# Patient Record
Sex: Male | Born: 1945
Health system: Southern US, Community
[De-identification: ages and names within clinical notes are randomized; demographics above are authoritative.]

## PROBLEM LIST (undated history)

## (undated) DIAGNOSIS — J189 Pneumonia, unspecified organism: Secondary | ICD-10-CM

## (undated) DIAGNOSIS — M109 Gout, unspecified: Secondary | ICD-10-CM

## (undated) DIAGNOSIS — G4730 Sleep apnea, unspecified: Secondary | ICD-10-CM

## (undated) DIAGNOSIS — E119 Type 2 diabetes mellitus without complications: Secondary | ICD-10-CM

## (undated) DIAGNOSIS — H409 Unspecified glaucoma: Secondary | ICD-10-CM

## (undated) DIAGNOSIS — M1990 Unspecified osteoarthritis, unspecified site: Secondary | ICD-10-CM

## (undated) DIAGNOSIS — K279 Peptic ulcer, site unspecified, unspecified as acute or chronic, without hemorrhage or perforation: Secondary | ICD-10-CM

## (undated) DIAGNOSIS — K760 Fatty (change of) liver, not elsewhere classified: Secondary | ICD-10-CM

## (undated) DIAGNOSIS — I10 Essential (primary) hypertension: Secondary | ICD-10-CM

## (undated) DIAGNOSIS — C801 Malignant (primary) neoplasm, unspecified: Secondary | ICD-10-CM

## (undated) HISTORY — DX: Malignant (primary) neoplasm, unspecified: C80.1

## (undated) HISTORY — DX: Essential (primary) hypertension: I10

## (undated) HISTORY — PX: PROSTATE SURGERY: SHX751

## (undated) HISTORY — DX: Gout, unspecified: M10.9

## (undated) HISTORY — PX: TONSILLECTOMY: SUR1361

## (undated) HISTORY — DX: Peptic ulcer, site unspecified, unspecified as acute or chronic, without hemorrhage or perforation: K27.9

---

## 2007-08-23 ENCOUNTER — Encounter (HOSPITAL_COMMUNITY): Admission: RE | Admit: 2007-08-23 | Discharge: 2007-10-24 | Payer: Self-pay | Admitting: Urology

## 2007-09-14 ENCOUNTER — Inpatient Hospital Stay (HOSPITAL_COMMUNITY): Admission: RE | Admit: 2007-09-14 | Discharge: 2007-09-19 | Payer: Self-pay | Admitting: Urology

## 2007-09-14 ENCOUNTER — Encounter (INDEPENDENT_AMBULATORY_CARE_PROVIDER_SITE_OTHER): Payer: Self-pay | Admitting: Urology

## 2010-07-08 NOTE — Op Note (Signed)
Todd Gates, Todd Gates                ACCOUNT NO.:  [id number]   MEDICAL RECORD NO.:  [id number]          PATIENT TYPE:  INP   LOCATION:  0003                         FACILITY:  [facility]   PHYSICIAN:  Valetta Fuller, M.D.  DATE OF BIRTH:  May 18, 1945   DATE OF PROCEDURE:  09/14/2007  DATE OF DISCHARGE:                               OPERATIVE REPORT   PREOPERATIVE DIAGNOSIS:  Clinical stage T1C adenocarcinoma of the  prostate, intermediate to high risk.   POSTOPERATIVE DIAGNOSIS:  Clinical stage T1C adenocarcinoma of the  prostate, intermediate to high risk.   PROCEDURE PERFORMED:  Robotic assisted laparoscopic radical retropubic  prostatectomy with bilateral pelvic lymph node dissection.   SURGEON:  Valetta Fuller, M.D.   ASSISTANT:  Dr. Gaynelle Arabian.   ANESTHESIA:  General endotracheal.   INDICATIONS:  Todd Gates is a 65 year old male.  He originally saw Dr.  Larey Dresser in our office approximately 6 months ago.  At that time,  he had a palpable abnormality in the right lobe of his prostate.  This  was suspicious for adenocarcinoma of the prostate.  The patient  requested that diagnostic studies be done at the [place] because of cost  concerns.  He was sent to the [facility] clinic.  There, a PSA was  elevated in the 15-20 range.  An ultrasound and biopsy of his prostate  was performed.  The patient had low to moderate volume Gleason's 4 + 3 =  7 adenocarcinoma involving the right lobe of the prostate and low-volume  Gleason's 3 + 3 = 6 cancer involving the left lobe of his prostate.  He  went back to see Dr. Vonita Moss.  No additional imaging studies were done  at that time.  The patient was sent to me for consideration of robotic  prostatectomy.  We requested a bone scan be done which was done and did  not reveal any evidence of metastatic disease.  The patient underwent  extensive consultation with regard to treatment options for management  of this.  After careful discussion and  consideration, the patient  elected to proceed with robotic prostatectomy.  He appeared to  understand the advantages, disadvantages and complications associated  with this surgery.  He was felt to be potentially a candidate only for  unilateral nerve sparing on the left side and appeared to understand  that relatively high risk of sexual dysfunction.  We also discussed  incontinence issues.  He presents now for the procedure.   TECHNIQUE AND FINDINGS:  The patient had placement of compression boots  and received perioperative Unasyn.  He was brought to the operating room  where he had successful induction of general endotracheal anesthesia.  He was placed in a mid lithotomy position.  All extremities were  carefully padded.  The patient was secured to the operative table and  then placed in steep Trendelenburg position.  He was then prepped and  draped in the usual manner.  A Foley catheter was inserted sterilely on  the field.  Initial camera port incision was chosen just to the left of  the umbilicus  18 cm above the pubic symphysis and a standard open Hassan  technique was utilized.  No abdominal adhesions were palpated and trocar  placement and abdominal insufflation occurred without difficulty.  All  other trocars were placed with direct visual guidance.  This included 12  and 5 mm assist ports and three 8 mm robotic ports.  With positioning of  the ports, the surgical cart was then docked.   Bladder was distended for identification and the space of Retzius was  then developed utilizing electrocautery scissors.  Superficial fat off  the endopelvic fascia and bladder neck region, as well as the  superficial dorsal vein, were then dealt with.  Endopelvic fascia was  then incised from base to apex.  Levator musculature was swept off the  apex of the prostate and the dorsal venous complex was isolated and then  stapled with an ETS stapling device.  Hemostasis was excellent.  The   anterior bladder neck was identified with the aid of the Foley balloon  and electrocautery scissors were used to incise the anterior bladder  neck down to the Foley catheter which was then brought out of the  bladder neck and used for anterior retraction.  Indigo carmine was given  and we were well away from the ureteral orifices.  Posterior bladder  neck was then transected and the vas deferens and seminal vesicles were  identified.  These dissected free without obvious evidence of disease  outside the prostate.  All these structures were individually dissected  free and then retracted anteriorly.  Posterior plane between the rectum  and prostate was then established.   Attention was then turned towards nerve sparing.  This was done on the  left side only.  Superficial fascia on the prostate was incised and then  swept identifying the neurovascular bundle which was taken off from base  to apex of the prostate.  We then isolated the left-sided pedicle which  was taken with Hem-o-lok clips.  On the right side, we Hem-o-lok clips  to take as much tissue laterally as possible out to the apex of the  prostate.  The urethral stump was then transected, the Foley catheter  was removed and the posterior urethra transected.  The prostate specimen  was then brought out of the pelvis.  The pelvis was copiously irrigated.  The rectum was insufflated and no evidence of rectal injury was noted.   Attention was then turned towards bilateral pelvic lymph node  dissection.  Lymph node tissue within the obturator fossa extending up  to the bifurcation of the external iliac artery were taken.  Obturator  nerves were identified bilaterally as was the vasculature and the  obturator space.  Small clips were used for lymphatic channels and small  veins.  These packets were removed and will be examined for permanent  sectioning only.   Attention was then turned towards reconstruction.  The bladder neck did   not require reconstruction.  Posteriorly, the 6 o'clock bladder neck  position and posterior urethra were reapproximated utilizing interrupted  2-0 Vicryl suture.  The rest of the anastomosis was done with a double-  armed Monocryl suture in a 360 degree manner.  Foley catheter was  inserted and irrigation revealed no obvious extravasation.  The pelvic  drain was placed through one of the robotic ports and placed in the  retropubic space.  The prostate specimen was placed in an Endopouch bag.  The 12 mm assist port was closed with a 0 Vicryl  suture with the aid of  a suture passer.  All other trocars were taken out with direct vision.  The camera port incision was extended slightly to allow for removal of  the specimen and then closed with a #1 Vicryl suture.  All wounds were  infiltrated with Marcaine and then closed with clips.  The patient  appeared to tolerate the procedure well.   ESTIMATED BLOOD LOSS:  Around 200 mL.   COMPLICATIONS:  No obvious complications or problems occurred.   The patient was brought to the recovery room in stable condition.           ______________________________  Valetta Fuller, M.D.  Electronically Signed     DSG/MEDQ  D:  09/14/2007  T:  09/14/2007  Job:  [id number]

## 2010-07-11 NOTE — Discharge Summary (Signed)
NAMERASHAWD, LASKARIS                ACCOUNT NO.:  [id number]   MEDICAL RECORD NO.:  [id number]          PATIENT TYPE:  INP   LOCATION:  1434                         FACILITY:  [facility]   PHYSICIAN:  Valetta Fuller, M.D.  DATE OF BIRTH:  [birth year]   DATE OF ADMISSION:  09/14/2007  DATE OF DISCHARGE:  09/19/2007                               DISCHARGE SUMMARY   DISCHARGE DIAGNOSES:  1. Adenocarcinoma of the prostate.  2. Hypertension.  3. Temporary renal insufficiency.   PROCEDURE PERFORMED:  Robotic assisted laparoscopic radical retropubic  prostatectomy with bilateral pelvic lymph node dissection on September 14, 2007.   HOSPITAL COURSE:  [name] is a 65 year old male.  He was diagnosed  with higher risk clinical stage T1C adenocarcinoma of the prostate.  He  has had a PSA that has been elevated in the 15-20 range.  He was felt to  have moderate volume Gleason score + or = 7 cancer involving the right  lobe of his prostate and lower volume Gleason 6 disease involving the  left lobe of his prostate.  Metastatic workup was negative.  The patient  has undergone extensive counseling with regard to treatment options and  had chosen a robotic prostatectomy.  Preop assessment was otherwise  unremarkable.   On September 14, 2007 the patient underwent robotic assisted laparoscopic  radical retropubic prostatectomy with bilateral pelvic lymph node  dissections.  The surgery itself was unremarkable and there did not  appear to be any significant complications or problems.  Blood loss was  200 mL.  The patient's initial postoperative situation was relatively  unremarkable.  On postop day #1, however, he was noted to have  substantially reduced urinary output.  The patient's baseline creatinine  which had been normal at 1.2 was noted to be 3.1.  Hemoglobin was fairly  stable at 14.3.  There had not been good recording of urinary output on  the nurses record, but JP output appeared to be 100 mL.  The  patient  subsequently was given a fluid bolus.  He was monitored carefully over  the next 24 hours, but continued to have high outputs from his JP with  intermediate higher volume urine production out of the catheter.  His  creatinine continued to slowly rise.  Overall, the patient appeared to  have substantial urinary output.  It just appeared that at times the  majority of his urinary production appeared to be coming out of the JP,  indicating an anastomotic leak.  JP fluid was indeed found to be  consistent with urine based on creatinine levels.  The patient  clinically did well other than a mild ileus.  When the urinary output  decreased further from the Foley catheter, attempts at irrigation were  made with little return of the fluid.  There was some concern over the  possibility of a malpositioned catheter, but a CT cystogram showed the  Foley to be well positioned with some extravasation of contrast at the  bladder neck.  Slowly over time the urinary output started to come out  primarily from the Foley  catheter and the JP output continued to  decrease.  The patient's creatinine continued to improve.  Clinically he  began tolerating a liquid diet well.  He did have a prolonged ileus but  that subsequently resolved.  On postoperative day #5, the patient was  really without complaints.  He had a bowel movement and was tolerating a  general diet well.  His creatinine had improved to 1.6.  JP output at  that point became minimal and all his urinary output was coming out of  the Foley catheter.  At that point we felt it prudent to go ahead and  allow him to be discharged home.   DISPOSITION:  The patient was discharged home with an indwelling Foley  catheter after leg bag teaching.  He was instructed to resume a normal  diet with routine postoperative instructions given.  He was given a  prescription for pain medication.  He will follow up in our office in  approximately 10 days for  reassessment and sooner as required.      Valetta Fuller, M.D.  Electronically Signed     DSG/MEDQ  D:  11/09/2007  T:  11/10/2007  Job:  [id number]

## 2010-07-11 NOTE — H&P (Signed)
Todd Gates, Todd Gates                ACCOUNT NO.:  [id number]   MEDICAL RECORD NO.:  [id number]          PATIENT TYPE:  INP   LOCATION:  1434                         FACILITY:  [facility]   PHYSICIAN:  Valetta Fuller, M.D.  DATE OF BIRTH:  1946-01-03   DATE OF ADMISSION:  09/14/2007  DATE OF DISCHARGE:  09/19/2007                              HISTORY & PHYSICAL   ADMISSION DIAGNOSIS:  Adenocarcinoma of the prostate.   HISTORY OF PRESENT ILLNESS:  Todd Gates is a 65 year old male.  He  presents today for robotic assisted laparoscopic radical retropubic  prostatectomy and bilateral pelvic lymph node dissection.  The patient  has high risk clinical stage T1c adenocarcinoma of the prostate and will  hopefully be admitted for routine postoperative care status post that  procedure.   The patient was diagnosed originally with adenocarcinoma of the prostate  at the [place] [facility].  He had originally been seen by Dr. Larey Dresser  with an elevated PSA in the 15-20 range.  The patient eventually had  ultrasound and biopsy performed in the [place] system.  The patient had  moderate volume Gleason 4 plus 3 equals 7 tumor involving the right lobe  of the prostate and low volume Gleason 3 plus 3 equals 6 tumor involving  the left lobe of the prostate.  The patient had undergone extensive  discussion about treatment options.  He elected to proceed with a  robotic prostatectomy.  Preoperative bone scan failed to reveal any  metastatic disease.  The patient really has minimal voiding complaints  at this time.  He is otherwise essentially asymptomatic.   PAST MEDICAL HISTORY:  Is notable for hypertension.  The patient also  has a diagnosis of glaucoma.   PREOPERATIVE MEDICATIONS:  Include hydrochlorothiazide 25 mg p.o. daily.   ALLERGIES:  The patient had allergies or intolerances to ASPIRIN and  CONTRAST MEDIA.   SOCIAL HISTORY:  The patient is a nonsmoker.   REVIEW OF SYSTEMS:  Negative for fever, chills,  abdominal pain, bone  discomfort, shortness of breath, chest pain.   PHYSICAL EXAMINATION:  GENERAL:  Todd Gates is a well-developed, well-  nourished male.  NECK:  Showed no evidence of JVD or obvious masses.  LUNGS:  Respirations were normal effort with normal breath sounds.  HEART:  Regular rate and rhythm.  ABDOMEN:  Abdomen is slightly protuberant, soft and nontender.  No  palpable masses.  No hepatosplenomegaly.  EXTERNAL GENITALIA:  Shows normal penis, testes and adnexal structures.  RECTAL:  Normal tone.  No obvious masses.  Prostate 1+ without palpable  nodules.  EXTREMITIES:  Without edema.   DATA:  The patient's preoperative hemoglobin was 15.4 with a white blood  cell count of 7.4 thousand.  The patient's preoperative BUN and  creatinine are 13 and 1.2 respectively.   ASSESSMENT:  High risk clinical stage T1c adenocarcinoma of the  prostate.  The patient is to undergo robotic assisted laparoscopic  radical retropubic prostatectomy with bilateral lymph node dissection  today and hopefully will be admitted for routine postoperative care.      Todd Gates.  Isabel Caprice, M.D.  Electronically Signed     DSG/MEDQ  D:  11/09/2007  T:  11/10/2007  Job:  [id number]

## 2010-11-21 LAB — BASIC METABOLIC PANEL
BUN: 24 — ABNORMAL HIGH
BUN: 26 — ABNORMAL HIGH
BUN: 27 — ABNORMAL HIGH
CO2: 24
CO2: 24
CO2: 26
CO2: 29
CO2: 29
Calcium: 8.2 — ABNORMAL LOW
Calcium: 8.7
Calcium: 8.9
Calcium: 9.9
Chloride: 100
Chloride: 101
Chloride: 103
Chloride: 98
Creatinine, Ser: 1.18
Creatinine, Ser: 1.64 — ABNORMAL HIGH
Creatinine, Ser: 3.01 — ABNORMAL HIGH
Creatinine, Ser: 3.15 — ABNORMAL HIGH
GFR calc Af Amer: 24 — ABNORMAL LOW
GFR calc Af Amer: 26 — ABNORMAL LOW
GFR calc Af Amer: >60
GFR calc non Af Amer: 20 — ABNORMAL LOW
GFR calc non Af Amer: 43 — ABNORMAL LOW
Glucose, Bld: 125 — ABNORMAL HIGH
Glucose, Bld: 126 — ABNORMAL HIGH
Glucose, Bld: 129 — ABNORMAL HIGH
Glucose, Bld: 144 — ABNORMAL HIGH
Potassium: 4.3
Potassium: 4.3
Potassium: 4.8
Sodium: 133 — ABNORMAL LOW
Sodium: 135
Sodium: 135

## 2010-11-21 LAB — DIFFERENTIAL
Lymphocytes Relative: 10 — ABNORMAL LOW
Lymphs Abs: 1.3
Monocytes Relative: 9
Neutro Abs: 11 — ABNORMAL HIGH
Neutrophils Relative %: 81 — ABNORMAL HIGH

## 2010-11-21 LAB — ABO/RH: ABO/RH(D): O POS

## 2010-11-21 LAB — CBC
HCT: 38.9 — ABNORMAL LOW
Hemoglobin: 13.5
MCHC: 34.3
MCHC: 34.8
MCV: 91.5
RBC: 4.55
RBC: 4.9
RDW: 14.1
RDW: 14.3
WBC: 13.5 — ABNORMAL HIGH

## 2010-11-21 LAB — TYPE AND SCREEN: ABO/RH(D): O POS

## 2013-07-11 ENCOUNTER — Ambulatory Visit: Payer: Self-pay | Admitting: Orthopedic Surgery

## 2013-07-13 ENCOUNTER — Ambulatory Visit (INDEPENDENT_AMBULATORY_CARE_PROVIDER_SITE_OTHER): Payer: Medicare Other

## 2013-07-13 ENCOUNTER — Encounter: Payer: Self-pay | Admitting: Orthopedic Surgery

## 2013-07-13 ENCOUNTER — Ambulatory Visit (INDEPENDENT_AMBULATORY_CARE_PROVIDER_SITE_OTHER): Payer: Medicare Other | Admitting: Orthopedic Surgery

## 2013-07-13 VITALS — BP 127/66 | Ht 68.0 in | Wt 204.0 lb

## 2013-07-13 DIAGNOSIS — M25569 Pain in unspecified knee: Secondary | ICD-10-CM

## 2013-07-13 DIAGNOSIS — M25561 Pain in right knee: Secondary | ICD-10-CM

## 2013-07-13 DIAGNOSIS — M1712 Unilateral primary osteoarthritis, left knee: Secondary | ICD-10-CM | POA: Insufficient documentation

## 2013-07-13 DIAGNOSIS — IMO0002 Reserved for concepts with insufficient information to code with codable children: Secondary | ICD-10-CM

## 2013-07-13 DIAGNOSIS — M1710 Unilateral primary osteoarthritis, unspecified knee: Secondary | ICD-10-CM

## 2013-07-13 DIAGNOSIS — M179 Osteoarthritis of knee, unspecified: Secondary | ICD-10-CM

## 2013-07-13 MED ORDER — DICLOFENAC SODIUM 50 MG PO TBEC: 50.0000 mg | DELAYED_RELEASE_TABLET | Freq: Two times a day (BID) | ORAL | Status: DC

## 2013-07-13 NOTE — Patient Instructions (Signed)
Pick up meds from pharmacy   Osteoarthritis Osteoarthritis is a disease that causes soreness and swelling (inflammation) of a joint. It occurs when the cartilage at the affected joint wears down. Cartilage acts as a cushion, covering the ends of bones where they meet to form a joint. Osteoarthritis is the most common form of arthritis. It often occurs in older people. The joints affected most often by this condition include those in the:  Ends of the fingers.  Thumbs.  Neck.  Lower back.  Knees.  Hips. CAUSES  Over time, the cartilage that covers the ends of bones begins to wear away. This causes bone to rub on bone, producing pain and stiffness in the affected joints.  RISK FACTORS Certain factors can increase your chances of having osteoarthritis, including:  Older age.  Excessive body weight.  Overuse of joints. SIGNS AND SYMPTOMS   Pain, swelling, and stiffness in the joint.  Over time, the joint may lose its normal shape.  Small deposits of bone (osteophytes) may grow on the edges of the joint.  Bits of bone or cartilage can break off and float inside the joint space. This may cause more pain and damage. DIAGNOSIS  Your health care provider will do a physical exam and ask about your symptoms. Various tests may be ordered, such as:  X-rays of the affected joint.  An MRI scan.  Blood tests to rule out other types of arthritis.  Joint fluid tests. This involves using a needle to draw fluid from the joint and examining the fluid under a microscope. TREATMENT  Goals of treatment are to control pain and improve joint function. Treatment plans may include:  A prescribed exercise program that allows for rest and joint relief.  A weight control plan.  Pain relief techniques, such as:  Properly applied heat and cold.  Electric pulses delivered to nerve endings under the skin (transcutaneous electrical nerve stimulation, TENS).  Massage.  Certain nutritional  supplements.  Medicines to control pain, such as:  Acetaminophen.  Nonsteroidal anti-inflammatory drugs (NSAIDs), such as naproxen.  Narcotic or central-acting agents, such as tramadol.  Corticosteroids. These can be given orally or as an injection.  Surgery to reposition the bones and relieve pain (osteotomy) or to remove loose pieces of bone and cartilage. Joint replacement may be needed in advanced states of osteoarthritis. HOME CARE INSTRUCTIONS   Only take over-the-counter or prescription medicines as directed by your health care provider. Take all medicines exactly as instructed.  Maintain a healthy weight. Follow your health care provider's instructions for weight control. This may include dietary instructions.  Exercise as directed. Your health care provider can recommend specific types of exercise. These may include:  Strengthening exercises These are done to strengthen the muscles that support joints affected by arthritis. They can be performed with weights or with exercise bands to add resistance.  Aerobic activities These are exercises, such as brisk walking or low-impact aerobics, that get your heart pumping.  Range-of-motion activities These keep your joints limber.  Balance and agility exercises These help you maintain daily living skills.  Rest your affected joints as directed by your health care provider.  Follow up with your health care provider as directed. SEEK MEDICAL CARE IF:   Your skin turns red.  You develop a rash in addition to your joint pain.  You have worsening joint pain. SEEK IMMEDIATE MEDICAL CARE IF:  You have a significant loss of weight or appetite.  You have a fever along with  joint or muscle aches.  You have night sweats. [place] of Arthritis and Musculoskeletal and Skin Diseases: www.niams.SouthExposed.es [employer] on Aging: http://kim-miller.com/ American College of Rheumatology:  www.rheumatology.org Document Released: 02/09/2005 Document Revised: 11/30/2012 Document Reviewed: 10/17/2012 [facility] Patient Information 2014 [place], [place].

## 2013-07-13 NOTE — Progress Notes (Signed)
Patient ID: Todd Gates, male   DOB: [birth year], 68 y.o.   MRN: [id number]  Chief complaint pain right knee for several years  Previous treatment one cortisone injection and 1 steroid Dosepak.  The patient denies any trauma complained of aching and stiffness especially when driving with pain over the front of the knee. Denies catching locking giving way or mechanical symptoms  Vital signs: BP 127/66  Ht 5\' 8"  (1.727 m)  Wt 204 lb (92.534 kg)  BMI 31.03 kg/m2   General the patient is well-developed and well-nourished grooming and hygiene are normal Oriented x3 Mood and affect normal Ambulation normal; Inspection of the right knee  Range of motion test shows slight flexion contracture but normal flexion. There is no joint effusion. Mild tenderness over the medial joint line is noted. All joints are stable Motor exam is normal Skin clean dry and intact   Left knee inspection reveals no swelling, slight flexion contracture and range of motion test. Knee joint is stable. Muscle strength is 5 over 5.  Cardiovascular exam is normal Sensory exam normal   X-ray show mild to moderate arthritis of the knee  Impression osteoarthritis of the knee  Plan Start oral anti-inflammatories followup as needed no knee replacement needed at this time.  Meds ordered this encounter  Medications  . diclofenac (VOLTAREN) 50 MG EC tablet    Sig: Take 1 tablet (50 mg total) by mouth 2 (two) times daily.    Dispense:  60 tablet    Refill:  2

## 2014-03-08 LAB — HEPATIC FUNCTION PANEL
ALK PHOS: 82 U/L (ref 25–125)
ALT: 17 U/L (ref 10–40)
AST: 22 U/L (ref 14–40)
BILIRUBIN, TOTAL: 0.5 mg/dL

## 2015-03-18 LAB — HEPATIC FUNCTION PANEL
ALK PHOS: 78 U/L (ref 25–125)
ALK PHOS: 78 U/L (ref 25–125)
ALT: 16 U/L (ref 10–40)
ALT: 21 U/L (ref 10–40)
AST: 16 U/L (ref 14–40)
AST: 21 U/L (ref 14–40)
BILIRUBIN, TOTAL: 0.5 mg/dL
Bilirubin, Total: 0.5 mg/dL

## 2015-09-16 LAB — HEPATIC FUNCTION PANEL
ALK PHOS: 84 U/L (ref 25–125)
ALT: 20 U/L (ref 10–40)
AST: 29 U/L (ref 14–40)
Bilirubin, Total: 0.5 mg/dL

## 2015-12-04 ENCOUNTER — Ambulatory Visit (INDEPENDENT_AMBULATORY_CARE_PROVIDER_SITE_OTHER): Payer: Medicare Other | Admitting: Orthopedic Surgery

## 2015-12-04 DIAGNOSIS — M7662 Achilles tendinitis, left leg: Secondary | ICD-10-CM

## 2015-12-18 ENCOUNTER — Ambulatory Visit (INDEPENDENT_AMBULATORY_CARE_PROVIDER_SITE_OTHER): Payer: Medicare Other | Admitting: Family

## 2015-12-18 VITALS — Ht 68.0 in | Wt 214.0 lb

## 2015-12-18 DIAGNOSIS — M1A0720 Idiopathic chronic gout, left ankle and foot, without tophus (tophi): Secondary | ICD-10-CM | POA: Diagnosis not present

## 2015-12-18 DIAGNOSIS — M6702 Short Achilles tendon (acquired), left ankle: Secondary | ICD-10-CM

## 2015-12-18 DIAGNOSIS — M722 Plantar fascial fibromatosis: Secondary | ICD-10-CM

## 2015-12-18 NOTE — Progress Notes (Signed)
Office Visit Note   Patient: Todd Gates           Date of Birth: 01-08-1946           MRN: [id number] Visit Date: 12/18/2015              Requested by: Glenda Chroman, MD [address] [place], [place] [address] PCP: Glenda Chroman, MD   Assessment & Plan: Visit Diagnoses:  1. Plantar fasciitis of left foot   2. Contracture of left Achilles tendon   3. Idiopathic chronic gout of left ankle without tophus     Plan: Will obtain orthotics, recommended Sole orthotics for daily wear. Will work on heel cord stretching. Declined cortisone injection today. Will follow up in early December. Will consider injection if no improvement at that time.   Follow-Up Instructions: Return in about 5 weeks (around 01/24/2016).   Orders:  No orders of the defined types were placed in this encounter.  No orders of the defined types were placed in this encounter.     Procedures: No procedures performed   Clinical Data: No additional findings.   Subjective: Chief Complaint  Patient presents with  . Left Foot - Pain    Left heel pain pt has not had any gout flare since taking medication is on uloric 40mg  1 po qd and his last level was 5.7 on 09/17/15. He is in regular shoe wear and full wtb. Pt states that the [place] gave him a steroid does pack and that this helped but he is still tender.   Patient continues to have left heel pain which is posterior lateral. Gout continues to be well managed with [facility] [place] and colchicine for flares. He has previously been given orthotics from the [place], which he states he could not tolerate. Does relate having good relief of pain following a steroid Dosepak. Since completing this the pain has returned.  Review of Systems  Constitutional: Negative for chills and fever.     Objective: Vital Signs: Ht 5\' 8"  (1.727 m)   Wt 214 lb (97.1 kg)   BMI 32.54 kg/m   Physical Exam  Ortho Exam   Left foot: Is point tender to posterior lateral calcaneus. No pain or tenderness  across arch or insertion of plantar fascia. No pain with calcaneal squeeze. Does have a heel cord contracture with dorsiflexion to neutral. Ankle is nontender. Has good subtalar motion. Strong dorsalis pedis pulse  Specialty Comments:  No specialty comments available.  Imaging: No results found.   PMFS History: Patient Active Problem List   Diagnosis Date Noted  . OA (osteoarthritis) of knee 07/13/2013   Past Medical History:  Diagnosis Date  . Cancer [facility])    prostate  . Gout   . HTN (hypertension)   . Peptic ulcer disease     Family History  Problem Relation Age of Onset  . Hypertension      Past Surgical History:  Procedure Laterality Date  . PROSTATE SURGERY     Social History   Occupational History  . Not on file.   Social History Main Topics  . Smoking status: Never Smoker  . Smokeless tobacco: Not on file  . Alcohol use No  . Drug use: No  . Sexual activity: Not on file          Office Visit Note   Patient: Todd Gates           Date of Birth: 11-02-1945  MRN: [id number] Visit Date:               Requested by: Glenda Chroman, MD [address] [place], [place] [address] PCP: Glenda Chroman, MD   Assessment & Plan: Visit Diagnoses:  1. Plantar fasciitis   2. Plantar fasciitis of left foot   3. Contracture of left Achilles tendon   4. Idiopathic chronic gout of left ankle without tophus     Plan:   Follow-Up Instructions: Return in about 5 weeks (around 01/24/2016).   Orders:  No orders of the defined types were placed in this encounter.  No orders of the defined types were placed in this encounter.     Procedures: No procedures performed   Clinical Data: No additional findings.   Subjective: Chief Complaint  Patient presents with  . Left Foot - Pain    HPI  Review of Systems   Objective: Vital Signs: Ht 5\' 8"  (1.727 m)   Wt 214 lb (97.1 kg)   BMI 32.54 kg/m   Physical Exam  Ortho Exam  Specialty Comments:  No  specialty comments available.  Imaging: No results found.   PMFS History: Patient Active Problem List   Diagnosis Date Noted  . OA (osteoarthritis) of knee 07/13/2013   Past Medical History:  Diagnosis Date  . Cancer [facility])    prostate  . Gout   . HTN (hypertension)   . Peptic ulcer disease     Family History  Problem Relation Age of Onset  . Hypertension      Past Surgical History:  Procedure Laterality Date  . PROSTATE SURGERY     Social History   Occupational History  . Not on file.   Social History Main Topics  . Smoking status: Never Smoker  . Smokeless tobacco: Not on file  . Alcohol use No  . Drug use: No  . Sexual activity: Not on file

## 2015-12-24 ENCOUNTER — Telehealth: Payer: Self-pay | Admitting: Radiation Oncology

## 2015-12-24 NOTE — Telephone Encounter (Signed)
Patient would like to be seen in [place], referral forwarded to [facility] 12/24/15

## 2016-01-13 ENCOUNTER — Ambulatory Visit: Payer: Self-pay

## 2016-01-13 ENCOUNTER — Ambulatory Visit: Payer: Self-pay | Admitting: Radiation Oncology

## 2016-01-22 ENCOUNTER — Ambulatory Visit (INDEPENDENT_AMBULATORY_CARE_PROVIDER_SITE_OTHER): Payer: Medicare Other | Admitting: Family

## 2016-01-22 ENCOUNTER — Encounter (INDEPENDENT_AMBULATORY_CARE_PROVIDER_SITE_OTHER): Payer: Self-pay | Admitting: Family

## 2016-01-22 VITALS — Ht 68.0 in | Wt 214.0 lb

## 2016-01-22 DIAGNOSIS — M722 Plantar fascial fibromatosis: Secondary | ICD-10-CM

## 2016-01-22 NOTE — Progress Notes (Signed)
Office Visit Note   Patient: Todd Gates           Date of Birth: 1945-10-23           MRN: [id number] Visit Date: 01/22/2016              Requested by: Glenda Chroman, MD [address] [place], [place] [address] PCP: Glenda Chroman, MD   Assessment & Plan: Visit Diagnoses:  1. Plantar fasciitis     Plan: Plan to follow up in 4 more weeks if continued pain.  Follow-Up Instructions: Return in about 4 weeks (around 02/19/2016).   Orders:  Orders Placed This Encounter  Procedures  . Foot Injection   No orders of the defined types were placed in this encounter.     Procedures: Foot Inj Date/Time: 01/23/2016 9:57 AM Performed by: Dondra Prader RENEE Authorized by: Dondra Prader RENEE   Consent Given by:  Patient Site marked: the procedure site was marked   Timeout: prior to procedure the correct patient, procedure, and site was verified   Indications:  Fasciitis and pain Condition: Plantar Fasciitis   Location: left plantar fascia muscle   Prep: patient was prepped and draped in usual sterile fashion   Needle Size:  22 G Medications:  2 mL lidocaine 1 %; 40 mg methylPREDNISolone acetate 40 MG/ML Patient Tolerance:  Patient tolerated the procedure well with no immediate complications     Clinical Data: No additional findings.   Subjective: Chief Complaint  Patient presents with  . Left Foot - Pain    Left heel pain    Patient is a 70 year old gentleman seen today in follow-up for his plantar fasciitis left heel. Has tried custom orthotics and stretching. Has also attempted NSAIDs with minimal relief. States is ready to try steroid injection. Continues to have pain which is worse with start up. Is also worse morning.    Review of Systems  Constitutional: Negative for chills and fever.     Objective: Vital Signs: Ht 5\' 8"  (1.727 m)   Wt 214 lb (97.1 kg)   BMI 32.54 kg/m   Physical Exam  Constitutional: He is oriented to person, place, and time. He appears  well-developed and well-nourished.  Pulmonary/Chest: Effort normal.  Musculoskeletal:  Pain in the arch. Tenderness at the origin of plantar fascia. Does have mild tenderness with calcaneal squeeze. Achilles is nontender. No palpable cords or defects. Full dorsiflexion  Neurological: He is alert and oriented to person, place, and time.  Psychiatric: He has a normal mood and affect.  Nursing note reviewed.   Ortho Exam  Specialty Comments:  No specialty comments available.  Imaging: No results found.   PMFS History: Patient Active Problem List   Diagnosis Date Noted  . Plantar fasciitis 01/23/2016  . OA (osteoarthritis) of knee 07/13/2013   Past Medical History:  Diagnosis Date  . Cancer [facility])    prostate  . Gout   . HTN (hypertension)   . Peptic ulcer disease     Family History  Problem Relation Age of Onset  . Hypertension      Past Surgical History:  Procedure Laterality Date  . PROSTATE SURGERY     Social History   Occupational History  . Not on file.   Social History Main Topics  . Smoking status: Never Smoker  . Smokeless tobacco: Not on file  . Alcohol use No  . Drug use: No  . Sexual activity: Not on file

## 2016-01-23 DIAGNOSIS — M722 Plantar fascial fibromatosis: Secondary | ICD-10-CM | POA: Diagnosis not present

## 2016-01-23 MED ORDER — LIDOCAINE HCL 1 % IJ SOLN
2.0000 mL | INTRAMUSCULAR | Status: AC | PRN
  Administered 2016-01-23: 2 mL

## 2016-01-23 MED ORDER — METHYLPREDNISOLONE ACETATE 40 MG/ML IJ SUSP
40.0000 mg | INTRAMUSCULAR | Status: AC | PRN
  Administered 2016-01-23: 40 mg

## 2016-02-21 ENCOUNTER — Ambulatory Visit (INDEPENDENT_AMBULATORY_CARE_PROVIDER_SITE_OTHER): Payer: Medicare Other | Admitting: Family

## 2016-02-21 ENCOUNTER — Ambulatory Visit (INDEPENDENT_AMBULATORY_CARE_PROVIDER_SITE_OTHER): Payer: Medicare Other | Admitting: Orthopedic Surgery

## 2016-02-21 ENCOUNTER — Encounter (INDEPENDENT_AMBULATORY_CARE_PROVIDER_SITE_OTHER): Payer: Self-pay | Admitting: Orthopedic Surgery

## 2016-02-21 DIAGNOSIS — M6702 Short Achilles tendon (acquired), left ankle: Secondary | ICD-10-CM | POA: Diagnosis not present

## 2016-02-21 MED ORDER — LIDOCAINE HCL 1 % IJ SOLN
2.0000 mL | INTRAMUSCULAR | Status: AC | PRN
  Administered 2016-02-21: 2 mL

## 2016-02-21 MED ORDER — METHYLPREDNISOLONE ACETATE 40 MG/ML IJ SUSP
40.0000 mg | INTRAMUSCULAR | Status: AC | PRN
  Administered 2016-02-21: 40 mg via INTRA_ARTICULAR

## 2016-02-21 NOTE — Progress Notes (Signed)
Office Visit Note   Patient: Todd Gates           Date of Birth: 02-15-1946           MRN: [id number] Visit Date: 02/21/2016              Requested by: Glenda Chroman, MD [address] [place], [place] [address] PCP: Glenda Chroman, MD   Assessment & Plan: Visit Diagnoses:  1. Achilles tendon contracture, left    Insertional left Achilles tendinitis Plan: Patient's retro-Achilles bursa was injected with 1 mL Depo-Medrol 40 mg and 1 mL of 1% lidocaine plain and he tolerated this well patient was given instructions for heel cord stretching to do daily. Follow-up as needed.  Follow-Up Instructions: Return if symptoms worsen or fail to improve.   Orders:  No orders of the defined types were placed in this encounter.  No orders of the defined types were placed in this encounter.     Procedures: Medium Joint Inj Date/Time: 02/21/2016 11:16 AM Performed by: DUDA, MARCUS V Authorized by: Newt Minion   Consent Given by:  Patient Site marked: the procedure site was marked   Timeout: prior to procedure the correct patient, procedure, and site was verified   Indications:  Pain and diagnostic evaluation Location:  Ankle Site:  L ankle Prep: patient was prepped and draped in usual sterile fashion   Needle Size:  22 G Needle Length:  1.5 inches Approach:  Posterior Ultrasound Guided: No   Fluoroscopic Guidance: No   Medications:  2 mL lidocaine 1 %; 40 mg methylPREDNISolone acetate 40 MG/ML Aspiration Attempted: No   Patient tolerance:  Patient tolerated the procedure well with no immediate complications     Clinical Data: No additional findings.   Subjective: Chief Complaint  Patient presents with  . Left Ankle - Follow-up    Plantar fascia pain follow up    Patient presents for follow up right plantar fascia pain. He is status post steroid injection 01/22/16. He did get some relief and wants to know if he can get a repeat injection today. He has tender and soreness medial  right heel.     Review of Systems   Objective: Vital Signs: There were no vitals taken for this visit.  Physical Exam examination patient is alert oriented no adenopathy well-dressed normal affect normal respiratory effort he does have an antalgic gait. Patient has good pulses. Patient has dorsiflexion to neutral with his knee extended. Patient's plantar fascia is nontender to palpation the insertion of the Achilles is tender to palpation and this is where patient is symptomatic. The peroneal posterior tibial tendon are nontender to palpation.  Ortho Exam  Specialty Comments:  No specialty comments available.  Imaging: No results found.   PMFS History: Patient Active Problem List   Diagnosis Date Noted  . Achilles tendon contracture, left 02/21/2016  . Plantar fasciitis 01/23/2016  . OA (osteoarthritis) of knee 07/13/2013   Past Medical History:  Diagnosis Date  . Cancer [facility])    prostate  . Gout   . HTN (hypertension)   . Peptic ulcer disease     Family History  Problem Relation Age of Onset  . Hypertension      Past Surgical History:  Procedure Laterality Date  . PROSTATE SURGERY     Social History   Occupational History  . Not on file.   Social History Main Topics  . Smoking status: Never Smoker  . Smokeless tobacco: Not on  file  . Alcohol use No  . Drug use: No  . Sexual activity: Not on file

## 2016-04-22 ENCOUNTER — Ambulatory Visit (INDEPENDENT_AMBULATORY_CARE_PROVIDER_SITE_OTHER): Payer: Medicare Other

## 2016-04-22 ENCOUNTER — Ambulatory Visit (INDEPENDENT_AMBULATORY_CARE_PROVIDER_SITE_OTHER): Payer: Medicare Other | Admitting: Family

## 2016-04-22 ENCOUNTER — Encounter (INDEPENDENT_AMBULATORY_CARE_PROVIDER_SITE_OTHER): Payer: Self-pay | Admitting: Family

## 2016-04-22 VITALS — Ht 68.0 in | Wt 214.0 lb

## 2016-04-22 DIAGNOSIS — M722 Plantar fascial fibromatosis: Secondary | ICD-10-CM | POA: Diagnosis not present

## 2016-04-22 DIAGNOSIS — M79672 Pain in left foot: Secondary | ICD-10-CM | POA: Diagnosis not present

## 2016-04-22 MED ORDER — METHYLPREDNISOLONE ACETATE 40 MG/ML IJ SUSP
40.0000 mg | INTRAMUSCULAR | Status: AC | PRN
  Administered 2016-04-22: 40 mg

## 2016-04-22 MED ORDER — LIDOCAINE HCL 1 % IJ SOLN
2.0000 mL | INTRAMUSCULAR | Status: AC | PRN
  Administered 2016-04-22: 2 mL

## 2016-04-22 NOTE — Progress Notes (Deleted)
   Office Visit Note   Patient: Todd Gates           Date of Birth: 03-17-1945           MRN: [id number] Visit Date: 04/22/2016              Requested by: Glenda Chroman, MD [address] [place], [place] [address] PCP: Glenda Chroman, MD  Chief Complaint  Patient presents with  . Left Foot - Pain    Heel pain    HPI: Left heel pain. The pt states that he has had pain for several months and that it is a bone spur. Patient states that he has had an injection in his heel in the past and that this was helpful and that he would like an a injection today. Pamella Pert, RMA    Assessment & Plan: Visit Diagnoses: No diagnosis found.  Plan: ***  Follow-Up Instructions: No Follow-up on file.   Ortho Exam ***  Imaging: No results found.  Orders:  No orders of the defined types were placed in this encounter.  No orders of the defined types were placed in this encounter.    Procedures: No procedures performed  Clinical Data: No additional findings.  Subjective: Review of Systems  Objective: Vital Signs: Ht 5\' 8"  (1.727 m)   Wt 214 lb (97.1 kg)   BMI 32.54 kg/m   Specialty Comments:  No specialty comments available.  PMFS History: Patient Active Problem List   Diagnosis Date Noted  . Achilles tendon contracture, left 02/21/2016  . Plantar fasciitis 01/23/2016  . OA (osteoarthritis) of knee 07/13/2013   Past Medical History:  Diagnosis Date  . Cancer [facility])    prostate  . Gout   . HTN (hypertension)   . Peptic ulcer disease     Family History  Problem Relation Age of Onset  . Hypertension      Past Surgical History:  Procedure Laterality Date  . PROSTATE SURGERY     Social History   Occupational History  . Not on file.   Social History Main Topics  . Smoking status: Unknown If Ever Smoked  . Smokeless tobacco: Not on file  . Alcohol use No  . Drug use: No  . Sexual activity: Not on file

## 2016-04-22 NOTE — Progress Notes (Signed)
Office Visit Note   Patient: Todd Gates           Date of Birth: [birth year]           MRN: [id number] Visit Date: 04/22/2016              Requested by: Glenda Chroman, MD [address] [place], [place] [address] PCP: Glenda Chroman, MD  Chief Complaint  Patient presents with  . Left Foot - Pain    Heel pain    HPI: Patient is a 71 year old gentleman seen today for evalutation of left heel pain. Wonders if he has a heel spur. Has a history of plantar fasciitis. States it has flared up again. Today is in new shoe wear. Has the [place] running shoes which were recommended to him for plantar fasciitis. Has had good relief in the past with depomedrol injections. Requests repeat injection today.     Assessment & Plan: Visit Diagnoses:  1. Plantar fasciitis   2. Pain in left foot     Plan: injection today. Instructed on continuing heel cord stretching. Will go to shoe market today and inquire about shoes with better arch support.   Follow-Up Instructions: Return if symptoms worsen or fail to improve.   Ortho Exam Physical Exam  Constitutional: Appears well-developed.  Head: Normocephalic.  Eyes: EOM are normal.  Neck: Normal range of motion.  Cardiovascular: Normal rate.   Pulmonary/Chest: Effort normal.  Neurological: Is alert.  Skin: Skin is warm.  Psychiatric: Has a normal mood and affect. Steady gait.  Left foot: Mild pes planus. Does have heel cord contracture with dorsiflexion to 90. Point tender over origin of plantar fasica. No pain with lateral compression of calcaneus. No erythema.   Imaging: Xr Foot Complete Left  Result Date: 04/22/2016 Radiographs of the left foot show calcaneal spurring. No acute abnormality.   Orders:  Orders Placed This Encounter  Procedures  . XR Foot Complete Left   No orders of the defined types were placed in this encounter.    Procedures: Foot Inj Date/Time: 04/22/2016 9:16 AM Performed by: Suzan Slick Authorized by: Dondra Prader R     Consent Given by:  Patient Site marked: the procedure site was marked   Timeout: prior to procedure the correct patient, procedure, and site was verified   Indications:  Fasciitis and pain Condition: Plantar Fasciitis   Location: left plantar fascia muscle   Prep: patient was prepped and draped in usual sterile fashion   Needle Size:  22 G Medications:  2 mL lidocaine 1 %; 40 mg methylPREDNISolone acetate 40 MG/ML Patient Tolerance:  Patient tolerated the procedure well with no immediate complications    Clinical Data: No additional findings.  Subjective: Review of Systems  Objective: Vital Signs: Ht 5\' 8"  (1.727 m)   Wt 214 lb (97.1 kg)   BMI 32.54 kg/m   Specialty Comments:  No specialty comments available.  PMFS History: Patient Active Problem List   Diagnosis Date Noted  . Achilles tendon contracture, left 02/21/2016  . Plantar fasciitis 01/23/2016  . OA (osteoarthritis) of knee 07/13/2013   Past Medical History:  Diagnosis Date  . Cancer [facility])    prostate  . Gout   . HTN (hypertension)   . Peptic ulcer disease     Family History  Problem Relation Age of Onset  . Hypertension      Past Surgical History:  Procedure Laterality Date  . PROSTATE SURGERY  Social History   Occupational History  . Not on file.   Social History Main Topics  . Smoking status: Unknown If Ever Smoked  . Smokeless tobacco: Not on file  . Alcohol use No  . Drug use: No  . Sexual activity: Not on file

## 2016-05-20 ENCOUNTER — Encounter (INDEPENDENT_AMBULATORY_CARE_PROVIDER_SITE_OTHER): Payer: Self-pay | Admitting: Family

## 2016-05-20 ENCOUNTER — Ambulatory Visit (INDEPENDENT_AMBULATORY_CARE_PROVIDER_SITE_OTHER): Payer: Medicare Other | Admitting: Family

## 2016-05-20 VITALS — Ht 68.0 in | Wt 214.0 lb

## 2016-05-20 DIAGNOSIS — M722 Plantar fascial fibromatosis: Secondary | ICD-10-CM

## 2016-05-20 NOTE — Progress Notes (Signed)
   Office Visit Note   Patient: Todd Gates           Date of Birth: [birth year]           MRN: [id number] Visit Date: 05/20/2016              Requested by: Glenda Chroman, MD [address] [place], [place] [address] PCP: Glenda Chroman, MD  Chief Complaint  Patient presents with  . Left Foot - Pain      HPI: The patient is a 71 year old gentleman who presents today for continued left heel pain. He has plantar fasciitis and did have a heel cord contracture on the left. He is cortisone injection on February 28. This worked for a little while. Pain did return. He has subsequently had custom orthotics fabricated. Did also get a shoe market and had some inserts made as well has some stiff plastic arch supports all of these are too painful to tolerate.  Assessment & Plan: Visit Diagnoses:  1. Plantar fasciitis     Plan: Provided him with some arch support hapads today. Voiced immediate relief. He will wear these continue working on heel cord stretching continue using anti-inflammatories as needed. Discussed that the heart or supports will just need to be used into them. Again I wearing them in our a day slowly work up to wearing them all day. Follow-Up Instructions: Return if symptoms worsen or fail to improve.   Ortho Exam Physical Exam  Constitutional: Appears well-developed.  Head: Normocephalic.  Eyes: EOM are normal.  Neck: Normal range of motion.  Cardiovascular: Normal rate.   Pulmonary/Chest: Effort normal.  Neurological: Is alert.  Skin: Skin is warm.  Psychiatric: Has a normal mood and affect. Steady gait. Left foot: Pes planus. Does have tenderness to origin of plantar fascia. Has dorsiflexion just past neutral. Achilles is nontende.r no defects.   Imaging: No results found.  Labs: Lab Results  Component Value Date   REPTSTATUS 09/21/2007 FINAL 09/19/2007   CULT NO GROWTH 09/19/2007    Orders:  No orders of the defined types were placed in this encounter.  No orders of  the defined types were placed in this encounter.    Procedures: No procedures performed  Clinical Data: No additional findings.  ROS: Review of Systems  Constitutional: Negative for chills and fever.  Musculoskeletal: Positive for myalgias. Negative for gait problem.    Objective: Vital Signs: Ht 5\' 8"  (1.727 m)   Wt 214 lb (97.1 kg)   BMI 32.54 kg/m   Specialty Comments:  No specialty comments available.  PMFS History: Patient Active Problem List   Diagnosis Date Noted  . Achilles tendon contracture, left 02/21/2016  . Plantar fasciitis 01/23/2016  . OA (osteoarthritis) of knee 07/13/2013   Past Medical History:  Diagnosis Date  . Cancer [facility])    prostate  . Gout   . HTN (hypertension)   . Peptic ulcer disease     Family History  Problem Relation Age of Onset  . Hypertension      Past Surgical History:  Procedure Laterality Date  . PROSTATE SURGERY     Social History   Occupational History  . Not on file.   Social History Main Topics  . Smoking status: Unknown If Ever Smoked  . Smokeless tobacco: Never Used  . Alcohol use No  . Drug use: No  . Sexual activity: Not on file

## 2017-04-28 DIAGNOSIS — C61 Malignant neoplasm of prostate: Secondary | ICD-10-CM | POA: Diagnosis not present

## 2017-04-28 DIAGNOSIS — N5231 Erectile dysfunction following radical prostatectomy: Secondary | ICD-10-CM | POA: Diagnosis not present

## 2017-04-28 DIAGNOSIS — N393 Stress incontinence (female) (male): Secondary | ICD-10-CM | POA: Diagnosis not present

## 2017-04-30 ENCOUNTER — Other Ambulatory Visit: Payer: Self-pay | Admitting: Urology

## 2017-04-30 DIAGNOSIS — C61 Malignant neoplasm of prostate: Secondary | ICD-10-CM

## 2017-05-04 ENCOUNTER — Encounter (HOSPITAL_COMMUNITY)
Admission: RE | Admit: 2017-05-04 | Discharge: 2017-05-04 | Disposition: A | Discharge status: Home / Self Care | Payer: Medicare HMO | Source: Ambulatory Visit | Attending: Urology | Admitting: Urology

## 2017-05-04 ENCOUNTER — Encounter (HOSPITAL_COMMUNITY): Payer: Self-pay

## 2017-05-04 DIAGNOSIS — C61 Malignant neoplasm of prostate: Secondary | ICD-10-CM | POA: Insufficient documentation

## 2017-05-04 MED ORDER — AXUMIN (FLUCICLOVINE F 18) INJECTION
9.8400 | Freq: Once | INTRAVENOUS | Status: AC
  Administered 2017-05-04: 9.84 via INTRAVENOUS

## 2017-05-10 DIAGNOSIS — Z789 Other specified health status: Secondary | ICD-10-CM | POA: Diagnosis not present

## 2017-05-10 DIAGNOSIS — E7800 Pure hypercholesterolemia, unspecified: Secondary | ICD-10-CM | POA: Diagnosis not present

## 2017-05-10 DIAGNOSIS — Z299 Encounter for prophylactic measures, unspecified: Secondary | ICD-10-CM | POA: Diagnosis not present

## 2017-05-10 DIAGNOSIS — I10 Essential (primary) hypertension: Secondary | ICD-10-CM | POA: Diagnosis not present

## 2017-05-10 DIAGNOSIS — Z6834 Body mass index (BMI) 34.0-34.9, adult: Secondary | ICD-10-CM | POA: Diagnosis not present

## 2017-05-10 DIAGNOSIS — J320 Chronic maxillary sinusitis: Secondary | ICD-10-CM | POA: Diagnosis not present

## 2017-05-20 DIAGNOSIS — R9721 Rising PSA following treatment for malignant neoplasm of prostate: Secondary | ICD-10-CM | POA: Diagnosis not present

## 2017-05-20 DIAGNOSIS — C61 Malignant neoplasm of prostate: Secondary | ICD-10-CM | POA: Diagnosis not present

## 2017-08-05 DIAGNOSIS — H409 Unspecified glaucoma: Secondary | ICD-10-CM | POA: Diagnosis not present

## 2017-08-05 DIAGNOSIS — E785 Hyperlipidemia, unspecified: Secondary | ICD-10-CM | POA: Diagnosis not present

## 2017-08-05 DIAGNOSIS — E669 Obesity, unspecified: Secondary | ICD-10-CM | POA: Diagnosis not present

## 2017-08-05 DIAGNOSIS — M109 Gout, unspecified: Secondary | ICD-10-CM | POA: Diagnosis not present

## 2017-08-05 DIAGNOSIS — E119 Type 2 diabetes mellitus without complications: Secondary | ICD-10-CM | POA: Diagnosis not present

## 2017-08-05 DIAGNOSIS — M1990 Unspecified osteoarthritis, unspecified site: Secondary | ICD-10-CM | POA: Diagnosis not present

## 2017-08-05 DIAGNOSIS — N529 Male erectile dysfunction, unspecified: Secondary | ICD-10-CM | POA: Diagnosis not present

## 2017-08-05 DIAGNOSIS — R32 Unspecified urinary incontinence: Secondary | ICD-10-CM | POA: Diagnosis not present

## 2017-08-05 DIAGNOSIS — G3184 Mild cognitive impairment, so stated: Secondary | ICD-10-CM | POA: Diagnosis not present

## 2017-08-05 DIAGNOSIS — I10 Essential (primary) hypertension: Secondary | ICD-10-CM | POA: Diagnosis not present

## 2017-09-20 DIAGNOSIS — Z299 Encounter for prophylactic measures, unspecified: Secondary | ICD-10-CM | POA: Diagnosis not present

## 2017-09-20 DIAGNOSIS — Z6833 Body mass index (BMI) 33.0-33.9, adult: Secondary | ICD-10-CM | POA: Diagnosis not present

## 2017-09-20 DIAGNOSIS — Z713 Dietary counseling and surveillance: Secondary | ICD-10-CM | POA: Diagnosis not present

## 2017-09-20 DIAGNOSIS — I10 Essential (primary) hypertension: Secondary | ICD-10-CM | POA: Diagnosis not present

## 2017-09-22 ENCOUNTER — Encounter (INDEPENDENT_AMBULATORY_CARE_PROVIDER_SITE_OTHER): Payer: Self-pay | Admitting: Family

## 2017-09-22 ENCOUNTER — Ambulatory Visit (INDEPENDENT_AMBULATORY_CARE_PROVIDER_SITE_OTHER): Payer: Medicare HMO | Admitting: Family

## 2017-09-22 ENCOUNTER — Ambulatory Visit (INDEPENDENT_AMBULATORY_CARE_PROVIDER_SITE_OTHER): Payer: Medicare HMO

## 2017-09-22 DIAGNOSIS — M1712 Unilateral primary osteoarthritis, left knee: Secondary | ICD-10-CM

## 2017-09-22 DIAGNOSIS — M25562 Pain in left knee: Secondary | ICD-10-CM

## 2017-09-22 MED ORDER — LIDOCAINE HCL 1 % IJ SOLN
5.0000 mL | INTRAMUSCULAR | Status: AC | PRN
  Administered 2017-09-22: 5 mL

## 2017-09-22 MED ORDER — IBUPROFEN-FAMOTIDINE 800-26.6 MG PO TABS: 1.0000 | ORAL_TABLET | Freq: Four times a day (QID) | ORAL | 3 refills | Status: DC | PRN

## 2017-09-22 MED ORDER — METHYLPREDNISOLONE ACETATE 40 MG/ML IJ SUSP
40.0000 mg | INTRAMUSCULAR | Status: AC | PRN
  Administered 2017-09-22: 40 mg via INTRA_ARTICULAR

## 2017-09-22 NOTE — Progress Notes (Signed)
Office Visit Note   Patient: Todd Gates           Date of Birth: 14-Oct-1945           MRN: [id number] Visit Date: 09/22/2017              Requested by: Glenda Chroman, MD [address] [place],  [address] PCP: Glenda Chroman, MD  Chief Complaint  Patient presents with  . Left Knee - Pain      HPI: The patient is a 72 year old gentleman who presents today complaining of a greater than one year history of left knee pain.  He walks a mile and a half daily and has aching knee pain primarily to the medial aspect.  No locking or catching.  No giving way.  Difficulty squatting and bending.  No recent injury.  Has tried ice as well as topical medications without relief.  Assessment & Plan: Visit Diagnoses:  1. Left knee pain, unspecified chronicity   2. Primary osteoarthritis of left knee     Plan: Depo-Medrol injection today without incident.  We will follow-up in the office as needed for further injections.  Discussed water aerobics which he will continue to do.  We will also try Duexis as has history of bleeding ulcer 20 years ago would not like to take Aleve or ibuprofen.  Follow-Up Instructions: Return if symptoms worsen or fail to improve.   Left Knee Exam   Muscle Strength  The patient has normal left knee strength.  Tenderness  The patient is experiencing tenderness in the medial joint line.  Range of Motion  The patient has normal left knee ROM.  Tests  Varus: negative Valgus: negative  Other  Erythema: absent Swelling: none      Patient is alert, oriented, no adenopathy, well-dressed, normal affect, normal respiratory effort.   Imaging: Xr Knee 1-2 Views Left  Result Date: 09/22/2017 Radiographs of the left knee show medial joint space narrowing with calcifications of the patellar tendon.  No images are attached to the encounter.  Labs: Lab Results  Component Value Date   REPTSTATUS 09/21/2007 FINAL 09/19/2007   CULT NO GROWTH 09/19/2007     No  results found for: ALBUMIN, PREALBUMIN, LABURIC  There is no height or weight on file to calculate BMI.  Orders:  Orders Placed This Encounter  Procedures  . XR Knee 1-2 Views Left   No orders of the defined types were placed in this encounter.    Procedures: Large Joint Inj: L knee on 09/22/2017 12:56 PM Indications: pain Details: 18 G 1.5 in needle, anteromedial approach Medications: 5 mL lidocaine 1 %; 40 mg methylPREDNISolone acetate 40 MG/ML Consent was given by the patient.      Clinical Data: No additional findings.  ROS:  All other systems negative, except as noted in the HPI. Review of Systems  Constitutional: Negative for chills and fever.  Musculoskeletal: Positive for arthralgias. Negative for joint swelling.    Objective: Vital Signs: There were no vitals taken for this visit.  Specialty Comments:  No specialty comments available.  PMFS History: Patient Active Problem List   Diagnosis Date Noted  . Achilles tendon contracture, left 02/21/2016  . Plantar fasciitis 01/23/2016  . Primary osteoarthritis of left knee 07/13/2013   Past Medical History:  Diagnosis Date  . Cancer [facility])    prostate  . Gout   . HTN (hypertension)   . Peptic ulcer disease     Family History  Problem  Relation Age of Onset  . Hypertension Unknown     Past Surgical History:  Procedure Laterality Date  . PROSTATE SURGERY     Social History   Occupational History  . Not on file  Tobacco Use  . Smoking status: Unknown If Ever Smoked  . Smokeless tobacco: Never Used  Substance and Sexual Activity  . Alcohol use: No  . Drug use: No  . Sexual activity: Not on file

## 2017-10-04 ENCOUNTER — Telehealth (INDEPENDENT_AMBULATORY_CARE_PROVIDER_SITE_OTHER): Payer: Self-pay | Admitting: Orthopedic Surgery

## 2017-10-04 ENCOUNTER — Other Ambulatory Visit (INDEPENDENT_AMBULATORY_CARE_PROVIDER_SITE_OTHER): Payer: Self-pay

## 2017-10-04 MED ORDER — IBUPROFEN-FAMOTIDINE 800-26.6 MG PO TABS: 1.0000 | ORAL_TABLET | Freq: Four times a day (QID) | ORAL | 3 refills | Status: DC | PRN

## 2017-10-04 NOTE — Telephone Encounter (Signed)
Noted  

## 2017-10-04 NOTE — Telephone Encounter (Signed)
Patient called and said to just cancel the prescription, did not go into any detail.

## 2017-10-04 NOTE — Telephone Encounter (Signed)
Called pt and advised that rx has been faxed to [facility] and that they will call him to verify address and mail rx to him and a reduced price then what he would pay at the local pharmacy. Will hold message and recheck on pt to make sure that he has received a call.

## 2017-10-04 NOTE — Telephone Encounter (Signed)
Patient called advised the Rx was sent to the wrong pharmacy. Patient said he uses the [facility] pharmacy in [place].  Pharmacy # [phone]  The number to contact patient is [phone]

## 2017-10-14 DIAGNOSIS — I10 Essential (primary) hypertension: Secondary | ICD-10-CM | POA: Diagnosis not present

## 2017-10-14 DIAGNOSIS — E1165 Type 2 diabetes mellitus with hyperglycemia: Secondary | ICD-10-CM | POA: Diagnosis not present

## 2017-10-14 DIAGNOSIS — Z299 Encounter for prophylactic measures, unspecified: Secondary | ICD-10-CM | POA: Diagnosis not present

## 2017-10-14 DIAGNOSIS — E7800 Pure hypercholesterolemia, unspecified: Secondary | ICD-10-CM | POA: Diagnosis not present

## 2017-10-14 DIAGNOSIS — Z6833 Body mass index (BMI) 33.0-33.9, adult: Secondary | ICD-10-CM | POA: Diagnosis not present

## 2017-11-04 DIAGNOSIS — Z1331 Encounter for screening for depression: Secondary | ICD-10-CM | POA: Diagnosis not present

## 2017-11-04 DIAGNOSIS — Z6833 Body mass index (BMI) 33.0-33.9, adult: Secondary | ICD-10-CM | POA: Diagnosis not present

## 2017-11-04 DIAGNOSIS — Z1339 Encounter for screening examination for other mental health and behavioral disorders: Secondary | ICD-10-CM | POA: Diagnosis not present

## 2017-11-04 DIAGNOSIS — Z299 Encounter for prophylactic measures, unspecified: Secondary | ICD-10-CM | POA: Diagnosis not present

## 2017-11-04 DIAGNOSIS — Z79899 Other long term (current) drug therapy: Secondary | ICD-10-CM | POA: Diagnosis not present

## 2017-11-04 DIAGNOSIS — Z7189 Other specified counseling: Secondary | ICD-10-CM | POA: Diagnosis not present

## 2017-11-04 DIAGNOSIS — R69 Illness, unspecified: Secondary | ICD-10-CM | POA: Diagnosis not present

## 2017-11-04 DIAGNOSIS — E7800 Pure hypercholesterolemia, unspecified: Secondary | ICD-10-CM | POA: Diagnosis not present

## 2017-11-04 DIAGNOSIS — I10 Essential (primary) hypertension: Secondary | ICD-10-CM | POA: Diagnosis not present

## 2017-11-04 DIAGNOSIS — Z1211 Encounter for screening for malignant neoplasm of colon: Secondary | ICD-10-CM | POA: Diagnosis not present

## 2017-11-04 DIAGNOSIS — Z0000 Encounter for general adult medical examination without abnormal findings: Secondary | ICD-10-CM | POA: Diagnosis not present

## 2017-11-04 DIAGNOSIS — R5383 Other fatigue: Secondary | ICD-10-CM | POA: Diagnosis not present

## 2017-11-04 DIAGNOSIS — Z125 Encounter for screening for malignant neoplasm of prostate: Secondary | ICD-10-CM | POA: Diagnosis not present

## 2017-11-19 DIAGNOSIS — E1165 Type 2 diabetes mellitus with hyperglycemia: Secondary | ICD-10-CM | POA: Diagnosis not present

## 2017-11-19 DIAGNOSIS — K219 Gastro-esophageal reflux disease without esophagitis: Secondary | ICD-10-CM | POA: Diagnosis not present

## 2017-11-19 DIAGNOSIS — I10 Essential (primary) hypertension: Secondary | ICD-10-CM | POA: Diagnosis not present

## 2017-11-19 DIAGNOSIS — Z299 Encounter for prophylactic measures, unspecified: Secondary | ICD-10-CM | POA: Diagnosis not present

## 2017-11-19 DIAGNOSIS — Z713 Dietary counseling and surveillance: Secondary | ICD-10-CM | POA: Diagnosis not present

## 2017-11-24 DIAGNOSIS — E7800 Pure hypercholesterolemia, unspecified: Secondary | ICD-10-CM | POA: Diagnosis not present

## 2017-11-24 DIAGNOSIS — G4730 Sleep apnea, unspecified: Secondary | ICD-10-CM | POA: Diagnosis not present

## 2017-11-24 DIAGNOSIS — I10 Essential (primary) hypertension: Secondary | ICD-10-CM | POA: Diagnosis not present

## 2017-11-24 DIAGNOSIS — Z299 Encounter for prophylactic measures, unspecified: Secondary | ICD-10-CM | POA: Diagnosis not present

## 2017-11-24 DIAGNOSIS — Z6833 Body mass index (BMI) 33.0-33.9, adult: Secondary | ICD-10-CM | POA: Diagnosis not present

## 2017-11-24 DIAGNOSIS — L0291 Cutaneous abscess, unspecified: Secondary | ICD-10-CM | POA: Diagnosis not present

## 2017-12-17 DIAGNOSIS — D649 Anemia, unspecified: Secondary | ICD-10-CM | POA: Diagnosis not present

## 2017-12-17 DIAGNOSIS — Z299 Encounter for prophylactic measures, unspecified: Secondary | ICD-10-CM | POA: Diagnosis not present

## 2017-12-17 DIAGNOSIS — E1165 Type 2 diabetes mellitus with hyperglycemia: Secondary | ICD-10-CM | POA: Diagnosis not present

## 2017-12-17 DIAGNOSIS — Z6834 Body mass index (BMI) 34.0-34.9, adult: Secondary | ICD-10-CM | POA: Diagnosis not present

## 2017-12-17 DIAGNOSIS — K219 Gastro-esophageal reflux disease without esophagitis: Secondary | ICD-10-CM | POA: Diagnosis not present

## 2017-12-17 DIAGNOSIS — Z79899 Other long term (current) drug therapy: Secondary | ICD-10-CM | POA: Diagnosis not present

## 2017-12-17 DIAGNOSIS — I10 Essential (primary) hypertension: Secondary | ICD-10-CM | POA: Diagnosis not present

## 2017-12-29 DIAGNOSIS — D649 Anemia, unspecified: Secondary | ICD-10-CM | POA: Diagnosis not present

## 2018-01-06 DIAGNOSIS — K641 Second degree hemorrhoids: Secondary | ICD-10-CM | POA: Diagnosis not present

## 2018-01-06 DIAGNOSIS — K219 Gastro-esophageal reflux disease without esophagitis: Secondary | ICD-10-CM | POA: Diagnosis not present

## 2018-01-06 DIAGNOSIS — Z79899 Other long term (current) drug therapy: Secondary | ICD-10-CM | POA: Diagnosis not present

## 2018-01-06 DIAGNOSIS — E119 Type 2 diabetes mellitus without complications: Secondary | ICD-10-CM | POA: Diagnosis not present

## 2018-01-06 DIAGNOSIS — D649 Anemia, unspecified: Secondary | ICD-10-CM | POA: Diagnosis not present

## 2018-01-06 DIAGNOSIS — G4730 Sleep apnea, unspecified: Secondary | ICD-10-CM | POA: Diagnosis not present

## 2018-01-06 DIAGNOSIS — I10 Essential (primary) hypertension: Secondary | ICD-10-CM | POA: Diagnosis not present

## 2018-01-06 DIAGNOSIS — K5730 Diverticulosis of large intestine without perforation or abscess without bleeding: Secondary | ICD-10-CM | POA: Diagnosis not present

## 2018-01-06 DIAGNOSIS — Z8546 Personal history of malignant neoplasm of prostate: Secondary | ICD-10-CM | POA: Diagnosis not present

## 2018-01-10 ENCOUNTER — Ambulatory Visit (INDEPENDENT_AMBULATORY_CARE_PROVIDER_SITE_OTHER): Payer: Medicare HMO | Admitting: Physician Assistant

## 2018-01-10 ENCOUNTER — Ambulatory Visit (INDEPENDENT_AMBULATORY_CARE_PROVIDER_SITE_OTHER): Payer: Medicare HMO

## 2018-01-10 ENCOUNTER — Encounter (INDEPENDENT_AMBULATORY_CARE_PROVIDER_SITE_OTHER): Payer: Self-pay | Admitting: Physician Assistant

## 2018-01-10 VITALS — Ht 68.0 in | Wt 214.0 lb

## 2018-01-10 DIAGNOSIS — M25562 Pain in left knee: Secondary | ICD-10-CM

## 2018-01-10 DIAGNOSIS — M1711 Unilateral primary osteoarthritis, right knee: Secondary | ICD-10-CM | POA: Diagnosis not present

## 2018-01-10 MED ORDER — LIDOCAINE HCL 1 % IJ SOLN
5.0000 mL | INTRAMUSCULAR | Status: AC | PRN
  Administered 2018-01-10: 5 mL

## 2018-01-10 MED ORDER — METHYLPREDNISOLONE ACETATE 40 MG/ML IJ SUSP
40.0000 mg | INTRAMUSCULAR | Status: AC | PRN
  Administered 2018-01-10: 40 mg via INTRA_ARTICULAR

## 2018-01-10 NOTE — Progress Notes (Signed)
Office Visit Note   Patient: Todd Gates           Date of Birth: April 14, 1945           MRN: [id number] Visit Date: 01/10/2018              Requested by: Glenda Chroman, MD [address] [place], [place] [address] PCP: Glenda Chroman, MD  Chief Complaint  Patient presents with  . Left Knee - Follow-up      HPI: The patient is a 72 yo male who is seen for right knee pain. He reports pain over the posterior knee and knee cap areas. He reports it hurts worse with activities such as bowling and going down stairs. He reports his left knee felt much better after his steroid injection several months ago. He is wearing a knee sleeve and this helps some.   Assessment & Plan: Visit Diagnoses:  1. Unilateral primary osteoarthritis, right knee   2. Left knee pain, unspecified chronicity     Plan: After informed consent, the patient underwent right steroid injection and he tolerated this well. We discussed that strengthening of the thigh muscles with straight leg raises and okay to do leg presses at the gym. Follow up prn for questions or concerns   Follow-Up Instructions: Return if symptoms worsen or fail to improve.   Ortho Exam  Patient is alert, oriented, no adenopathy, well-dressed, normal affect, normal respiratory effort. The right knee range of motion 0-110 with pain on end flexion. Slightly effused. No instability. No signs of infection or cellulitis.   Imaging: Xr Knee 3 View Right  Result Date: 01/10/2018 Pat[place] femoral arthritis which is moderate, no fractures or other abnormality.   No images are attached to the encounter.  Labs: Lab Results  Component Value Date   REPTSTATUS 09/21/2007 FINAL 09/19/2007   CULT NO GROWTH 09/19/2007     No results found for: ALBUMIN, PREALBUMIN, LABURIC  Body mass index is 32.54 kg/m.  Orders:  Orders Placed This Encounter  Procedures  . Large Joint Inj: R knee  . XR KNEE 3 VIEW RIGHT   No orders of the defined types were placed in  this encounter.    Procedures: Large Joint Inj: R knee on 01/10/2018 2:48 PM Indications: pain and diagnostic evaluation Details: 22 G 1.5 in needle, anteromedial approach  Arthrogram: No  Medications: 5 mL lidocaine 1 %; 40 mg methylPREDNISolone acetate 40 MG/ML Outcome: tolerated well, no immediate complications Procedure, treatment alternatives, risks and benefits explained, specific risks discussed. Consent was given by the patient. Immediately prior to procedure a time out was called to verify the correct patient, procedure, equipment, support staff and site/side marked as required. Patient was prepped and draped in the usual sterile fashion.      Clinical Data: No additional findings.  ROS:  All other systems negative, except as noted in the HPI. Review of Systems  Objective: Vital Signs: Ht 5\' 8"  (1.727 m)   Wt 214 lb (97.1 kg)   BMI 32.54 kg/m   Specialty Comments:  No specialty comments available.  PMFS History: Patient Active Problem List   Diagnosis Date Noted  . Achilles tendon contracture, left 02/21/2016  . Plantar fasciitis 01/23/2016  . Primary osteoarthritis of left knee 07/13/2013   Past Medical History:  Diagnosis Date  . Cancer [facility])    prostate  . Gout   . HTN (hypertension)   . Peptic ulcer disease     Family History  Problem Relation Age of Onset  . Hypertension Unknown     Past Surgical History:  Procedure Laterality Date  . PROSTATE SURGERY     Social History   Occupational History  . Not on file  Tobacco Use  . Smoking status: Unknown If Ever Smoked  . Smokeless tobacco: Never Used  Substance and Sexual Activity  . Alcohol use: No  . Drug use: No  . Sexual activity: Not on file

## 2018-01-12 ENCOUNTER — Ambulatory Visit (INDEPENDENT_AMBULATORY_CARE_PROVIDER_SITE_OTHER): Payer: Medicare HMO | Admitting: Family

## 2018-02-01 DIAGNOSIS — D649 Anemia, unspecified: Secondary | ICD-10-CM | POA: Diagnosis not present

## 2018-02-18 DIAGNOSIS — N183 Chronic kidney disease, stage 3 (moderate): Secondary | ICD-10-CM | POA: Diagnosis not present

## 2018-02-18 DIAGNOSIS — D649 Anemia, unspecified: Secondary | ICD-10-CM | POA: Diagnosis not present

## 2018-02-18 DIAGNOSIS — E1165 Type 2 diabetes mellitus with hyperglycemia: Secondary | ICD-10-CM | POA: Diagnosis not present

## 2018-02-18 DIAGNOSIS — E1122 Type 2 diabetes mellitus with diabetic chronic kidney disease: Secondary | ICD-10-CM | POA: Diagnosis not present

## 2018-02-18 DIAGNOSIS — Z299 Encounter for prophylactic measures, unspecified: Secondary | ICD-10-CM | POA: Diagnosis not present

## 2018-03-22 DIAGNOSIS — H409 Unspecified glaucoma: Secondary | ICD-10-CM | POA: Diagnosis not present

## 2018-03-22 DIAGNOSIS — M1990 Unspecified osteoarthritis, unspecified site: Secondary | ICD-10-CM | POA: Diagnosis not present

## 2018-03-22 DIAGNOSIS — G4730 Sleep apnea, unspecified: Secondary | ICD-10-CM | POA: Diagnosis not present

## 2018-03-22 DIAGNOSIS — N529 Male erectile dysfunction, unspecified: Secondary | ICD-10-CM | POA: Diagnosis not present

## 2018-03-22 DIAGNOSIS — C61 Malignant neoplasm of prostate: Secondary | ICD-10-CM | POA: Diagnosis not present

## 2018-03-22 DIAGNOSIS — M109 Gout, unspecified: Secondary | ICD-10-CM | POA: Diagnosis not present

## 2018-03-22 DIAGNOSIS — E669 Obesity, unspecified: Secondary | ICD-10-CM | POA: Diagnosis not present

## 2018-03-22 DIAGNOSIS — I10 Essential (primary) hypertension: Secondary | ICD-10-CM | POA: Diagnosis not present

## 2018-03-22 DIAGNOSIS — R32 Unspecified urinary incontinence: Secondary | ICD-10-CM | POA: Diagnosis not present

## 2018-03-22 DIAGNOSIS — E119 Type 2 diabetes mellitus without complications: Secondary | ICD-10-CM | POA: Diagnosis not present

## 2018-03-25 DIAGNOSIS — E7800 Pure hypercholesterolemia, unspecified: Secondary | ICD-10-CM | POA: Diagnosis not present

## 2018-03-25 DIAGNOSIS — I10 Essential (primary) hypertension: Secondary | ICD-10-CM | POA: Diagnosis not present

## 2018-03-25 DIAGNOSIS — Z299 Encounter for prophylactic measures, unspecified: Secondary | ICD-10-CM | POA: Diagnosis not present

## 2018-03-25 DIAGNOSIS — E1165 Type 2 diabetes mellitus with hyperglycemia: Secondary | ICD-10-CM | POA: Diagnosis not present

## 2018-03-25 DIAGNOSIS — Z789 Other specified health status: Secondary | ICD-10-CM | POA: Diagnosis not present

## 2018-03-25 DIAGNOSIS — J329 Chronic sinusitis, unspecified: Secondary | ICD-10-CM | POA: Diagnosis not present

## 2018-03-25 DIAGNOSIS — Z6833 Body mass index (BMI) 33.0-33.9, adult: Secondary | ICD-10-CM | POA: Diagnosis not present

## 2018-04-01 DIAGNOSIS — D649 Anemia, unspecified: Secondary | ICD-10-CM | POA: Diagnosis not present

## 2018-06-17 DIAGNOSIS — G4730 Sleep apnea, unspecified: Secondary | ICD-10-CM | POA: Diagnosis not present

## 2018-06-17 DIAGNOSIS — I10 Essential (primary) hypertension: Secondary | ICD-10-CM | POA: Diagnosis not present

## 2018-06-17 DIAGNOSIS — Z299 Encounter for prophylactic measures, unspecified: Secondary | ICD-10-CM | POA: Diagnosis not present

## 2018-06-17 DIAGNOSIS — E1165 Type 2 diabetes mellitus with hyperglycemia: Secondary | ICD-10-CM | POA: Diagnosis not present

## 2018-06-17 DIAGNOSIS — J309 Allergic rhinitis, unspecified: Secondary | ICD-10-CM | POA: Diagnosis not present

## 2018-08-02 DIAGNOSIS — R69 Illness, unspecified: Secondary | ICD-10-CM | POA: Diagnosis not present

## 2018-08-19 DIAGNOSIS — Z299 Encounter for prophylactic measures, unspecified: Secondary | ICD-10-CM | POA: Diagnosis not present

## 2018-08-19 DIAGNOSIS — C61 Malignant neoplasm of prostate: Secondary | ICD-10-CM | POA: Diagnosis not present

## 2018-08-19 DIAGNOSIS — I10 Essential (primary) hypertension: Secondary | ICD-10-CM | POA: Diagnosis not present

## 2018-08-19 DIAGNOSIS — R3989 Other symptoms and signs involving the genitourinary system: Secondary | ICD-10-CM | POA: Diagnosis not present

## 2018-08-19 DIAGNOSIS — R319 Hematuria, unspecified: Secondary | ICD-10-CM | POA: Diagnosis not present

## 2018-08-19 DIAGNOSIS — Z6832 Body mass index (BMI) 32.0-32.9, adult: Secondary | ICD-10-CM | POA: Diagnosis not present

## 2018-08-19 DIAGNOSIS — R300 Dysuria: Secondary | ICD-10-CM | POA: Diagnosis not present

## 2018-09-20 DIAGNOSIS — E7800 Pure hypercholesterolemia, unspecified: Secondary | ICD-10-CM | POA: Diagnosis not present

## 2018-09-20 DIAGNOSIS — Z299 Encounter for prophylactic measures, unspecified: Secondary | ICD-10-CM | POA: Diagnosis not present

## 2018-09-20 DIAGNOSIS — I10 Essential (primary) hypertension: Secondary | ICD-10-CM | POA: Diagnosis not present

## 2018-09-20 DIAGNOSIS — E1165 Type 2 diabetes mellitus with hyperglycemia: Secondary | ICD-10-CM | POA: Diagnosis not present

## 2018-09-20 DIAGNOSIS — C61 Malignant neoplasm of prostate: Secondary | ICD-10-CM | POA: Diagnosis not present

## 2018-09-20 DIAGNOSIS — Z6832 Body mass index (BMI) 32.0-32.9, adult: Secondary | ICD-10-CM | POA: Diagnosis not present

## 2018-10-27 DIAGNOSIS — R69 Illness, unspecified: Secondary | ICD-10-CM | POA: Diagnosis not present

## 2018-11-08 DIAGNOSIS — E1122 Type 2 diabetes mellitus with diabetic chronic kidney disease: Secondary | ICD-10-CM | POA: Diagnosis not present

## 2018-11-08 DIAGNOSIS — M7918 Myalgia, other site: Secondary | ICD-10-CM | POA: Diagnosis not present

## 2018-11-08 DIAGNOSIS — I10 Essential (primary) hypertension: Secondary | ICD-10-CM | POA: Diagnosis not present

## 2018-11-08 DIAGNOSIS — G4730 Sleep apnea, unspecified: Secondary | ICD-10-CM | POA: Diagnosis not present

## 2018-11-08 DIAGNOSIS — Z299 Encounter for prophylactic measures, unspecified: Secondary | ICD-10-CM | POA: Diagnosis not present

## 2018-11-08 DIAGNOSIS — Z6832 Body mass index (BMI) 32.0-32.9, adult: Secondary | ICD-10-CM | POA: Diagnosis not present

## 2018-11-16 DIAGNOSIS — Z1211 Encounter for screening for malignant neoplasm of colon: Secondary | ICD-10-CM | POA: Diagnosis not present

## 2018-11-16 DIAGNOSIS — Z6832 Body mass index (BMI) 32.0-32.9, adult: Secondary | ICD-10-CM | POA: Diagnosis not present

## 2018-11-16 DIAGNOSIS — Z0000 Encounter for general adult medical examination without abnormal findings: Secondary | ICD-10-CM | POA: Diagnosis not present

## 2018-11-16 DIAGNOSIS — Z1331 Encounter for screening for depression: Secondary | ICD-10-CM | POA: Diagnosis not present

## 2018-11-16 DIAGNOSIS — Z1339 Encounter for screening examination for other mental health and behavioral disorders: Secondary | ICD-10-CM | POA: Diagnosis not present

## 2018-11-16 DIAGNOSIS — E119 Type 2 diabetes mellitus without complications: Secondary | ICD-10-CM | POA: Diagnosis not present

## 2018-11-16 DIAGNOSIS — R5383 Other fatigue: Secondary | ICD-10-CM | POA: Diagnosis not present

## 2018-11-16 DIAGNOSIS — E1165 Type 2 diabetes mellitus with hyperglycemia: Secondary | ICD-10-CM | POA: Diagnosis not present

## 2018-11-16 DIAGNOSIS — Z7189 Other specified counseling: Secondary | ICD-10-CM | POA: Diagnosis not present

## 2018-11-16 DIAGNOSIS — I10 Essential (primary) hypertension: Secondary | ICD-10-CM | POA: Diagnosis not present

## 2018-11-16 DIAGNOSIS — Z299 Encounter for prophylactic measures, unspecified: Secondary | ICD-10-CM | POA: Diagnosis not present

## 2018-11-17 DIAGNOSIS — Z79899 Other long term (current) drug therapy: Secondary | ICD-10-CM | POA: Diagnosis not present

## 2018-11-17 DIAGNOSIS — R5383 Other fatigue: Secondary | ICD-10-CM | POA: Diagnosis not present

## 2018-11-17 DIAGNOSIS — C61 Malignant neoplasm of prostate: Secondary | ICD-10-CM | POA: Diagnosis not present

## 2018-11-17 DIAGNOSIS — E7800 Pure hypercholesterolemia, unspecified: Secondary | ICD-10-CM | POA: Diagnosis not present

## 2018-12-19 DIAGNOSIS — Z7689 Persons encountering health services in other specified circumstances: Secondary | ICD-10-CM | POA: Diagnosis not present

## 2018-12-19 DIAGNOSIS — N183 Chronic kidney disease, stage 3 unspecified: Secondary | ICD-10-CM | POA: Diagnosis not present

## 2018-12-19 DIAGNOSIS — R5383 Other fatigue: Secondary | ICD-10-CM | POA: Diagnosis not present

## 2019-01-03 DIAGNOSIS — I10 Essential (primary) hypertension: Secondary | ICD-10-CM | POA: Diagnosis not present

## 2019-01-03 DIAGNOSIS — Z299 Encounter for prophylactic measures, unspecified: Secondary | ICD-10-CM | POA: Diagnosis not present

## 2019-01-03 DIAGNOSIS — E1165 Type 2 diabetes mellitus with hyperglycemia: Secondary | ICD-10-CM | POA: Diagnosis not present

## 2019-01-03 DIAGNOSIS — G4730 Sleep apnea, unspecified: Secondary | ICD-10-CM | POA: Diagnosis not present

## 2019-01-03 DIAGNOSIS — Z6832 Body mass index (BMI) 32.0-32.9, adult: Secondary | ICD-10-CM | POA: Diagnosis not present

## 2019-01-24 ENCOUNTER — Other Ambulatory Visit: Payer: Self-pay | Admitting: *Deleted

## 2019-01-24 DIAGNOSIS — Z20822 Contact with and (suspected) exposure to covid-19: Secondary | ICD-10-CM

## 2019-01-26 LAB — NOVEL CORONAVIRUS, NAA: SARS-CoV-2, NAA: NOT DETECTED

## 2019-01-27 ENCOUNTER — Telehealth: Payer: Self-pay

## 2019-01-27 NOTE — Telephone Encounter (Signed)
Pt. Given COVID 19 results, verbalizes understanding. 

## 2019-01-31 DIAGNOSIS — Z299 Encounter for prophylactic measures, unspecified: Secondary | ICD-10-CM | POA: Diagnosis not present

## 2019-01-31 DIAGNOSIS — L600 Ingrowing nail: Secondary | ICD-10-CM | POA: Diagnosis not present

## 2019-01-31 DIAGNOSIS — Z713 Dietary counseling and surveillance: Secondary | ICD-10-CM | POA: Diagnosis not present

## 2019-01-31 DIAGNOSIS — I10 Essential (primary) hypertension: Secondary | ICD-10-CM | POA: Diagnosis not present

## 2019-01-31 DIAGNOSIS — Z6832 Body mass index (BMI) 32.0-32.9, adult: Secondary | ICD-10-CM | POA: Diagnosis not present

## 2019-02-02 DIAGNOSIS — H524 Presbyopia: Secondary | ICD-10-CM | POA: Diagnosis not present

## 2019-02-02 DIAGNOSIS — H35033 Hypertensive retinopathy, bilateral: Secondary | ICD-10-CM | POA: Diagnosis not present

## 2019-02-22 DIAGNOSIS — B351 Tinea unguium: Secondary | ICD-10-CM | POA: Diagnosis not present

## 2019-02-22 DIAGNOSIS — E119 Type 2 diabetes mellitus without complications: Secondary | ICD-10-CM | POA: Diagnosis not present

## 2019-03-31 DIAGNOSIS — Z789 Other specified health status: Secondary | ICD-10-CM | POA: Diagnosis not present

## 2019-03-31 DIAGNOSIS — M25551 Pain in right hip: Secondary | ICD-10-CM | POA: Diagnosis not present

## 2019-03-31 DIAGNOSIS — G4730 Sleep apnea, unspecified: Secondary | ICD-10-CM | POA: Diagnosis not present

## 2019-03-31 DIAGNOSIS — Z299 Encounter for prophylactic measures, unspecified: Secondary | ICD-10-CM | POA: Diagnosis not present

## 2019-03-31 DIAGNOSIS — Z6832 Body mass index (BMI) 32.0-32.9, adult: Secondary | ICD-10-CM | POA: Diagnosis not present

## 2019-03-31 DIAGNOSIS — E1122 Type 2 diabetes mellitus with diabetic chronic kidney disease: Secondary | ICD-10-CM | POA: Diagnosis not present

## 2019-03-31 DIAGNOSIS — I10 Essential (primary) hypertension: Secondary | ICD-10-CM | POA: Diagnosis not present

## 2019-04-12 DIAGNOSIS — Z6832 Body mass index (BMI) 32.0-32.9, adult: Secondary | ICD-10-CM | POA: Diagnosis not present

## 2019-04-12 DIAGNOSIS — Z299 Encounter for prophylactic measures, unspecified: Secondary | ICD-10-CM | POA: Diagnosis not present

## 2019-04-12 DIAGNOSIS — I10 Essential (primary) hypertension: Secondary | ICD-10-CM | POA: Diagnosis not present

## 2019-04-12 DIAGNOSIS — E1165 Type 2 diabetes mellitus with hyperglycemia: Secondary | ICD-10-CM | POA: Diagnosis not present

## 2019-04-12 DIAGNOSIS — N183 Chronic kidney disease, stage 3 unspecified: Secondary | ICD-10-CM | POA: Diagnosis not present

## 2019-04-12 DIAGNOSIS — G4730 Sleep apnea, unspecified: Secondary | ICD-10-CM | POA: Diagnosis not present

## 2019-04-25 DIAGNOSIS — E1140 Type 2 diabetes mellitus with diabetic neuropathy, unspecified: Secondary | ICD-10-CM | POA: Diagnosis not present

## 2019-04-25 DIAGNOSIS — E1159 Type 2 diabetes mellitus with other circulatory complications: Secondary | ICD-10-CM | POA: Diagnosis not present

## 2019-05-01 DIAGNOSIS — M2010 Hallux valgus (acquired), unspecified foot: Secondary | ICD-10-CM | POA: Diagnosis not present

## 2019-05-01 DIAGNOSIS — E119 Type 2 diabetes mellitus without complications: Secondary | ICD-10-CM | POA: Diagnosis not present

## 2019-05-01 DIAGNOSIS — B351 Tinea unguium: Secondary | ICD-10-CM | POA: Diagnosis not present

## 2019-05-02 DIAGNOSIS — R69 Illness, unspecified: Secondary | ICD-10-CM | POA: Diagnosis not present

## 2019-05-05 DIAGNOSIS — Z6833 Body mass index (BMI) 33.0-33.9, adult: Secondary | ICD-10-CM | POA: Diagnosis not present

## 2019-05-05 DIAGNOSIS — E1165 Type 2 diabetes mellitus with hyperglycemia: Secondary | ICD-10-CM | POA: Diagnosis not present

## 2019-05-05 DIAGNOSIS — Z299 Encounter for prophylactic measures, unspecified: Secondary | ICD-10-CM | POA: Diagnosis not present

## 2019-05-05 DIAGNOSIS — C61 Malignant neoplasm of prostate: Secondary | ICD-10-CM | POA: Diagnosis not present

## 2019-05-05 DIAGNOSIS — I10 Essential (primary) hypertension: Secondary | ICD-10-CM | POA: Diagnosis not present

## 2019-05-24 DIAGNOSIS — N393 Stress incontinence (female) (male): Secondary | ICD-10-CM | POA: Diagnosis not present

## 2019-05-24 DIAGNOSIS — C61 Malignant neoplasm of prostate: Secondary | ICD-10-CM | POA: Diagnosis not present

## 2019-06-13 DIAGNOSIS — N1831 Chronic kidney disease, stage 3a: Secondary | ICD-10-CM | POA: Diagnosis not present

## 2019-06-13 DIAGNOSIS — Z299 Encounter for prophylactic measures, unspecified: Secondary | ICD-10-CM | POA: Diagnosis not present

## 2019-06-13 DIAGNOSIS — R1110 Vomiting, unspecified: Secondary | ICD-10-CM | POA: Diagnosis not present

## 2019-06-13 DIAGNOSIS — R197 Diarrhea, unspecified: Secondary | ICD-10-CM | POA: Diagnosis not present

## 2019-06-13 DIAGNOSIS — E7800 Pure hypercholesterolemia, unspecified: Secondary | ICD-10-CM | POA: Diagnosis not present

## 2019-06-13 DIAGNOSIS — I10 Essential (primary) hypertension: Secondary | ICD-10-CM | POA: Diagnosis not present

## 2019-06-27 DIAGNOSIS — N393 Stress incontinence (female) (male): Secondary | ICD-10-CM | POA: Diagnosis not present

## 2019-06-27 DIAGNOSIS — M109 Gout, unspecified: Secondary | ICD-10-CM | POA: Diagnosis not present

## 2019-06-27 DIAGNOSIS — Z008 Encounter for other general examination: Secondary | ICD-10-CM | POA: Diagnosis not present

## 2019-06-27 DIAGNOSIS — I10 Essential (primary) hypertension: Secondary | ICD-10-CM | POA: Diagnosis not present

## 2019-06-27 DIAGNOSIS — J309 Allergic rhinitis, unspecified: Secondary | ICD-10-CM | POA: Diagnosis not present

## 2019-06-27 DIAGNOSIS — H409 Unspecified glaucoma: Secondary | ICD-10-CM | POA: Diagnosis not present

## 2019-06-27 DIAGNOSIS — E119 Type 2 diabetes mellitus without complications: Secondary | ICD-10-CM | POA: Diagnosis not present

## 2019-06-27 DIAGNOSIS — E785 Hyperlipidemia, unspecified: Secondary | ICD-10-CM | POA: Diagnosis not present

## 2019-06-27 DIAGNOSIS — C61 Malignant neoplasm of prostate: Secondary | ICD-10-CM | POA: Diagnosis not present

## 2019-06-27 DIAGNOSIS — E669 Obesity, unspecified: Secondary | ICD-10-CM | POA: Diagnosis not present

## 2019-06-27 DIAGNOSIS — G4730 Sleep apnea, unspecified: Secondary | ICD-10-CM | POA: Diagnosis not present

## 2019-07-10 DIAGNOSIS — E119 Type 2 diabetes mellitus without complications: Secondary | ICD-10-CM | POA: Diagnosis not present

## 2019-07-10 DIAGNOSIS — M2010 Hallux valgus (acquired), unspecified foot: Secondary | ICD-10-CM | POA: Diagnosis not present

## 2019-07-10 DIAGNOSIS — B351 Tinea unguium: Secondary | ICD-10-CM | POA: Diagnosis not present

## 2019-07-20 DIAGNOSIS — I10 Essential (primary) hypertension: Secondary | ICD-10-CM | POA: Diagnosis not present

## 2019-07-20 DIAGNOSIS — Z6832 Body mass index (BMI) 32.0-32.9, adult: Secondary | ICD-10-CM | POA: Diagnosis not present

## 2019-07-20 DIAGNOSIS — E1165 Type 2 diabetes mellitus with hyperglycemia: Secondary | ICD-10-CM | POA: Diagnosis not present

## 2019-07-20 DIAGNOSIS — Z299 Encounter for prophylactic measures, unspecified: Secondary | ICD-10-CM | POA: Diagnosis not present

## 2019-07-20 DIAGNOSIS — C61 Malignant neoplasm of prostate: Secondary | ICD-10-CM | POA: Diagnosis not present

## 2019-08-02 DIAGNOSIS — E1165 Type 2 diabetes mellitus with hyperglycemia: Secondary | ICD-10-CM | POA: Diagnosis not present

## 2019-08-02 DIAGNOSIS — M25559 Pain in unspecified hip: Secondary | ICD-10-CM | POA: Diagnosis not present

## 2019-08-02 DIAGNOSIS — Z299 Encounter for prophylactic measures, unspecified: Secondary | ICD-10-CM | POA: Diagnosis not present

## 2019-08-02 DIAGNOSIS — Z6832 Body mass index (BMI) 32.0-32.9, adult: Secondary | ICD-10-CM | POA: Diagnosis not present

## 2019-08-02 DIAGNOSIS — E119 Type 2 diabetes mellitus without complications: Secondary | ICD-10-CM | POA: Diagnosis not present

## 2019-08-02 DIAGNOSIS — C61 Malignant neoplasm of prostate: Secondary | ICD-10-CM | POA: Diagnosis not present

## 2019-08-02 DIAGNOSIS — I10 Essential (primary) hypertension: Secondary | ICD-10-CM | POA: Diagnosis not present

## 2019-08-22 DIAGNOSIS — E1165 Type 2 diabetes mellitus with hyperglycemia: Secondary | ICD-10-CM | POA: Diagnosis not present

## 2019-09-18 DIAGNOSIS — E119 Type 2 diabetes mellitus without complications: Secondary | ICD-10-CM | POA: Diagnosis not present

## 2019-09-18 DIAGNOSIS — M2010 Hallux valgus (acquired), unspecified foot: Secondary | ICD-10-CM | POA: Diagnosis not present

## 2019-09-18 DIAGNOSIS — B351 Tinea unguium: Secondary | ICD-10-CM | POA: Diagnosis not present

## 2019-09-21 ENCOUNTER — Ambulatory Visit (INDEPENDENT_AMBULATORY_CARE_PROVIDER_SITE_OTHER): Payer: Medicare HMO

## 2019-09-21 ENCOUNTER — Encounter: Payer: Self-pay | Admitting: Physician Assistant

## 2019-09-21 ENCOUNTER — Ambulatory Visit: Payer: Medicare HMO | Admitting: Physician Assistant

## 2019-09-21 VITALS — Ht 68.0 in | Wt 214.0 lb

## 2019-09-21 DIAGNOSIS — M25562 Pain in left knee: Secondary | ICD-10-CM | POA: Diagnosis not present

## 2019-09-21 MED ORDER — METHYLPREDNISOLONE ACETATE 40 MG/ML IJ SUSP
40.0000 mg | INTRAMUSCULAR | Status: AC | PRN
  Administered 2019-09-21: 17:00:00 40 mg via INTRA_ARTICULAR

## 2019-09-21 MED ORDER — LIDOCAINE HCL 1 % IJ SOLN
5.0000 mL | INTRAMUSCULAR | Status: AC | PRN
  Administered 2019-09-21: 17:00:00 5 mL

## 2019-09-21 NOTE — Progress Notes (Signed)
Office Visit Note   Patient: Todd Gates           Date of Birth: 03-04-1945           MRN: [id number] Visit Date: 09/21/2019              Requested by: Glenda Chroman, MD [address] [place],  [place] [address] PCP: Glenda Chroman, MD  Chief Complaint  Patient presents with  . Left Knee - Pain      HPI: This is a pleasant 74 year old gentleman with a history of right knee arthritis.  He had an injection in the past and did very well.  He is now having pain in his left knee.  He states this occurred after bowling.  He believes he has arthritis in this knee as well.  Assessment & Plan: Visit Diagnoses:  1. Left knee pain, unspecified chronicity     Plan: Left knee varus arthritis.  We talked about the natural history of this and treatments.  We talked about keeping his legs strong especially his quadriceps.  We talked about trying a topical anti-inflammatory such as Voltaren.  We will also go forward with an injection today.  Follow-Up Instructions: No follow-ups on file.   Ortho Exam  Patient is alert, oriented, no adenopathy, well-dressed, normal affect, normal respiratory effort. Left knee: No effusion some tenderness on the medial joint line he does have varus malalignment when standing.  No swelling no cellulitis  Imaging: No results found. No images are attached to the encounter.  Labs: Lab Results  Component Value Date   REPTSTATUS 09/21/2007 FINAL 09/19/2007   CULT NO GROWTH 09/19/2007     No results found for: ALBUMIN, PREALBUMIN, LABURIC  No results found for: MG No results found for: VD25OH  No results found for: PREALBUMIN CBC EXTENDED 09/18/2007 09/15/2007 09/12/2007  WBC 11.3(H) 13.5(H) 7.4  RBC 4.25 4.55 4.90  HGB 13.5 14.3 15.4  HCT 38.9(L) 42.1 45.0  PLT 244 211 236  NEUTROABS - 11.0(H) -  LYMPHSABS - 1.3 -     Body mass index is 32.54 kg/m.  Orders:  Orders Placed This Encounter  Procedures  . XR Knee 1-2 Views Left   No orders of the  defined types were placed in this encounter.    Procedures: Large Joint Inj: L knee on 09/21/2019 4:45 PM Indications: pain and diagnostic evaluation Details: 22 G 1.5 in needle, anterolateral approach  Arthrogram: No  Medications: 40 mg methylPREDNISolone acetate 40 MG/ML; 5 mL lidocaine 1 % Outcome: tolerated well, no immediate complications Procedure, treatment alternatives, risks and benefits explained, specific risks discussed. Consent was given by the patient.      Clinical Data: No additional findings.  ROS:  All other systems negative, except as noted in the HPI. Review of Systems  Objective: Vital Signs: Ht 5\' 8"  (1.727 m)   Wt (!) 214 lb (97.1 kg)   BMI 32.54 kg/m   Specialty Comments:  No specialty comments available.  PMFS History: Patient Active Problem List   Diagnosis Date Noted  . Achilles tendon contracture, left 02/21/2016  . Plantar fasciitis 01/23/2016  . Primary osteoarthritis of left knee 07/13/2013   Past Medical History:  Diagnosis Date  . Cancer [facility])    prostate  . Gout   . HTN (hypertension)   . Peptic ulcer disease     Family History  Problem Relation Age of Onset  . Hypertension Unknown     Past Surgical History:  Procedure Laterality Date  . PROSTATE SURGERY     Social History   Occupational History  . Not on file  Tobacco Use  . Smoking status: Unknown If Ever Smoked  . Smokeless tobacco: Never Used  Substance and Sexual Activity  . Alcohol use: No  . Drug use: No  . Sexual activity: Not on file

## 2019-09-22 DIAGNOSIS — E1165 Type 2 diabetes mellitus with hyperglycemia: Secondary | ICD-10-CM | POA: Diagnosis not present

## 2019-10-05 DIAGNOSIS — G4730 Sleep apnea, unspecified: Secondary | ICD-10-CM | POA: Diagnosis not present

## 2019-10-05 DIAGNOSIS — I10 Essential (primary) hypertension: Secondary | ICD-10-CM | POA: Diagnosis not present

## 2019-10-05 DIAGNOSIS — Z299 Encounter for prophylactic measures, unspecified: Secondary | ICD-10-CM | POA: Diagnosis not present

## 2019-10-05 DIAGNOSIS — M79602 Pain in left arm: Secondary | ICD-10-CM | POA: Diagnosis not present

## 2019-10-24 DIAGNOSIS — Z8739 Personal history of other diseases of the musculoskeletal system and connective tissue: Secondary | ICD-10-CM | POA: Diagnosis not present

## 2019-10-24 DIAGNOSIS — G4733 Obstructive sleep apnea (adult) (pediatric): Secondary | ICD-10-CM | POA: Diagnosis not present

## 2019-10-24 DIAGNOSIS — Z8546 Personal history of malignant neoplasm of prostate: Secondary | ICD-10-CM | POA: Diagnosis not present

## 2019-10-24 DIAGNOSIS — Z6831 Body mass index (BMI) 31.0-31.9, adult: Secondary | ICD-10-CM | POA: Diagnosis not present

## 2019-10-24 DIAGNOSIS — E1165 Type 2 diabetes mellitus with hyperglycemia: Secondary | ICD-10-CM | POA: Diagnosis not present

## 2019-10-24 DIAGNOSIS — I10 Essential (primary) hypertension: Secondary | ICD-10-CM | POA: Diagnosis not present

## 2019-10-24 DIAGNOSIS — H409 Unspecified glaucoma: Secondary | ICD-10-CM | POA: Diagnosis not present

## 2019-10-24 DIAGNOSIS — Z23 Encounter for immunization: Secondary | ICD-10-CM | POA: Diagnosis not present

## 2019-10-24 DIAGNOSIS — E1169 Type 2 diabetes mellitus with other specified complication: Secondary | ICD-10-CM | POA: Diagnosis not present

## 2019-11-08 DIAGNOSIS — R69 Illness, unspecified: Secondary | ICD-10-CM | POA: Diagnosis not present

## 2019-11-14 DIAGNOSIS — C61 Malignant neoplasm of prostate: Secondary | ICD-10-CM | POA: Diagnosis not present

## 2019-11-17 DIAGNOSIS — D649 Anemia, unspecified: Secondary | ICD-10-CM | POA: Diagnosis not present

## 2019-11-17 DIAGNOSIS — D529 Folate deficiency anemia, unspecified: Secondary | ICD-10-CM | POA: Diagnosis not present

## 2019-11-17 DIAGNOSIS — E1169 Type 2 diabetes mellitus with other specified complication: Secondary | ICD-10-CM | POA: Diagnosis not present

## 2019-11-17 DIAGNOSIS — D519 Vitamin B12 deficiency anemia, unspecified: Secondary | ICD-10-CM | POA: Diagnosis not present

## 2019-11-21 DIAGNOSIS — C61 Malignant neoplasm of prostate: Secondary | ICD-10-CM | POA: Diagnosis not present

## 2019-11-21 DIAGNOSIS — N393 Stress incontinence (female) (male): Secondary | ICD-10-CM | POA: Diagnosis not present

## 2019-11-21 DIAGNOSIS — I10 Essential (primary) hypertension: Secondary | ICD-10-CM | POA: Diagnosis not present

## 2019-11-21 DIAGNOSIS — G4733 Obstructive sleep apnea (adult) (pediatric): Secondary | ICD-10-CM | POA: Diagnosis not present

## 2019-11-21 DIAGNOSIS — Z23 Encounter for immunization: Secondary | ICD-10-CM | POA: Diagnosis not present

## 2019-11-21 DIAGNOSIS — Z1331 Encounter for screening for depression: Secondary | ICD-10-CM | POA: Diagnosis not present

## 2019-11-21 DIAGNOSIS — H409 Unspecified glaucoma: Secondary | ICD-10-CM | POA: Diagnosis not present

## 2019-11-21 DIAGNOSIS — E1169 Type 2 diabetes mellitus with other specified complication: Secondary | ICD-10-CM | POA: Diagnosis not present

## 2019-11-21 DIAGNOSIS — Z1389 Encounter for screening for other disorder: Secondary | ICD-10-CM | POA: Diagnosis not present

## 2019-11-21 DIAGNOSIS — Z8546 Personal history of malignant neoplasm of prostate: Secondary | ICD-10-CM | POA: Diagnosis not present

## 2019-11-23 DIAGNOSIS — E1165 Type 2 diabetes mellitus with hyperglycemia: Secondary | ICD-10-CM | POA: Diagnosis not present

## 2019-11-27 DIAGNOSIS — B351 Tinea unguium: Secondary | ICD-10-CM | POA: Diagnosis not present

## 2019-11-27 DIAGNOSIS — M2010 Hallux valgus (acquired), unspecified foot: Secondary | ICD-10-CM | POA: Diagnosis not present

## 2019-11-27 DIAGNOSIS — E119 Type 2 diabetes mellitus without complications: Secondary | ICD-10-CM | POA: Diagnosis not present

## 2019-12-22 DIAGNOSIS — Z8739 Personal history of other diseases of the musculoskeletal system and connective tissue: Secondary | ICD-10-CM | POA: Diagnosis not present

## 2019-12-22 DIAGNOSIS — Z8546 Personal history of malignant neoplasm of prostate: Secondary | ICD-10-CM | POA: Diagnosis not present

## 2019-12-22 DIAGNOSIS — H409 Unspecified glaucoma: Secondary | ICD-10-CM | POA: Diagnosis not present

## 2019-12-22 DIAGNOSIS — Z9989 Dependence on other enabling machines and devices: Secondary | ICD-10-CM | POA: Diagnosis not present

## 2019-12-22 DIAGNOSIS — I10 Essential (primary) hypertension: Secondary | ICD-10-CM | POA: Diagnosis not present

## 2019-12-22 DIAGNOSIS — G4733 Obstructive sleep apnea (adult) (pediatric): Secondary | ICD-10-CM | POA: Diagnosis not present

## 2019-12-22 DIAGNOSIS — Z6831 Body mass index (BMI) 31.0-31.9, adult: Secondary | ICD-10-CM | POA: Diagnosis not present

## 2019-12-22 DIAGNOSIS — E1169 Type 2 diabetes mellitus with other specified complication: Secondary | ICD-10-CM | POA: Diagnosis not present

## 2019-12-23 DIAGNOSIS — E1165 Type 2 diabetes mellitus with hyperglycemia: Secondary | ICD-10-CM | POA: Diagnosis not present

## 2020-01-10 DIAGNOSIS — C61 Malignant neoplasm of prostate: Secondary | ICD-10-CM | POA: Diagnosis not present

## 2020-01-23 DIAGNOSIS — E1165 Type 2 diabetes mellitus with hyperglycemia: Secondary | ICD-10-CM | POA: Diagnosis not present

## 2020-02-05 DIAGNOSIS — E119 Type 2 diabetes mellitus without complications: Secondary | ICD-10-CM | POA: Diagnosis not present

## 2020-02-05 DIAGNOSIS — M2010 Hallux valgus (acquired), unspecified foot: Secondary | ICD-10-CM | POA: Diagnosis not present

## 2020-02-05 DIAGNOSIS — B351 Tinea unguium: Secondary | ICD-10-CM | POA: Diagnosis not present

## 2020-02-13 DIAGNOSIS — E1165 Type 2 diabetes mellitus with hyperglycemia: Secondary | ICD-10-CM | POA: Diagnosis not present

## 2020-02-14 DIAGNOSIS — J40 Bronchitis, not specified as acute or chronic: Secondary | ICD-10-CM | POA: Diagnosis not present

## 2020-02-14 DIAGNOSIS — J329 Chronic sinusitis, unspecified: Secondary | ICD-10-CM | POA: Diagnosis not present

## 2020-02-14 DIAGNOSIS — Z20828 Contact with and (suspected) exposure to other viral communicable diseases: Secondary | ICD-10-CM | POA: Diagnosis not present

## 2020-02-20 DIAGNOSIS — I10 Essential (primary) hypertension: Secondary | ICD-10-CM | POA: Diagnosis not present

## 2020-02-20 DIAGNOSIS — Z6831 Body mass index (BMI) 31.0-31.9, adult: Secondary | ICD-10-CM | POA: Diagnosis not present

## 2020-02-20 DIAGNOSIS — E1169 Type 2 diabetes mellitus with other specified complication: Secondary | ICD-10-CM | POA: Diagnosis not present

## 2020-02-20 DIAGNOSIS — G4733 Obstructive sleep apnea (adult) (pediatric): Secondary | ICD-10-CM | POA: Diagnosis not present

## 2020-02-20 DIAGNOSIS — E785 Hyperlipidemia, unspecified: Secondary | ICD-10-CM | POA: Diagnosis not present

## 2020-02-20 DIAGNOSIS — Z9989 Dependence on other enabling machines and devices: Secondary | ICD-10-CM | POA: Diagnosis not present

## 2020-02-20 DIAGNOSIS — Z0001 Encounter for general adult medical examination with abnormal findings: Secondary | ICD-10-CM | POA: Diagnosis not present

## 2020-02-20 DIAGNOSIS — Z23 Encounter for immunization: Secondary | ICD-10-CM | POA: Diagnosis not present

## 2020-02-20 DIAGNOSIS — Z8546 Personal history of malignant neoplasm of prostate: Secondary | ICD-10-CM | POA: Diagnosis not present

## 2020-02-20 DIAGNOSIS — Z8739 Personal history of other diseases of the musculoskeletal system and connective tissue: Secondary | ICD-10-CM | POA: Diagnosis not present

## 2020-02-20 DIAGNOSIS — H409 Unspecified glaucoma: Secondary | ICD-10-CM | POA: Diagnosis not present

## 2020-02-24 HISTORY — PX: COLONOSCOPY W/ POLYPECTOMY: SHX1380

## 2020-03-08 ENCOUNTER — Encounter: Payer: Self-pay | Admitting: Physician Assistant

## 2020-03-08 ENCOUNTER — Ambulatory Visit: Payer: Medicare HMO | Admitting: Physician Assistant

## 2020-03-08 ENCOUNTER — Other Ambulatory Visit: Payer: Self-pay

## 2020-03-08 ENCOUNTER — Ambulatory Visit (INDEPENDENT_AMBULATORY_CARE_PROVIDER_SITE_OTHER): Payer: Medicare HMO

## 2020-03-08 DIAGNOSIS — M25562 Pain in left knee: Secondary | ICD-10-CM | POA: Diagnosis not present

## 2020-03-08 MED ORDER — METHYLPREDNISOLONE ACETATE 40 MG/ML IJ SUSP
40.0000 mg | INTRAMUSCULAR | Status: AC | PRN
  Administered 2020-03-08: 40 mg via INTRA_ARTICULAR

## 2020-03-08 MED ORDER — LIDOCAINE HCL 1 % IJ SOLN
5.0000 mL | INTRAMUSCULAR | Status: AC | PRN
  Administered 2020-03-08: 5 mL

## 2020-03-08 NOTE — Progress Notes (Signed)
Office Visit Note   Patient: Todd Gates           Date of Birth: [birth year]           MRN: [id number] Visit Date: 03/08/2020              Requested by: Glenda Chroman, MD [address] [place],  [place] [address] PCP: Glenda Chroman, MD  Chief Complaint  Patient presents with  . Left Hip - Pain  . Left Knee - Pain      HPI: Patient is a pleasant 75 year old gentleman with a history of left knee arthritis.  He last had an injection last summer and found it quite helpful he comes in today requesting another cortisone injection for similar symptoms.  He is also complaining of some left groin pain in the last couple days.  Denies any injuries  Assessment & Plan: Visit Diagnoses:  1. Left knee pain, unspecified chronicity     Plan: Findings with left hip impingement.  Also left knee arthritis.  We will go forward with an injection today.  Since the hip pain is fairly new we will try symptomatic treatment such as ice and heat for the next week.  Will contact us if he is no better.  He thinks maybe his gait has just been altered from his knee bothering him  Follow-Up Instructions: No follow-ups on file.   Ortho Exam  Patient is alert, oriented, no adenopathy, well-dressed, normal affect, normal respiratory effort. Left hip: No tenderness to palpation.  No tenderness over the lateral hip.  No increased pain with internal or external rotation.  No swelling.  Left knee no effusion no cellulitis no swelling he is tender over the lateral joint line  Imaging: XR HIP UNILAT W OR W/O PELVIS 2-3 VIEWS LEFT  Result Date: 03/08/2020 2 views of his hip demonstrate well reduced femoral head in the acetabulum no acute bony changes  No images are attached to the encounter.  Labs: Lab Results  Component Value Date   REPTSTATUS 09/21/2007 FINAL 09/19/2007   CULT NO GROWTH 09/19/2007     No results found for: ALBUMIN, PREALBUMIN, LABURIC  No results found for: MG No results found for:  VD25OH  No results found for: PREALBUMIN CBC EXTENDED 09/18/2007 09/15/2007 09/12/2007  WBC 11.3(H) 13.5(H) 7.4  RBC 4.25 4.55 4.90  HGB 13.5 14.3 15.4  HCT 38.9(L) 42.1 45.0  PLT 244 211 236  NEUTROABS - 11.0(H) -  LYMPHSABS - 1.3 -     There is no height or weight on file to calculate BMI.  Orders:  Orders Placed This Encounter  Procedures  . XR HIP UNILAT W OR W/O PELVIS 2-3 VIEWS LEFT   No orders of the defined types were placed in this encounter.    Procedures: Large Joint Inj on 03/08/2020 3:48 PM Indications: pain and diagnostic evaluation Details: 22 G 1.5 in needle, anterolateral approach  Arthrogram: No  Medications: 40 mg methylPREDNISolone acetate 40 MG/ML; 5 mL lidocaine 1 % Outcome: tolerated well, no immediate complications Procedure, treatment alternatives, risks and benefits explained, specific risks discussed. Consent was given by the patient.      Clinical Data: No additional findings.  ROS:  All other systems negative, except as noted in the HPI. Review of Systems  Objective: Vital Signs: There were no vitals taken for this visit.  Specialty Comments:  No specialty comments available.  PMFS History: Patient Active Problem List   Diagnosis Date Noted  .  Achilles tendon contracture, left 02/21/2016  . Plantar fasciitis 01/23/2016  . Primary osteoarthritis of left knee 07/13/2013   Past Medical History:  Diagnosis Date  . Cancer [facility])    prostate  . Gout   . HTN (hypertension)   . Peptic ulcer disease     Family History  Problem Relation Age of Onset  . Hypertension Unknown     Past Surgical History:  Procedure Laterality Date  . PROSTATE SURGERY     Social History   Occupational History  . Not on file  Tobacco Use  . Smoking status: Unknown If Ever Smoked  . Smokeless tobacco: Never Used  Substance and Sexual Activity  . Alcohol use: No  . Drug use: No  . Sexual activity: Not on file

## 2020-03-18 DIAGNOSIS — Z01818 Encounter for other preprocedural examination: Secondary | ICD-10-CM | POA: Diagnosis not present

## 2020-03-21 DIAGNOSIS — I10 Essential (primary) hypertension: Secondary | ICD-10-CM | POA: Diagnosis not present

## 2020-03-21 DIAGNOSIS — E119 Type 2 diabetes mellitus without complications: Secondary | ICD-10-CM | POA: Diagnosis not present

## 2020-03-21 DIAGNOSIS — D122 Benign neoplasm of ascending colon: Secondary | ICD-10-CM | POA: Diagnosis not present

## 2020-03-21 DIAGNOSIS — Z79899 Other long term (current) drug therapy: Secondary | ICD-10-CM | POA: Diagnosis not present

## 2020-03-21 DIAGNOSIS — Z7984 Long term (current) use of oral hypoglycemic drugs: Secondary | ICD-10-CM | POA: Diagnosis not present

## 2020-03-21 DIAGNOSIS — Z86010 Personal history of colonic polyps: Secondary | ICD-10-CM | POA: Diagnosis not present

## 2020-03-21 DIAGNOSIS — K5730 Diverticulosis of large intestine without perforation or abscess without bleeding: Secondary | ICD-10-CM | POA: Diagnosis not present

## 2020-03-21 DIAGNOSIS — K635 Polyp of colon: Secondary | ICD-10-CM | POA: Diagnosis not present

## 2020-03-21 DIAGNOSIS — Z8546 Personal history of malignant neoplasm of prostate: Secondary | ICD-10-CM | POA: Diagnosis not present

## 2020-03-21 DIAGNOSIS — Z1211 Encounter for screening for malignant neoplasm of colon: Secondary | ICD-10-CM | POA: Diagnosis not present

## 2020-03-21 DIAGNOSIS — Z791 Long term (current) use of non-steroidal anti-inflammatories (NSAID): Secondary | ICD-10-CM | POA: Diagnosis not present

## 2020-03-21 DIAGNOSIS — D126 Benign neoplasm of colon, unspecified: Secondary | ICD-10-CM | POA: Diagnosis not present

## 2020-03-23 DIAGNOSIS — E1169 Type 2 diabetes mellitus with other specified complication: Secondary | ICD-10-CM | POA: Diagnosis not present

## 2020-03-23 DIAGNOSIS — I10 Essential (primary) hypertension: Secondary | ICD-10-CM | POA: Diagnosis not present

## 2020-03-23 DIAGNOSIS — E785 Hyperlipidemia, unspecified: Secondary | ICD-10-CM | POA: Diagnosis not present

## 2020-04-10 DIAGNOSIS — K5730 Diverticulosis of large intestine without perforation or abscess without bleeding: Secondary | ICD-10-CM | POA: Diagnosis not present

## 2020-04-10 DIAGNOSIS — D122 Benign neoplasm of ascending colon: Secondary | ICD-10-CM | POA: Diagnosis not present

## 2020-04-13 DIAGNOSIS — E669 Obesity, unspecified: Secondary | ICD-10-CM | POA: Diagnosis not present

## 2020-04-13 DIAGNOSIS — G4733 Obstructive sleep apnea (adult) (pediatric): Secondary | ICD-10-CM | POA: Diagnosis not present

## 2020-04-13 DIAGNOSIS — E785 Hyperlipidemia, unspecified: Secondary | ICD-10-CM | POA: Diagnosis not present

## 2020-04-13 DIAGNOSIS — I10 Essential (primary) hypertension: Secondary | ICD-10-CM | POA: Diagnosis not present

## 2020-04-13 DIAGNOSIS — H409 Unspecified glaucoma: Secondary | ICD-10-CM | POA: Diagnosis not present

## 2020-04-13 DIAGNOSIS — E1165 Type 2 diabetes mellitus with hyperglycemia: Secondary | ICD-10-CM | POA: Diagnosis not present

## 2020-04-13 DIAGNOSIS — E1140 Type 2 diabetes mellitus with diabetic neuropathy, unspecified: Secondary | ICD-10-CM | POA: Diagnosis not present

## 2020-04-13 DIAGNOSIS — C61 Malignant neoplasm of prostate: Secondary | ICD-10-CM | POA: Diagnosis not present

## 2020-04-13 DIAGNOSIS — J309 Allergic rhinitis, unspecified: Secondary | ICD-10-CM | POA: Diagnosis not present

## 2020-04-13 DIAGNOSIS — G8929 Other chronic pain: Secondary | ICD-10-CM | POA: Diagnosis not present

## 2020-04-15 DIAGNOSIS — M2010 Hallux valgus (acquired), unspecified foot: Secondary | ICD-10-CM | POA: Diagnosis not present

## 2020-04-15 DIAGNOSIS — B351 Tinea unguium: Secondary | ICD-10-CM | POA: Diagnosis not present

## 2020-04-22 DIAGNOSIS — E1169 Type 2 diabetes mellitus with other specified complication: Secondary | ICD-10-CM | POA: Diagnosis not present

## 2020-04-22 DIAGNOSIS — I10 Essential (primary) hypertension: Secondary | ICD-10-CM | POA: Diagnosis not present

## 2020-04-22 DIAGNOSIS — E785 Hyperlipidemia, unspecified: Secondary | ICD-10-CM | POA: Diagnosis not present

## 2020-05-22 DIAGNOSIS — E785 Hyperlipidemia, unspecified: Secondary | ICD-10-CM | POA: Diagnosis not present

## 2020-05-22 DIAGNOSIS — E1169 Type 2 diabetes mellitus with other specified complication: Secondary | ICD-10-CM | POA: Diagnosis not present

## 2020-05-22 DIAGNOSIS — I10 Essential (primary) hypertension: Secondary | ICD-10-CM | POA: Diagnosis not present

## 2020-06-19 DIAGNOSIS — Z8546 Personal history of malignant neoplasm of prostate: Secondary | ICD-10-CM | POA: Diagnosis not present

## 2020-06-19 DIAGNOSIS — E785 Hyperlipidemia, unspecified: Secondary | ICD-10-CM | POA: Diagnosis not present

## 2020-06-19 DIAGNOSIS — G4733 Obstructive sleep apnea (adult) (pediatric): Secondary | ICD-10-CM | POA: Diagnosis not present

## 2020-06-19 DIAGNOSIS — E1169 Type 2 diabetes mellitus with other specified complication: Secondary | ICD-10-CM | POA: Diagnosis not present

## 2020-06-19 DIAGNOSIS — Z8739 Personal history of other diseases of the musculoskeletal system and connective tissue: Secondary | ICD-10-CM | POA: Diagnosis not present

## 2020-06-19 DIAGNOSIS — H409 Unspecified glaucoma: Secondary | ICD-10-CM | POA: Diagnosis not present

## 2020-06-19 DIAGNOSIS — I10 Essential (primary) hypertension: Secondary | ICD-10-CM | POA: Diagnosis not present

## 2020-06-19 DIAGNOSIS — Z9989 Dependence on other enabling machines and devices: Secondary | ICD-10-CM | POA: Diagnosis not present

## 2020-06-22 DIAGNOSIS — I10 Essential (primary) hypertension: Secondary | ICD-10-CM | POA: Diagnosis not present

## 2020-06-22 DIAGNOSIS — E785 Hyperlipidemia, unspecified: Secondary | ICD-10-CM | POA: Diagnosis not present

## 2020-06-22 DIAGNOSIS — E1169 Type 2 diabetes mellitus with other specified complication: Secondary | ICD-10-CM | POA: Diagnosis not present

## 2020-06-24 DIAGNOSIS — B351 Tinea unguium: Secondary | ICD-10-CM | POA: Diagnosis not present

## 2020-06-24 DIAGNOSIS — M2010 Hallux valgus (acquired), unspecified foot: Secondary | ICD-10-CM | POA: Diagnosis not present

## 2020-07-22 DIAGNOSIS — E1169 Type 2 diabetes mellitus with other specified complication: Secondary | ICD-10-CM | POA: Diagnosis not present

## 2020-07-22 DIAGNOSIS — E785 Hyperlipidemia, unspecified: Secondary | ICD-10-CM | POA: Diagnosis not present

## 2020-07-22 DIAGNOSIS — I10 Essential (primary) hypertension: Secondary | ICD-10-CM | POA: Diagnosis not present

## 2020-08-05 ENCOUNTER — Ambulatory Visit (INDEPENDENT_AMBULATORY_CARE_PROVIDER_SITE_OTHER): Payer: No Typology Code available for payment source | Admitting: Orthopaedic Surgery

## 2020-08-05 ENCOUNTER — Other Ambulatory Visit: Payer: Self-pay

## 2020-08-05 VITALS — Ht 68.0 in | Wt 211.0 lb

## 2020-08-05 DIAGNOSIS — M25552 Pain in left hip: Secondary | ICD-10-CM | POA: Diagnosis not present

## 2020-08-05 MED ORDER — METHYLPREDNISOLONE ACETATE 40 MG/ML IJ SUSP
40.0000 mg | INTRAMUSCULAR | Status: AC | PRN
  Administered 2020-08-05: 14:00:00 40 mg via INTRA_ARTICULAR

## 2020-08-05 MED ORDER — LIDOCAINE HCL 1 % IJ SOLN
3.0000 mL | INTRAMUSCULAR | Status: AC | PRN
  Administered 2020-08-05: 14:00:00 3 mL

## 2020-08-05 NOTE — Progress Notes (Signed)
Office Visit Note   Patient: Todd Gates           Date of Birth: Jun 26, 1945           MRN: [id number] Visit Date: 08/05/2020              Requested by: Glenda Chroman, MD [place],  [place] [address] PCP: Glenda Chroman, MD   Assessment & Plan: Visit Diagnoses:  1. Pain in left hip     Plan: I did place a steroid injection over the left hip trochanteric area.  At this point a MRI of the left hip is warranted to truly assess the cartilage to help [place] determine whether or not he would benefit from a joint replacement given the failure of all forms of conservative treatment.  He agrees with this.  I did watch him ambulate and he does exhibit pain when putting weight on the left hip.  He is ambulate with a cane as well.  Follow-Up Instructions: Return in about 2 weeks (around 08/19/2020).   Orders:  Orders Placed This Encounter  Procedures   Large Joint Inj   No orders of the defined types were placed in this encounter.     Procedures: Large Joint Inj: L greater trochanter on 08/05/2020 1:46 PM Indications: pain and diagnostic evaluation Details: 22 G 1.5 in needle, lateral approach  Arthrogram: No  Medications: 3 mL lidocaine 1 %; 40 mg methylPREDNISolone acetate 40 MG/ML Outcome: tolerated well, no immediate complications Procedure, treatment alternatives, risks and benefits explained, specific risks discussed. Consent was given by the patient. Immediately prior to procedure a time out was called to verify the correct patient, procedure, equipment, support staff and site/side marked as required. Patient was prepped and draped in the usual sterile fashion.      Clinical Data: No additional findings.   Subjective: Chief Complaint  Patient presents with   Left Hip - Pain  The patient comes as a referral from the [facility] to evaluate and treat right hip pain.  He had x-rays action the canopy system here for [place] to review of his pelvis and hips.  He ambulates  with a cane.  He has had prostate cancer surgery.  He is 75 years old.  He is left hip hurts with weightbearing.  He is mainly on the lateral aspect of his hip but some in the groin.  He had a steroid injection he states under imaging in the groin area on his left hip back in March.  He said that did not help.  After probing him he said that may have really helped for a few days.  HPI  Review of Systems He currently denies any headache, chest pain, shortness of breath, fever, chills, nausea, vomiting  Objective: Vital Signs: Ht 5\' 8"  (1.727 m)   Wt 211 lb (95.7 kg)   BMI 32.08 kg/m   Physical Exam He is alert and oriented x3 and in no acute distress Ortho Exam Examination of both hips show they do move smoothly but there is definitely pain and guarding with the left hip and none on the right hip.  There is also pain of the trochanteric area but also in the groin on the left side. Specialty Comments:  No specialty comments available.  Imaging: No results found. X-rays of the pelvis on the canopy system reviewed from January show some arthritic changes in both hips with para-articular osteophyte off of the acetabulum on both sides.  There is slight joint space narrowing bilaterally.  PMFS History: Patient Active Problem List   Diagnosis Date Noted   Achilles tendon contracture, left 02/21/2016   Plantar fasciitis 01/23/2016   Primary osteoarthritis of left knee 07/13/2013   Past Medical History:  Diagnosis Date   Cancer ([facility])    prostate   Gout    HTN (hypertension)    Peptic ulcer disease     Family History  Problem Relation Age of Onset   Hypertension Unknown     Past Surgical History:  Procedure Laterality Date   PROSTATE SURGERY     Social History   Occupational History   Not on file  Tobacco Use   Smoking status: Unknown   Smokeless tobacco: Never  Substance and Sexual Activity   Alcohol use: No   Drug use: No   Sexual activity: Not on file

## 2020-08-06 ENCOUNTER — Other Ambulatory Visit: Payer: Self-pay

## 2020-08-06 DIAGNOSIS — M25552 Pain in left hip: Secondary | ICD-10-CM

## 2020-08-12 DIAGNOSIS — Z23 Encounter for immunization: Secondary | ICD-10-CM | POA: Diagnosis not present

## 2020-08-14 ENCOUNTER — Telehealth: Payer: Self-pay | Admitting: Orthopaedic Surgery

## 2020-08-14 NOTE — Telephone Encounter (Signed)
08/05/20 ov note faxed to [facility] [phone], ph [phone]

## 2020-08-18 ENCOUNTER — Ambulatory Visit
Admission: RE | Admit: 2020-08-18 | Discharge: 2020-08-18 | Disposition: A | Discharge status: Home / Self Care | Payer: No Typology Code available for payment source | Source: Ambulatory Visit | Attending: Orthopaedic Surgery | Admitting: Orthopaedic Surgery

## 2020-08-18 ENCOUNTER — Other Ambulatory Visit: Payer: Self-pay

## 2020-08-18 DIAGNOSIS — M25552 Pain in left hip: Secondary | ICD-10-CM

## 2020-08-21 ENCOUNTER — Ambulatory Visit (INDEPENDENT_AMBULATORY_CARE_PROVIDER_SITE_OTHER): Payer: No Typology Code available for payment source | Admitting: Orthopaedic Surgery

## 2020-08-21 ENCOUNTER — Encounter: Payer: Self-pay | Admitting: Orthopaedic Surgery

## 2020-08-21 DIAGNOSIS — M1612 Unilateral primary osteoarthritis, left hip: Secondary | ICD-10-CM

## 2020-08-21 NOTE — Progress Notes (Signed)
HPI: Todd Gates returns today to go over the MRI of his left hip.  He continues to have severe pain in his left hip and groin despite conservative measures which have included the use of a cane trochanteric injection and intra-articular injection of the left hip. MRI images left hip were reviewed with the patient today.  MRI shows severe arthritis of the left hip.  There were cystic changes within the acetabulum and superior lateral femoral head neck junction.  This all felt to be arthritic. Patient reports his diabetes is under good control although his last hemoglobin A1c was 6.8.  Review of systems: Denies any fevers, chills, ongoing infections.  Physical exam: Height is 5 foot 8.5 inches, weight 211 BMI 31.6 General well-developed well-nourished male no acute distress.  Ambulates with antalgic gait with the use of a cane. Psych: Alert and oriented x3.  Impression: Severe osteoarthritis left hip.  Plan discussed with patient the findings of the MRI and after going over the images with him he wishes to proceed with left total hip arthroplasty.  Questions were encouraged and answered at length.  Risk benefits reviewed with the patient at length.  Postop protocol reviewed with the patient at length.  He understands the risk include but are not limited to leg length discrepancy, blood loss, nerve vessel injury, DVT/PE, wound healing problems and infection.  We will have him follow-up with [place] 2 weeks postop.

## 2020-09-02 DIAGNOSIS — B351 Tinea unguium: Secondary | ICD-10-CM | POA: Diagnosis not present

## 2020-09-02 DIAGNOSIS — M2010 Hallux valgus (acquired), unspecified foot: Secondary | ICD-10-CM | POA: Diagnosis not present

## 2020-09-10 ENCOUNTER — Other Ambulatory Visit: Payer: Self-pay

## 2020-09-22 DIAGNOSIS — E785 Hyperlipidemia, unspecified: Secondary | ICD-10-CM | POA: Diagnosis not present

## 2020-09-22 DIAGNOSIS — E1169 Type 2 diabetes mellitus with other specified complication: Secondary | ICD-10-CM | POA: Diagnosis not present

## 2020-09-22 DIAGNOSIS — I10 Essential (primary) hypertension: Secondary | ICD-10-CM | POA: Diagnosis not present

## 2020-10-07 ENCOUNTER — Other Ambulatory Visit: Payer: Self-pay | Admitting: Physician Assistant

## 2020-10-07 NOTE — Pre-Procedure Instructions (Addendum)
Surgical Instructions    Your procedure is scheduled on Tuesday, August 23rd.  Report to [facility] Main Entrance "A" at 10:00 A.M., then check in with the Admitting office.  Call this number if you have problems the morning of surgery:  [phone]   If you have any questions prior to your surgery date call [phone]: Open Monday-Friday 8am-4pm    Remember:  Do not eat after midnight the night before your surgery  You may drink clear liquids until 9:00 a.m. the morning of your surgery.   Clear liquids allowed are: Water, Non-Citrus Juices (without pulp), Carbonated Beverages, Clear Tea, Black Coffee Only, and Gatorade.    Take these medicines the morning of surgery with A SIP OF WATER  ALLOPURINOL atorvastatin (LIPITOR)  baclofen (LIORESAL) Colchicine BRIMONIDINE TARTRATE OP febuxostat (ULORIC) sulfamethoxazole-trimethoprim (BACTRIM DS,SEPTRA DS)   As of today, STOP taking any Aspirin (unless otherwise instructed by your surgeon) Aleve, Naproxen, Ibuprofen, Motrin, Advil, Goody's, BC's, all herbal medications, fish oil, and all vitamins. This includes: diclofenac (VOLTAREN).                      Do NOT Smoke (Tobacco/Vaping) or drink Alcohol 24 hours prior to your procedure.  If you use a CPAP at night, you may bring all equipment for your overnight stay.   Contacts, glasses, piercing's, hearing aid's, dentures or partials may not be worn into surgery, please bring cases for these belongings.    For patients admitted to the hospital, discharge time will be determined by your treatment team.   Patients discharged the day of surgery will not be allowed to drive home, and someone needs to stay with them for 24 hours.  ONLY 1 SUPPORT PERSON MAY BE PRESENT WHILE YOU ARE IN SURGERY. IF YOU ARE TO BE ADMITTED ONCE YOU ARE IN YOUR ROOM YOU WILL BE ALLOWED TWO (2) VISITORS.  Minor children may have two parents present. Special consideration for safety and communication needs will  be reviewed on a case by case basis.   Special instructions:   [place]- Preparing For Surgery  Before surgery, you can play an important role. Because skin is not sterile, your skin needs to be as free of germs as possible. You can reduce the number of germs on your skin by washing with CHG (chlorahexidine gluconate) Soap before surgery.  CHG is an antiseptic cleaner which kills germs and bonds with the skin to continue killing germs even after washing.    Oral Hygiene is also important to reduce your risk of infection.  Remember - BRUSH YOUR TEETH THE MORNING OF SURGERY WITH YOUR REGULAR TOOTHPASTE  Please do not use if you have an allergy to CHG or antibacterial soaps. If your skin becomes reddened/irritated stop using the CHG.  Do not shave (including legs and underarms) for at least 48 hours prior to first CHG shower. It is OK to shave your face.  Please follow these instructions carefully.   Shower the NIGHT BEFORE SURGERY and the MORNING OF SURGERY  If you chose to wash your hair, wash your hair first as usual with your normal shampoo.  After you shampoo, rinse your hair and body thoroughly to remove the shampoo.  Use CHG Soap as you would any other liquid soap. You can apply CHG directly to the skin and wash gently with a scrungie or a clean washcloth.   Apply the CHG Soap to your body ONLY FROM THE NECK DOWN.  Do not use  on open wounds or open sores. Avoid contact with your eyes, ears, mouth and genitals (private parts). Wash Face and genitals (private parts)  with your normal soap.   Wash thoroughly, paying special attention to the area where your surgery will be performed.  Thoroughly rinse your body with warm water from the neck down.  DO NOT shower/wash with your normal soap after using and rinsing off the CHG Soap.  Pat yourself dry with a CLEAN TOWEL.  Wear CLEAN PAJAMAS to bed the night before surgery  Place CLEAN SHEETS on your bed the night before your  surgery  DO NOT SLEEP WITH PETS.   Day of Surgery: Shower with CHG soap. Do not wear jewelry. Do not wear lotions, powders, colognes, or deodorant. Men may shave face and neck. Do not bring valuables to the hospital. [facility] is not responsible for any belongings or valuables. Wear Clean/Comfortable clothing the morning of surgery Remember to brush your teeth WITH YOUR REGULAR TOOTHPASTE.   Please read over the following fact sheets that you were given.

## 2020-10-08 ENCOUNTER — Encounter (HOSPITAL_COMMUNITY): Payer: Self-pay

## 2020-10-08 ENCOUNTER — Other Ambulatory Visit: Payer: Self-pay

## 2020-10-08 ENCOUNTER — Telehealth: Payer: Self-pay | Admitting: Orthopaedic Surgery

## 2020-10-08 ENCOUNTER — Encounter (HOSPITAL_COMMUNITY)
Admission: RE | Admit: 2020-10-08 | Discharge: 2020-10-08 | Disposition: A | Discharge status: Home / Self Care | Payer: No Typology Code available for payment source | Source: Ambulatory Visit | Attending: Orthopaedic Surgery | Admitting: Orthopaedic Surgery

## 2020-10-08 DIAGNOSIS — Z01818 Encounter for other preprocedural examination: Secondary | ICD-10-CM | POA: Insufficient documentation

## 2020-10-08 DIAGNOSIS — I10 Essential (primary) hypertension: Secondary | ICD-10-CM | POA: Diagnosis not present

## 2020-10-08 DIAGNOSIS — E119 Type 2 diabetes mellitus without complications: Secondary | ICD-10-CM | POA: Diagnosis not present

## 2020-10-08 HISTORY — DX: Fatty (change of) liver, not elsewhere classified: K76.0

## 2020-10-08 HISTORY — DX: Sleep apnea, unspecified: G47.30

## 2020-10-08 HISTORY — DX: Unspecified glaucoma: H40.9

## 2020-10-08 HISTORY — DX: Type 2 diabetes mellitus without complications: E11.9

## 2020-10-08 HISTORY — DX: Unspecified osteoarthritis, unspecified site: M19.90

## 2020-10-08 LAB — BASIC METABOLIC PANEL
Anion gap: 9 (ref 5–15)
BUN: 24 mg/dL — ABNORMAL HIGH (ref 8–23)
CO2: 26 mmol/L (ref 22–32)
Calcium: 10 mg/dL (ref 8.9–10.3)
Chloride: 105 mmol/L (ref 98–111)
Creatinine, Ser: 1.57 mg/dL — ABNORMAL HIGH (ref 0.61–1.24)
GFR, Estimated: 46 mL/min — ABNORMAL LOW (ref >60)
Glucose, Bld: 118 mg/dL — ABNORMAL HIGH (ref 70–99)
Potassium: 3.8 mmol/L (ref 3.5–5.1)
Sodium: 140 mmol/L (ref 135–145)

## 2020-10-08 LAB — CBC
HCT: 34.1 % — ABNORMAL LOW (ref 39.0–52.0)
Hemoglobin: 10.9 g/dL — ABNORMAL LOW (ref 13.0–17.0)
MCH: 30.1 pg (ref 26.0–34.0)
MCHC: 32 g/dL (ref 30.0–36.0)
MCV: 94.2 fL (ref 80.0–100.0)
Platelets: 287 10*3/uL (ref 150–400)
RBC: 3.62 MIL/uL — ABNORMAL LOW (ref 4.22–5.81)
RDW: 14 % (ref 11.5–15.5)
WBC: 9 10*3/uL (ref 4.0–10.5)
nRBC: 0 % (ref 0.0–0.2)

## 2020-10-08 LAB — HEMOGLOBIN A1C
Hgb A1c MFr Bld: 7.5 % — ABNORMAL HIGH (ref 4.8–5.6)
Mean Plasma Glucose: 168.55 mg/dL

## 2020-10-08 LAB — SURGICAL PCR SCREEN
MRSA, PCR: NEGATIVE
Staphylococcus aureus: NEGATIVE

## 2020-10-08 LAB — GLUCOSE, CAPILLARY: Glucose-Capillary: 173 mg/dL — ABNORMAL HIGH (ref 70–99)

## 2020-10-08 LAB — TYPE AND SCREEN
ABO/RH(D): O POS
Antibody Screen: NEGATIVE

## 2020-10-08 NOTE — Progress Notes (Signed)
PCP - Corena Pilgrim. Grandville Silos, MD Cardiologist - Denies  PPM/ICD - Denies  Chest x-ray - N/A EKG - 10/08/20 Stress Test - Denies ECHO - Denies Cardiac Cath - Denies  Sleep Study - Yes CPAP - Yes  Fasting Blood Sugar - 120s-130s Checks Blood Sugar x2 weekly A1C OBTAINED  Blood Thinner Instructions: N/A Aspirin Instructions: N/A  ERAS Protcol - Yes PRE-SURGERY Ensure or G2- G2 given  COVID TEST- Pt instructed to go to [facility] Friday, August 19th. Pt given MAP and Order requisition form.   Anesthesia review: No  Patient denies shortness of breath, fever, cough and chest pain at PAT appointment   All instructions explained to the patient, with a verbal understanding of the material. Patient agrees to go over the instructions while at home for a better understanding. The opportunity to ask questions was provided.

## 2020-10-08 NOTE — Pre-Procedure Instructions (Addendum)
Surgical Instructions    Your procedure is scheduled on Tuesday, August 23rd.  Report to [facility] Main Entrance "A" at 10:00 A.M., then check in with the Admitting office.  Call this number if you have problems the morning of surgery:  [phone]   If you have any questions prior to your surgery date call [phone]: Open Monday-Friday 8am-4pm    Remember:  Do not eat after midnight the night before your surgery  You may drink clear liquids until 9:00 a.m. the morning of your surgery.   Clear liquids allowed are: Water, Non-Citrus Juices (without pulp), Carbonated Beverages, Clear Tea, Black Coffee Only, and Gatorade.   Enhanced Recovery after Surgery for Orthopedics Enhanced Recovery after Surgery is a protocol used to improve the stress on your body and your recovery after surgery.  The day of surgery (if you have diabetes):  Drink ONE small bottle of G2 Gatorade by 08:00 AM the morning of surgery This bottle was given to you during your hospital pre-op appointment visit.  Nothing else to drink after completing the Small 10 oz bottle of G2 Gatorade.         If you have questions, please contact your surgeon's office.            Take these medicines the morning of surgery with A SIP OF WATER  atorvastatin (LIPITOR)  BRIMONIDINE TARTRATE OP eye drops febuxostat (ULORIC)   If needed: acetaminophen (TYLENOL)  As of today, STOP taking any Aspirin (unless otherwise instructed by your surgeon) Aleve, Naproxen, Ibuprofen, Motrin, Advil, Goody's, BC's, all herbal medications, fish oil, and all vitamins. This includes: diclofenac (VOLTAREN) and Ibuprofen-Famotidine, meloxicam (MOBIC).           WHAT DO I DO ABOUT MY DIABETES MEDICATION?  Do not take metFORMIN (GLUCOPHAGE-XR) the morning of surgery.   HOW TO MANAGE YOUR DIABETES BEFORE AND AFTER SURGERY  Why is it important to control my blood sugar before and after surgery? Improving blood sugar levels before and  after surgery helps healing and can limit problems. A way of improving blood sugar control is eating a healthy diet by:  Eating less sugar and carbohydrates  Increasing activity/exercise  Talking with your doctor about reaching your blood sugar goals High blood sugars (greater than 180 mg/dL) can raise your risk of infections and slow your recovery, so you will need to focus on controlling your diabetes during the weeks before surgery. Make sure that the doctor who takes care of your diabetes knows about your planned surgery including the date and location.  How do I manage my blood sugar before surgery? Check your blood sugar at least 4 times a day, starting 2 days before surgery, to make sure that the level is not too high or low.  Check your blood sugar the morning of your surgery when you wake up and every 2 hours until you get to the Short Stay unit.  If your blood sugar is less than 70 mg/dL, you will need to treat for low blood sugar: Do not take insulin. Treat a low blood sugar (less than 70 mg/dL) with  cup of clear juice (cranberry or apple), 4 glucose tablets, OR glucose gel. Recheck blood sugar in 15 minutes after treatment (to make sure it is greater than 70 mg/dL). If your blood sugar is not greater than 70 mg/dL on recheck, call [phone] for further instructions. Report your blood sugar to the short stay nurse when you get to Short Stay.  If you  are admitted to the hospital after surgery: Your blood sugar will be checked by the staff and you will probably be given insulin after surgery (instead of oral diabetes medicines) to make sure you have good blood sugar levels. The goal for blood sugar control after surgery is 80-180 mg/dL.              Do NOT Smoke (Tobacco/Vaping) or drink Alcohol 24 hours prior to your procedure.  If you use a CPAP at night, you may bring all equipment for your overnight stay.   Contacts, glasses, piercing's, hearing aid's, dentures or  partials may not be worn into surgery, please bring cases for these belongings.    For patients admitted to the hospital, discharge time will be determined by your treatment team.   Patients discharged the day of surgery will not be allowed to drive home, and someone needs to stay with them for 24 hours.  ONLY 1 SUPPORT PERSON MAY BE PRESENT WHILE YOU ARE IN SURGERY. IF YOU ARE TO BE ADMITTED ONCE YOU ARE IN YOUR ROOM YOU WILL BE ALLOWED TWO (2) VISITORS.  Minor children may have two parents present. Special consideration for safety and communication needs will be reviewed on a case by case basis.   Special instructions:   [place]- Preparing For Surgery  Before surgery, you can play an important role. Because skin is not sterile, your skin needs to be as free of germs as possible. You can reduce the number of germs on your skin by washing with CHG (chlorahexidine gluconate) Soap before surgery.  CHG is an antiseptic cleaner which kills germs and bonds with the skin to continue killing germs even after washing.    Oral Hygiene is also important to reduce your risk of infection.  Remember - BRUSH YOUR TEETH THE MORNING OF SURGERY WITH YOUR REGULAR TOOTHPASTE  Please do not use if you have an allergy to CHG or antibacterial soaps. If your skin becomes reddened/irritated stop using the CHG.  Do not shave (including legs and underarms) for at least 48 hours prior to first CHG shower. It is OK to shave your face.  Please follow these instructions carefully.   Shower the NIGHT BEFORE SURGERY and the MORNING OF SURGERY  If you chose to wash your hair, wash your hair first as usual with your normal shampoo.  After you shampoo, rinse your hair and body thoroughly to remove the shampoo.  Use CHG Soap as you would any other liquid soap. You can apply CHG directly to the skin and wash gently with a scrungie or a clean washcloth.   Apply the CHG Soap to your body ONLY FROM THE NECK DOWN.  Do not  use on open wounds or open sores. Avoid contact with your eyes, ears, mouth and genitals (private parts). Wash Face and genitals (private parts)  with your normal soap.   Wash thoroughly, paying special attention to the area where your surgery will be performed.  Thoroughly rinse your body with warm water from the neck down.  DO NOT shower/wash with your normal soap after using and rinsing off the CHG Soap.  Pat yourself dry with a CLEAN TOWEL.  Wear CLEAN PAJAMAS to bed the night before surgery  Place CLEAN SHEETS on your bed the night before your surgery  DO NOT SLEEP WITH PETS.   Day of Surgery: Shower with CHG soap. Do not wear jewelry. Do not wear lotions, powders, colognes, or deodorant. Men may shave face and  neck. Do not bring valuables to the hospital. [facility] is not responsible for any belongings or valuables. Wear Clean/Comfortable clothing the morning of surgery Remember to brush your teeth WITH YOUR REGULAR TOOTHPASTE.   Please read over the following fact sheets that you were given.

## 2020-10-08 NOTE — Telephone Encounter (Signed)
Pt is having surgery and wondering what kind of things will he need after surgery?  He wants a call back today if possible. [phone]

## 2020-10-09 NOTE — Telephone Encounter (Signed)
Yes, you know that request for services form you fill out and send in.

## 2020-10-11 ENCOUNTER — Other Ambulatory Visit: Payer: Self-pay | Admitting: Orthopaedic Surgery

## 2020-10-11 LAB — SARS CORONAVIRUS 2 (TAT 6-24 HRS): SARS Coronavirus 2: NEGATIVE

## 2020-10-14 NOTE — Anesthesia Preprocedure Evaluation (Addendum)
Anesthesia Evaluation  Patient identified by MRN, date of birth, ID band Patient awake    Reviewed: Allergy & Precautions, NPO status , Patient's Chart, lab work & pertinent test results  History of Anesthesia Complications Negative for: history of anesthetic complications  Airway Mallampati: III  TM Distance: >3 FB Neck ROM: Full    Dental  (+) Missing,    Pulmonary sleep apnea ,    Pulmonary exam normal        Cardiovascular hypertension, Pt. on medications Normal cardiovascular exam     Neuro/Psych negative neurological ROS  negative psych ROS   GI/Hepatic Neg liver ROS, PUD,   Endo/Other  diabetes, Type 2, Oral Hypoglycemic Agents  Renal/GU Cr 1.57  negative genitourinary   Musculoskeletal  (+) Arthritis ,   Abdominal   Peds  Hematology Hgb 10.9, plts 287k   Anesthesia Other Findings glaucoma  Reproductive/Obstetrics negative OB ROS                            Anesthesia Physical Anesthesia Plan  ASA: 2  Anesthesia Plan: Spinal   Post-op Pain Management:    Induction:   PONV Risk Score and Plan: 2 and Treatment may vary due to age or medical condition, TIVA, Propofol infusion, Dexamethasone and Ondansetron  Airway Management Planned: Natural Airway and Simple Face Mask  Additional Equipment: None  Intra-op Plan:   Post-operative Plan:   Informed Consent: I have reviewed the patients History and Physical, chart, labs and discussed the procedure including the risks, benefits and alternatives for the proposed anesthesia with the patient or authorized representative who has indicated his/her understanding and acceptance.       Plan Discussed with: CRNA  Anesthesia Plan Comments:        Anesthesia Quick Evaluation

## 2020-10-15 ENCOUNTER — Encounter (HOSPITAL_COMMUNITY)
Admission: RE | Disposition: A | Discharge status: Home / Self Care | Payer: Self-pay | Source: Home / Self Care | Attending: Orthopaedic Surgery

## 2020-10-15 ENCOUNTER — Observation Stay (HOSPITAL_COMMUNITY)
Admission: RE | Admit: 2020-10-15 | Discharge: 2020-10-16 | Disposition: A | Discharge status: Home / Self Care | Payer: No Typology Code available for payment source | Attending: Orthopaedic Surgery | Admitting: Orthopaedic Surgery

## 2020-10-15 ENCOUNTER — Ambulatory Visit (HOSPITAL_COMMUNITY): Payer: No Typology Code available for payment source | Admitting: Anesthesiology

## 2020-10-15 ENCOUNTER — Encounter (HOSPITAL_COMMUNITY): Payer: Self-pay | Admitting: Orthopaedic Surgery

## 2020-10-15 ENCOUNTER — Observation Stay (HOSPITAL_COMMUNITY): Payer: No Typology Code available for payment source

## 2020-10-15 ENCOUNTER — Ambulatory Visit (HOSPITAL_COMMUNITY): Payer: No Typology Code available for payment source

## 2020-10-15 DIAGNOSIS — I10 Essential (primary) hypertension: Secondary | ICD-10-CM | POA: Diagnosis not present

## 2020-10-15 DIAGNOSIS — Z8546 Personal history of malignant neoplasm of prostate: Secondary | ICD-10-CM | POA: Insufficient documentation

## 2020-10-15 DIAGNOSIS — Z96642 Presence of left artificial hip joint: Secondary | ICD-10-CM

## 2020-10-15 DIAGNOSIS — M1612 Unilateral primary osteoarthritis, left hip: Secondary | ICD-10-CM | POA: Diagnosis present

## 2020-10-15 DIAGNOSIS — E119 Type 2 diabetes mellitus without complications: Secondary | ICD-10-CM | POA: Diagnosis not present

## 2020-10-15 DIAGNOSIS — Z419 Encounter for procedure for purposes other than remedying health state, unspecified: Secondary | ICD-10-CM

## 2020-10-15 HISTORY — PX: TOTAL HIP ARTHROPLASTY: SHX124

## 2020-10-15 LAB — GLUCOSE, CAPILLARY
Glucose-Capillary: 119 mg/dL — ABNORMAL HIGH (ref 70–99)
Glucose-Capillary: 111 mg/dL — ABNORMAL HIGH (ref 70–99)
Glucose-Capillary: 133 mg/dL — ABNORMAL HIGH (ref 70–99)
Glucose-Capillary: 184 mg/dL — ABNORMAL HIGH (ref 70–99)

## 2020-10-15 SURGERY — ARTHROPLASTY, HIP, TOTAL, ANTERIOR APPROACH
Anesthesia: Spinal | Site: Hip | Laterality: Left

## 2020-10-15 MED ORDER — CEFAZOLIN SODIUM-DEXTROSE 1-4 GM/50ML-% IV SOLN
1.0000 g | Freq: Four times a day (QID) | INTRAVENOUS | Status: AC
  Administered 2020-10-15 – 2020-10-16 (×2): 1 g via INTRAVENOUS
  Filled 2020-10-15 (×2): qty 50

## 2020-10-15 MED ORDER — ACETAMINOPHEN 500 MG PO TABS
1000.0000 mg | ORAL_TABLET | Freq: Once | ORAL | Status: AC
  Administered 2020-10-15: 1000 mg via ORAL
  Filled 2020-10-15: qty 2

## 2020-10-15 MED ORDER — POLYETHYLENE GLYCOL 3350 17 G PO PACK: 17.0000 g | PACK | Freq: Every day | ORAL | Status: DC | PRN

## 2020-10-15 MED ORDER — HYDROMORPHONE HCL 1 MG/ML IJ SOLN: 0.5000 mg | INTRAMUSCULAR | Status: DC | PRN

## 2020-10-15 MED ORDER — ATORVASTATIN CALCIUM 10 MG PO TABS
10.0000 mg | ORAL_TABLET | Freq: Every day | ORAL | Status: DC
  Administered 2020-10-15 – 2020-10-16 (×2): 10 mg via ORAL
  Filled 2020-10-15 (×2): qty 1

## 2020-10-15 MED ORDER — OXYCODONE HCL 5 MG PO TABS: 10.0000 mg | ORAL_TABLET | ORAL | Status: DC | PRN

## 2020-10-15 MED ORDER — CEFAZOLIN SODIUM-DEXTROSE 2-4 GM/100ML-% IV SOLN
2.0000 g | INTRAVENOUS | Status: AC
  Administered 2020-10-15: 2 g via INTRAVENOUS
  Filled 2020-10-15: qty 100

## 2020-10-15 MED ORDER — MENTHOL 3 MG MT LOZG: 1.0000 | LOZENGE | OROMUCOSAL | Status: DC | PRN

## 2020-10-15 MED ORDER — FENTANYL CITRATE (PF) 100 MCG/2ML IJ SOLN
INTRAMUSCULAR | Status: DC | PRN
  Administered 2020-10-15: 50 ug via INTRAVENOUS

## 2020-10-15 MED ORDER — OXYCODONE HCL 5 MG PO TABS
ORAL_TABLET | ORAL | Status: AC
  Filled 2020-10-15: qty 1

## 2020-10-15 MED ORDER — OXYCODONE HCL 5 MG PO TABS
5.0000 mg | ORAL_TABLET | Freq: Once | ORAL | Status: DC | PRN
  Administered 2020-10-15: 5 mg via ORAL

## 2020-10-15 MED ORDER — CHLORTHALIDONE 25 MG PO TABS
25.0000 mg | ORAL_TABLET | Freq: Every day | ORAL | Status: DC
  Filled 2020-10-15: qty 1

## 2020-10-15 MED ORDER — METFORMIN HCL ER 750 MG PO TB24
750.0000 mg | ORAL_TABLET | Freq: Every day | ORAL | Status: DC
  Administered 2020-10-16: 750 mg via ORAL
  Filled 2020-10-15 (×3): qty 1

## 2020-10-15 MED ORDER — SODIUM CHLORIDE 0.9 % IV SOLN: INTRAVENOUS | Status: DC

## 2020-10-15 MED ORDER — TRANEXAMIC ACID-NACL 1000-0.7 MG/100ML-% IV SOLN
1000.0000 mg | INTRAVENOUS | Status: AC
  Administered 2020-10-15: 1000 mg via INTRAVENOUS
  Filled 2020-10-15: qty 100

## 2020-10-15 MED ORDER — ONDANSETRON HCL 4 MG PO TABS: 4.0000 mg | ORAL_TABLET | Freq: Four times a day (QID) | ORAL | Status: DC | PRN

## 2020-10-15 MED ORDER — FENTANYL CITRATE (PF) 100 MCG/2ML IJ SOLN: 25.0000 ug | INTRAMUSCULAR | Status: DC | PRN

## 2020-10-15 MED ORDER — ACETAMINOPHEN 325 MG PO TABS: 325.0000 mg | ORAL_TABLET | Freq: Four times a day (QID) | ORAL | Status: DC | PRN

## 2020-10-15 MED ORDER — METOCLOPRAMIDE HCL 5 MG PO TABS: 5.0000 mg | ORAL_TABLET | Freq: Three times a day (TID) | ORAL | Status: DC | PRN

## 2020-10-15 MED ORDER — OXYCODONE HCL 5 MG/5ML PO SOLN: 5.0000 mg | Freq: Once | ORAL | Status: DC | PRN

## 2020-10-15 MED ORDER — BRIMONIDINE TARTRATE 0.2 % OP SOLN
1.0000 [drp] | Freq: Three times a day (TID) | OPHTHALMIC | Status: DC
  Administered 2020-10-15 – 2020-10-16 (×2): 1 [drp] via OPHTHALMIC
  Filled 2020-10-15: qty 5

## 2020-10-15 MED ORDER — PHENOL 1.4 % MT LIQD: 1.0000 | OROMUCOSAL | Status: DC | PRN

## 2020-10-15 MED ORDER — BUPIVACAINE HCL (PF) 0.5 % IJ SOLN
INTRAMUSCULAR | Status: AC
  Filled 2020-10-15: qty 10

## 2020-10-15 MED ORDER — POVIDONE-IODINE 10 % EX SWAB: 2.0000 "application " | Freq: Once | CUTANEOUS | Status: DC

## 2020-10-15 MED ORDER — FENTANYL CITRATE (PF) 250 MCG/5ML IJ SOLN
INTRAMUSCULAR | Status: AC
  Filled 2020-10-15: qty 5

## 2020-10-15 MED ORDER — LACTATED RINGERS IV SOLN: INTRAVENOUS | Status: DC

## 2020-10-15 MED ORDER — SODIUM CHLORIDE 0.9 % IR SOLN
Status: DC | PRN
  Administered 2020-10-15: 3000 mL

## 2020-10-15 MED ORDER — METOCLOPRAMIDE HCL 5 MG/ML IJ SOLN: 5.0000 mg | Freq: Three times a day (TID) | INTRAMUSCULAR | Status: DC | PRN

## 2020-10-15 MED ORDER — LISINOPRIL 20 MG PO TABS
40.0000 mg | ORAL_TABLET | Freq: Every day | ORAL | Status: DC
  Administered 2020-10-15: 40 mg via ORAL
  Filled 2020-10-15 (×2): qty 2

## 2020-10-15 MED ORDER — PHENYLEPHRINE HCL-NACL 20-0.9 MG/250ML-% IV SOLN
INTRAVENOUS | Status: DC | PRN
  Administered 2020-10-15: 50 ug/min via INTRAVENOUS

## 2020-10-15 MED ORDER — ASPIRIN 81 MG PO CHEW
81.0000 mg | CHEWABLE_TABLET | Freq: Two times a day (BID) | ORAL | Status: DC
  Administered 2020-10-15 – 2020-10-16 (×2): 81 mg via ORAL
  Filled 2020-10-15 (×2): qty 1

## 2020-10-15 MED ORDER — METHOCARBAMOL 1000 MG/10ML IJ SOLN
500.0000 mg | Freq: Four times a day (QID) | INTRAVENOUS | Status: DC | PRN
  Filled 2020-10-15: qty 5

## 2020-10-15 MED ORDER — PANTOPRAZOLE SODIUM 40 MG PO TBEC
40.0000 mg | DELAYED_RELEASE_TABLET | Freq: Every day | ORAL | Status: DC
  Administered 2020-10-15 – 2020-10-16 (×2): 40 mg via ORAL
  Filled 2020-10-15 (×2): qty 1

## 2020-10-15 MED ORDER — BRINZOLAMIDE 1 % OP SUSP
1.0000 [drp] | Freq: Three times a day (TID) | OPHTHALMIC | Status: DC
  Administered 2020-10-15 – 2020-10-16 (×2): 1 [drp] via OPHTHALMIC
  Filled 2020-10-15: qty 10

## 2020-10-15 MED ORDER — DOCUSATE SODIUM 100 MG PO CAPS
100.0000 mg | ORAL_CAPSULE | Freq: Two times a day (BID) | ORAL | Status: DC
  Administered 2020-10-15 – 2020-10-16 (×2): 100 mg via ORAL
  Filled 2020-10-15 (×2): qty 1

## 2020-10-15 MED ORDER — EPHEDRINE SULFATE 50 MG/ML IJ SOLN
INTRAMUSCULAR | Status: DC | PRN
  Administered 2020-10-15 (×2): 5 mg via INTRAVENOUS

## 2020-10-15 MED ORDER — CHLORHEXIDINE GLUCONATE 0.12 % MT SOLN
15.0000 mL | Freq: Once | OROMUCOSAL | Status: AC
  Administered 2020-10-15: 15 mL via OROMUCOSAL
  Filled 2020-10-15: qty 15

## 2020-10-15 MED ORDER — MIDAZOLAM HCL 5 MG/5ML IJ SOLN
INTRAMUSCULAR | Status: DC | PRN
  Administered 2020-10-15: 1 mg via INTRAVENOUS

## 2020-10-15 MED ORDER — 0.9 % SODIUM CHLORIDE (POUR BTL) OPTIME
TOPICAL | Status: DC | PRN
  Administered 2020-10-15: 1000 mL

## 2020-10-15 MED ORDER — ALUM & MAG HYDROXIDE-SIMETH 200-200-20 MG/5ML PO SUSP: 30.0000 mL | ORAL | Status: DC | PRN

## 2020-10-15 MED ORDER — MIDAZOLAM HCL 2 MG/2ML IJ SOLN
INTRAMUSCULAR | Status: AC
  Filled 2020-10-15: qty 2

## 2020-10-15 MED ORDER — BUPIVACAINE IN DEXTROSE 0.75-8.25 % IT SOLN
INTRATHECAL | Status: DC | PRN
  Administered 2020-10-15: 1.6 mL via INTRATHECAL

## 2020-10-15 MED ORDER — BRINZOLAMIDE-BRIMONIDINE 1-0.2 % OP SUSP: 1.0000 [drp] | Freq: Three times a day (TID) | OPHTHALMIC | Status: DC

## 2020-10-15 MED ORDER — PROPOFOL 10 MG/ML IV BOLUS
INTRAVENOUS | Status: AC
  Filled 2020-10-15: qty 20

## 2020-10-15 MED ORDER — OXYCODONE HCL 5 MG PO TABS
5.0000 mg | ORAL_TABLET | ORAL | Status: DC | PRN
  Administered 2020-10-15 – 2020-10-16 (×4): 10 mg via ORAL
  Filled 2020-10-15 (×5): qty 2

## 2020-10-15 MED ORDER — PROPOFOL 500 MG/50ML IV EMUL
INTRAVENOUS | Status: DC | PRN
  Administered 2020-10-15: 50 ug/kg/min via INTRAVENOUS

## 2020-10-15 MED ORDER — DIPHENHYDRAMINE HCL 12.5 MG/5ML PO ELIX
12.5000 mg | ORAL_SOLUTION | ORAL | Status: DC | PRN
  Filled 2020-10-15: qty 10

## 2020-10-15 MED ORDER — METHOCARBAMOL 500 MG PO TABS
500.0000 mg | ORAL_TABLET | Freq: Four times a day (QID) | ORAL | Status: DC | PRN
  Administered 2020-10-15 – 2020-10-16 (×4): 500 mg via ORAL
  Filled 2020-10-15 (×4): qty 1

## 2020-10-15 MED ORDER — ORAL CARE MOUTH RINSE
15.0000 mL | Freq: Once | OROMUCOSAL | Status: AC
Start: 2020-10-15 — End: 2020-10-15

## 2020-10-15 MED ORDER — PHENYLEPHRINE HCL (PRESSORS) 10 MG/ML IV SOLN: INTRAVENOUS | Status: DC | PRN

## 2020-10-15 MED ORDER — ONDANSETRON HCL 4 MG/2ML IJ SOLN: 4.0000 mg | Freq: Four times a day (QID) | INTRAMUSCULAR | Status: DC | PRN

## 2020-10-15 SURGICAL SUPPLY — 54 items
BAG COUNTER SPONGE SURGICOUNT (BAG) ×2 IMPLANT
BENZOIN TINCTURE PRP APPL 2/3 (GAUZE/BANDAGES/DRESSINGS) ×2 IMPLANT
BLADE CLIPPER SURG (BLADE) IMPLANT
BLADE SAW SGTL 18X1.27X75 (BLADE) ×2 IMPLANT
COVER SURGICAL LIGHT HANDLE (MISCELLANEOUS) ×2 IMPLANT
DRAPE C-ARM 42X72 X-RAY (DRAPES) ×2 IMPLANT
DRAPE STERI IOBAN 125X83 (DRAPES) ×2 IMPLANT
DRAPE U-SHAPE 47X51 STRL (DRAPES) ×6 IMPLANT
DRSG AQUACEL AG ADV 3.5X10 (GAUZE/BANDAGES/DRESSINGS) ×2 IMPLANT
DURAPREP 26ML APPLICATOR (WOUND CARE) ×2 IMPLANT
ELECT BLADE 4.0 EZ CLEAN MEGAD (MISCELLANEOUS) ×2
ELECT BLADE 6.5 EXT (BLADE) IMPLANT
ELECT REM PT RETURN 9FT ADLT (ELECTROSURGICAL) ×2
ELECTRODE BLDE 4.0 EZ CLN MEGD (MISCELLANEOUS) ×1 IMPLANT
ELECTRODE REM PT RTRN 9FT ADLT (ELECTROSURGICAL) ×1 IMPLANT
FACESHIELD WRAPAROUND (MASK) ×6 IMPLANT
GLOVE SRG 8 PF TXTR STRL LF DI (GLOVE) ×2 IMPLANT
GLOVE SURG LTX SZ8 (GLOVE) ×2 IMPLANT
GLOVE SURG ORTHO LTX SZ7.5 (GLOVE) ×4 IMPLANT
GLOVE SURG UNDER POLY LF SZ8 (GLOVE) ×2
GOWN STRL REUS W/ TWL LRG LVL3 (GOWN DISPOSABLE) ×3 IMPLANT
GOWN STRL REUS W/ TWL XL LVL3 (GOWN DISPOSABLE) ×2 IMPLANT
GOWN STRL REUS W/TWL LRG LVL3 (GOWN DISPOSABLE) ×3
GOWN STRL REUS W/TWL XL LVL3 (GOWN DISPOSABLE) ×2
HANDPIECE INTERPULSE COAX TIP (DISPOSABLE) ×1
HEAD M SROM 36MM PLUS 1.5 (Hips) ×1 IMPLANT
KIT BASIN OR (CUSTOM PROCEDURE TRAY) ×2 IMPLANT
KIT TURNOVER KIT B (KITS) ×2 IMPLANT
LINER NEUTRAL 52X36MM PLUS 4 (Liner) ×2 IMPLANT
MANIFOLD NEPTUNE II (INSTRUMENTS) ×2 IMPLANT
NS IRRIG 1000ML POUR BTL (IV SOLUTION) ×2 IMPLANT
PACK TOTAL JOINT (CUSTOM PROCEDURE TRAY) ×2 IMPLANT
PAD ARMBOARD 7.5X6 YLW CONV (MISCELLANEOUS) ×2 IMPLANT
PIN SECTOR W/GRIP ACE CUP 52MM (Hips) ×2 IMPLANT
SET HNDPC FAN SPRY TIP SCT (DISPOSABLE) ×1 IMPLANT
SPONGE T-LAP 18X18 ~~LOC~~+RFID (SPONGE) ×6 IMPLANT
SROM M HEAD 36MM PLUS 1.5 (Hips) ×2 IMPLANT
STAPLER VISISTAT 35W (STAPLE) ×2 IMPLANT
STEM CORAIL KA12 (Stem) ×2 IMPLANT
STRIP CLOSURE SKIN 1/2X4 (GAUZE/BANDAGES/DRESSINGS) ×4 IMPLANT
SUT ETHIBOND NAB CT1 #1 30IN (SUTURE) ×2 IMPLANT
SUT MNCRL AB 4-0 PS2 18 (SUTURE) IMPLANT
SUT VIC AB 0 CT1 27 (SUTURE) ×1
SUT VIC AB 0 CT1 27XBRD ANBCTR (SUTURE) ×1 IMPLANT
SUT VIC AB 1 CT1 27 (SUTURE) ×1
SUT VIC AB 1 CT1 27XBRD ANBCTR (SUTURE) ×1 IMPLANT
SUT VIC AB 2-0 CT1 27 (SUTURE) ×1
SUT VIC AB 2-0 CT1 TAPERPNT 27 (SUTURE) ×1 IMPLANT
TOWEL GREEN STERILE (TOWEL DISPOSABLE) ×2 IMPLANT
TOWEL GREEN STERILE FF (TOWEL DISPOSABLE) ×2 IMPLANT
TRAY CATH 16FR W/PLASTIC CATH (SET/KITS/TRAYS/PACK) IMPLANT
TRAY FOLEY W/BAG SLVR 16FR (SET/KITS/TRAYS/PACK) ×1
TRAY FOLEY W/BAG SLVR 16FR ST (SET/KITS/TRAYS/PACK) ×1 IMPLANT
WATER STERILE IRR 1000ML POUR (IV SOLUTION) IMPLANT

## 2020-10-15 NOTE — Evaluation (Signed)
Physical Therapy Evaluation Patient Details Name: Todd Gates MRN: [id number] DOB: 06/11/1945 Today's Date: 10/15/2020   History of Present Illness  Pt is 75 yo male s/p L anterior THA on 10/15/20.  He has hx including OA, prostate, CA, DM, and gout.  Clinical Impression  Pt is s/p THA resulting in the deficits listed below (see PT Problem List). At baseline, pt is independent with all activities and lives with his wife who has dementia.  He will have assist from his sister at discharge. Pt seen for evaluation on DOS, and did very well.  He ambulated 67' and with good pain control.  Pt was very pleased with his progress.  Pt with excellent rehab potential and is very motivated.  Pt will benefit from skilled PT to increase their independence and safety with mobility to allow discharge to the venue listed below.      Follow Up Recommendations Follow surgeon's recommendation for DC plan and follow-up therapies;Supervision for mobility/OOB    Equipment Recommendations  Rolling walker with 5" wheels    Recommendations for Other Services       Precautions / Restrictions Precautions Precautions: Fall Restrictions LLE Weight Bearing: Weight bearing as tolerated      Mobility  Bed Mobility Overal bed mobility: Needs Assistance Bed Mobility: Supine to Sit;Sit to Supine     Supine to sit: Min assist Sit to supine: Min assist   General bed mobility comments: light Min A for L LE    Transfers Overall transfer level: Needs assistance Equipment used: Rolling walker (2 wheeled) Transfers: Sit to/from Stand Sit to Stand: Min guard         General transfer comment: min guard for safety with cues for hand placement and L LE management  Ambulation/Gait Ambulation/Gait assistance: Min guard Gait Distance (Feet): 80 Feet Assistive device: Rolling walker (2 wheeled) Gait Pattern/deviations: Step-through pattern;Decreased stride length;Decreased stance time - right     General Gait  Details: Min cues for RW proximity; min guard for safety  Stairs            Wheelchair Mobility    Modified Rankin (Stroke Patients Only)       Balance Overall balance assessment: Needs assistance Sitting-balance support: No upper extremity supported;Feet supported Sitting balance-Leahy Scale: Good     Standing balance support: No upper extremity supported;Bilateral upper extremity supported Standing balance-Leahy Scale: Fair Standing balance comment: static stand without support; RW to ambulate                             Pertinent Vitals/Pain Pain Assessment: 0-10 Pain Score: 2  Pain Location: L hip Pain Descriptors / Indicators: Sore Pain Intervention(s): Limited activity within patient's tolerance;Monitored during session;Repositioned;Ice applied    Home Living Family/patient expects to be discharged to:: Private residence Living Arrangements: Spouse/significant other;Other relatives Available Help at Discharge: Family;Available 24 hours/day (spouse has dementia; but pt's sister to stay and assist for 2 weeks) Type of Home: House Home Access: Level entry     Home Layout: Multi-level;Able to live on main level with bedroom/bathroom;Laundry or work area in [employer]: [place];Toilet riser Additional Comments: states has BSC at sister's if needed; has toilet riser and sink next to toilet    Prior Function Level of Independence: Independent         Comments: Independent with ADLs, IADLs, driving, all IADLS, and community ambulation     Hand Dominance  Extremity/Trunk Assessment   Upper Extremity Assessment Upper Extremity Assessment: Overall WFL for tasks assessed    Lower Extremity Assessment Lower Extremity Assessment: LLE deficits/detail LLE Deficits / Details: Expected post op changes; ROM WFL; MMT: ankle 5/5, knee 3/5, hip 2/5    Cervical / Trunk Assessment Cervical / Trunk Assessment: Normal   Communication   Communication: No difficulties  Cognition Arousal/Alertness: Awake/alert Behavior During Therapy: WFL for tasks assessed/performed Overall Cognitive Status: Within Functional Limits for tasks assessed                                        General Comments General comments (skin integrity, edema, etc.): Encouraged to perform ankle pumps and quad sets tonight    Exercises     Assessment/Plan    PT Assessment Patient needs continued PT services  PT Problem List Decreased strength;Decreased mobility;Decreased range of motion;Decreased activity tolerance;Decreased balance;Decreased knowledge of use of DME;Pain       PT Treatment Interventions DME instruction;Therapeutic activities;Modalities;Gait training;Therapeutic exercise;Patient/family education;Stair training;Functional mobility training;Balance training    PT Goals (Current goals can be found in the Care Plan section)  Acute Rehab PT Goals Patient Stated Goal: return hom tomorrow PT Goal Formulation: With patient Time For Goal Achievement: 10/29/20 Potential to Achieve Goals: Good    Frequency 7X/week   Barriers to discharge        Co-evaluation               AM-PAC PT "6 Clicks" Mobility  Outcome Measure Help needed turning from your back to your side while in a flat bed without using bedrails?: A Little Help needed moving from lying on your back to sitting on the side of a flat bed without using bedrails?: A Little Help needed moving to and from a bed to a chair (including a wheelchair)?: A Little Help needed standing up from a chair using your arms (e.g., wheelchair or bedside chair)?: A Little Help needed to walk in hospital room?: A Little Help needed climbing 3-5 steps with a railing? : A Little 6 Click Score: 18    End of Session Equipment Utilized During Treatment: Gait belt Activity Tolerance: Patient tolerated treatment well Patient left: in bed;with call  bell/phone within reach;with bed alarm set;with SCD's reapplied Nurse Communication: Mobility status PT Visit Diagnosis: Other abnormalities of gait and mobility (R26.89);Muscle weakness (generalized) (M62.81)    Time: [id number] PT Time Calculation (min) (ACUTE ONLY): 24 min   Charges:   PT Evaluation $PT Eval Low Complexity: 1 Low PT Treatments $Gait Training: 8-22 mins        Abran Richard, PT Acute Rehab Services Pager [phone] [facility] [place] 10/15/2020, 5:19 PM

## 2020-10-15 NOTE — Transfer of Care (Signed)
Immediate Anesthesia Transfer of Care Note  Patient: Todd Gates  Procedure(s) Performed: LEFT TOTAL HIP ARTHROPLASTY ANTERIOR APPROACH (Left: Hip)  Patient Location: PACU  Anesthesia Type:Spinal  Level of Consciousness: awake, alert , oriented and patient cooperative  Airway & Oxygen Therapy: Patient Spontanous Breathing  Post-op Assessment: Report given to RN and Post -op Vital signs reviewed and stable  Post vital signs: Reviewed and stable  Last Vitals:  Vitals Value Taken Time  BP 95/46 10/15/20 1400  Temp 36.1 C 10/15/20 1358  Pulse 49 10/15/20 1358  Resp 13 10/15/20 1407  SpO2 100 % 10/15/20 1358  Vitals shown include unvalidated device data.  Last Pain:  Vitals:   10/15/20 1358  TempSrc:   PainSc: 0-No pain         Complications: No notable events documented.

## 2020-10-15 NOTE — Discharge Instructions (Signed)

## 2020-10-15 NOTE — Anesthesia Procedure Notes (Signed)
Spinal  Patient location during procedure: OR Start time: 10/15/2020 12:20 PM End time: 10/15/2020 12:23 PM Reason for block: surgical anesthesia Staffing Performed: anesthesiologist  Anesthesiologist: Brennan Bailey, MD Preanesthetic Checklist Completed: patient identified, IV checked, risks and benefits discussed, surgical consent, monitors and equipment checked, pre-op evaluation and timeout performed Spinal Block Patient position: sitting Prep: DuraPrep and site prepped and draped Patient monitoring: continuous pulse ox, blood pressure and heart rate Approach: midline Location: L3-4 Injection technique: single-shot Needle Needle type: Pencan  Needle gauge: 24 G Needle length: 9 cm Assessment Events: CSF return Additional Notes Risks, benefits, and alternative discussed. Patient gave consent to procedure. Prepped and draped in sitting position. Patient sedated but responsive to voice. Clear CSF obtained after one needle redirection. Positive terminal aspiration. No pain or paraesthesias with injection. Patient tolerated procedure well. Vital signs stable. Tawny Asal, MD

## 2020-10-15 NOTE — Anesthesia Postprocedure Evaluation (Signed)
Anesthesia Post Note  Patient: Todd Gates  Procedure(s) Performed: LEFT TOTAL HIP ARTHROPLASTY ANTERIOR APPROACH (Left: Hip)     Patient location during evaluation: PACU Anesthesia Type: Spinal Level of consciousness: awake and alert and oriented Pain management: pain level controlled Vital Signs Assessment: post-procedure vital signs reviewed and stable Respiratory status: spontaneous breathing, nonlabored ventilation and respiratory function stable Cardiovascular status: blood pressure returned to baseline Postop Assessment: no apparent nausea or vomiting, spinal receding, no headache and no backache Anesthetic complications: no   No notable events documented.  Last Vitals:  Vitals:   10/15/20 1458 10/15/20 1513  BP: (!) 124/52 (!) 119/56  Pulse: (!) 44 (!) 48  Resp: (!) 9 13  Temp: 36.8 C   SpO2: 98% 99%    Last Pain:  Vitals:   10/15/20 1513  TempSrc:   PainSc: 0-No pain                 Marthenia Rolling

## 2020-10-15 NOTE — Plan of Care (Signed)
ERROR

## 2020-10-15 NOTE — Op Note (Signed)
NAMEJAKAIDEN, JABS MEDICAL RECORD NO: [id number] ACCOUNT NO: [id number] DATE OF BIRTH: 04-Aug-1945 FACILITY: [facility] LOCATION: [facility] PHYSICIAN: Lind Guest. Ninfa Linden, MD  Operative Report   DATE OF PROCEDURE: 10/15/2020  PREOPERATIVE DIAGNOSIS:  Primary osteoarthritis and degenerative joint disease, left hip.  POSTOPERATIVE DIAGNOSIS:  Primary osteoarthritis and degenerative joint disease, left hip.  PROCEDURE:  Left total hip arthroplasty through direct anterior approach.  IMPLANTS:  DePuy sector Gription acetabular component size 52, size 36+4 neutral polyethylene liner, size 12 Corail femoral component with standard offset, size 36+1.5 metal hip ball.  SURGEON:  Lind Guest. Ninfa Linden, MD  ASSISTANT:  Benita Stabile, PA-C  ANESTHESIA:  Spinal.  ANTIBIOTICS:  2 g IV Ancef.  ESTIMATED BLOOD LOSS:  200 mL.  COMPLICATIONS:  None.  INDICATIONS:  The patient is a 75 year old veteran with worsening arthritis of his left hip.  This has been verified by plain films, clinical exam, and MRI.  He has had intra-articular steroid injection.  At this point, his left hip pain is daily and it  has become debilitating.  It is detrimentally affecting his mobility, his quality of life, and his activities of daily living to the point he does wish to proceed with total hip arthroplasty.  We had a long and thorough discussion about the risk of acute  blood loss anemia, nerve or vessel injury, fracture, infection, DVT, implant failure, and skin and soft tissue issues.  We talked about our goals being decreased pain, improved mobility and overall improved quality of life.  DESCRIPTION OF PROCEDURE:  After informed consent was obtained, appropriate left hip was marked. He was brought to the operating room and sat up on a stretcher.  Spinal anesthesia was obtained. He was laid in supine position on the stretcher.  Foley  catheter was placed, and traction boots were placed on both his feet.  Next, he was placed  supine on the Hana fracture table, the perineal post in place and both legs in line skeletal traction device and no traction applied.  His left operative hip was  prepped and draped with DuraPrep and sterile drapes.  A timeout was called. He was identified as correct patient, correct left hip.  I then made an incision just inferior and posterior to the anterior superior iliac spine and carried this obliquely down  the leg.  I dissected down tensor fascia lata muscle.  Tensor fascia was then divided longitudinally to proceed with direct anterior approach to the hip.  I identified and cauterized circumflex vessels, then identified the hip capsule, opened up the hip  capsule in L-type format finding a moderate joint effusion.  We placed curved retractors around the medial and lateral femoral neck and made our femoral neck cut with oscillating saw just proximal to the lesser trochanter and completed this with an  osteotome.  I then placed a corkscrew guide in the femoral head and removed the femoral head in its entirety and found a wide area devoid of cartilage.  We then placed a bent Hohmann over the medial acetabular rim and removed remnants of the acetabular  labrum and other debris as well as periarticular osteophytes around the acetabular rim.  We then began reaming under direct visualization from a size 43 reamer in a stepwise increments going up to a size 51 reamer with all reamers placed under direct  visualization, the last reamer was also placed under direct fluoroscopy, so I could obtain our depth of reaming, our inclination, and anteversion.  I then placed real DePuy  sector Gription acetabular component size 52 and a 36+4 neutral polyethylene  liner based off his offset.  Attention was then turned to the femur. With the leg externally rotated to 120 degrees, extended, and adducted, we were able to place a Mueller retractor medially and a Hohman retractor behind the greater trochanter. We  released  lateral joint capsule and used a box cutting osteotome to enter the femoral canal and a rongeur to lateralize, then began broaching using the Corail broaching system from a size 8 going up to a size 12.  With size 12 in place, which was a tight  fit, we trialed a standard neck and a 36+1.5 trial hip ball, reduced this in the acetabulum.  We were pleased with leg length, offset, range of motion, and stability, assessed mechanically and radiographically.  We then dislocated the hip, removed the  trial components.  We placed the real Corail femoral component size 12 with standard offset and the real 36+1.5 metal hip ball and again reduced this in acetabulum.  We appreciated the stability.  We then irrigated the soft tissue with normal saline  solution using pulsatile lavage.  We closed the joint capsule with interrupted #1 Ethibond suture followed by #1 Vicryl to close the tensor fascia.  0 Vicryl was used to close the deep tissue and 2-0 Vicryl was used to close subcutaneous tissue.  The  skin was closed with staples.  An Aquacel dressing was applied.  He was taken off the Hana table and taken to recovery in stable condition with all final counts being correct and no complications noted.   Of note, Benita Stabile, PA-C, did assist during the entire case and assistance was crucial for facilitating every aspect of this case.   [facility] D: 10/15/2020 1:35:33 pm T: 10/15/2020 4:38:00 pm  JOB: [id number]

## 2020-10-15 NOTE — Brief Op Note (Signed)
10/15/2020  1:37 PM  PATIENT:  Todd Gates  75 y.o. male  PRE-OPERATIVE DIAGNOSIS:  severe left hip arthritis  POST-OPERATIVE DIAGNOSIS:  severe left hip arthritis  PROCEDURE:  Procedure(s): LEFT TOTAL HIP ARTHROPLASTY ANTERIOR APPROACH (Left)  SURGEON:  Surgeon(s) and Role:    Mcarthur Rossetti, MD - Primary  PHYSICIAN ASSISTANT:  Benita Stabile, PA-C  ANESTHESIA:   spinal  EBL:  200 mL   COUNTS:  YES  DICTATION: .Other Dictation: Dictation Number [id number]  PLAN OF CARE: Admit for overnight observation  PATIENT DISPOSITION:  PACU - hemodynamically stable.   Delay start of Pharmacological VTE agent (>24hrs) due to surgical blood loss or risk of bleeding: no

## 2020-10-15 NOTE — H&P (Signed)
TOTAL HIP ADMISSION H&P  Patient is admitted for left total hip arthroplasty.  Subjective:  Chief Complaint: left hip pain  HPI: Todd Gates, 75 y.o. male, has a history of pain and functional disability in the left hip(s) due to arthritis and patient has failed non-surgical conservative treatments for greater than 12 weeks to include NSAID's and/or analgesics, corticosteriod injections, use of assistive devices, weight reduction as appropriate, and activity modification.  Onset of symptoms was gradual starting 3 years ago with gradually worsening course since that time.The patient noted no past surgery on the left hip(s).  Patient currently rates pain in the left hip at 9 out of 10 with activity. Patient has night pain, worsening of pain with activity and weight bearing, pain that interfers with activities of daily living, and pain with passive range of motion. Patient has evidence of subchondral cysts, subchondral sclerosis, periarticular osteophytes, and joint space narrowing by imaging studies. This condition presents safety issues increasing the risk of falls.  There is no current active infection.  Patient Active Problem List   Diagnosis Date Noted   Unilateral primary osteoarthritis, left hip 10/15/2020   Achilles tendon contracture, left 02/21/2016   Plantar fasciitis 01/23/2016   Primary osteoarthritis of left knee 07/13/2013   Past Medical History:  Diagnosis Date   Arthritis    Cancer ([place])    prostate; prostatectomy; radiation   Diabetes mellitus without complication ([place])    Fatty liver    Glaucoma    Both eyes   Gout    HTN (hypertension)    Peptic ulcer disease    Sleep apnea     Past Surgical History:  Procedure Laterality Date   COLONOSCOPY W/ POLYPECTOMY  02/2020   PROSTATE SURGERY     PROSTATECTOMY  2008   TONSILLECTOMY      Current Facility-Administered Medications  Medication Dose Route Frequency Provider Last Rate Last Admin   ceFAZolin (ANCEF) IVPB  2g/100 mL premix  2 g Intravenous On Call to OR Pete Pelt, PA-C       lactated ringers infusion   Intravenous Continuous Catalina Gravel, MD 10 mL/hr at 10/15/20 1008 New Bag at 10/15/20 1008   povidone-iodine 10 % swab 2 application  2 application Topical Once Erskine Emery W, PA-C       tranexamic acid (CYKLOKAPRON) IVPB 1,000 mg  1,000 mg Intravenous To OR Pete Pelt, PA-C       No Known Allergies  Social History   Tobacco Use   Smoking status: Unknown   Smokeless tobacco: Never  Substance Use Topics   Alcohol use: No    Family History  Problem Relation Age of Onset   Hypertension Unknown      Review of Systems  Musculoskeletal:  Positive for gait problem.  All other systems reviewed and are negative.  Objective:  Physical Exam Vitals reviewed.  Constitutional:      Appearance: Normal appearance.  HENT:     Head: Normocephalic and atraumatic.  Eyes:     Extraocular Movements: Extraocular movements intact.     Pupils: Pupils are equal, round, and reactive to light.  Cardiovascular:     Rate and Rhythm: Normal rate.     Pulses: Normal pulses.  Pulmonary:     Effort: Pulmonary effort is normal.     Breath sounds: Normal breath sounds.  Abdominal:     Palpations: Abdomen is soft.  Musculoskeletal:     Cervical back: Normal range of motion and neck supple.  Left hip: Tenderness and bony tenderness present. Decreased range of motion. Decreased strength.  Neurological:     Mental Status: He is alert and oriented to person, place, and time.  Psychiatric:        Behavior: Behavior normal.    Vital signs in last 24 hours: Temp:  [97.7 F (36.5 C)] 97.7 F (36.5 C) (08/23 0944) Pulse Rate:  [64] 64 (08/23 0944) Resp:  [19] 19 (08/23 0944) BP: (146)/(51) 146/51 (08/23 0944) SpO2:  [97 %] 97 % (08/23 0944) Weight:  [93.4 kg] 93.4 kg (08/23 0944)  Labs:   Estimated body mass index is 31.32 kg/m as calculated from the following:   Height as  of this encounter: '5\' 8"'$  (1.727 m).   Weight as of this encounter: 93.4 kg.   Imaging Review Plain radiographs demonstrate severe degenerative joint disease of the left hip(s). The bone quality appears to be good for age and reported activity level.      Assessment/Plan:  End stage arthritis, left hip(s)  The patient history, physical examination, clinical judgement of the provider and imaging studies are consistent with end stage degenerative joint disease of the left hip(s) and total hip arthroplasty is deemed medically necessary. The treatment options including medical management, injection therapy, arthroscopy and arthroplasty were discussed at length. The risks and benefits of total hip arthroplasty were presented and reviewed. The risks due to aseptic loosening, infection, stiffness, dislocation/subluxation,  thromboembolic complications and other imponderables were discussed.  The patient acknowledged the explanation, agreed to proceed with the plan and consent was signed. Patient is being admitted for inpatient treatment for surgery, pain control, PT, OT, prophylactic antibiotics, VTE prophylaxis, progressive ambulation and ADL's and discharge planning.The patient is planning to be discharged home with home health services

## 2020-10-16 ENCOUNTER — Telehealth: Payer: Self-pay

## 2020-10-16 DIAGNOSIS — M1612 Unilateral primary osteoarthritis, left hip: Secondary | ICD-10-CM | POA: Diagnosis not present

## 2020-10-16 LAB — BASIC METABOLIC PANEL
Anion gap: 7 (ref 5–15)
BUN: 29 mg/dL — ABNORMAL HIGH (ref 8–23)
CO2: 24 mmol/L (ref 22–32)
Calcium: 9 mg/dL (ref 8.9–10.3)
Chloride: 105 mmol/L (ref 98–111)
Creatinine, Ser: 1.68 mg/dL — ABNORMAL HIGH (ref 0.61–1.24)
GFR, Estimated: 42 mL/min — ABNORMAL LOW (ref >60)
Glucose, Bld: 172 mg/dL — ABNORMAL HIGH (ref 70–99)
Potassium: 4.1 mmol/L (ref 3.5–5.1)
Sodium: 136 mmol/L (ref 135–145)

## 2020-10-16 LAB — CBC
HCT: 29.1 % — ABNORMAL LOW (ref 39.0–52.0)
Hemoglobin: 9.6 g/dL — ABNORMAL LOW (ref 13.0–17.0)
MCH: 30.5 pg (ref 26.0–34.0)
MCHC: 33 g/dL (ref 30.0–36.0)
MCV: 92.4 fL (ref 80.0–100.0)
Platelets: 240 10*3/uL (ref 150–400)
RBC: 3.15 MIL/uL — ABNORMAL LOW (ref 4.22–5.81)
RDW: 14.3 % (ref 11.5–15.5)
WBC: 8.9 10*3/uL (ref 4.0–10.5)
nRBC: 0 % (ref 0.0–0.2)

## 2020-10-16 LAB — GLUCOSE, CAPILLARY
Glucose-Capillary: 157 mg/dL — ABNORMAL HIGH (ref 70–99)
Glucose-Capillary: 164 mg/dL — ABNORMAL HIGH (ref 70–99)

## 2020-10-16 MED ORDER — ASPIRIN 81 MG PO CHEW: 81.0000 mg | CHEWABLE_TABLET | Freq: Two times a day (BID) | ORAL | 0 refills | Status: DC

## 2020-10-16 MED ORDER — OXYCODONE HCL 5 MG PO TABS: 5.0000 mg | ORAL_TABLET | ORAL | 0 refills | Status: DC | PRN

## 2020-10-16 MED ORDER — NITROGLYCERIN 1 MG/10 ML FOR IR/CATH LAB
INTRA_ARTERIAL | Status: AC
  Filled 2020-10-16: qty 10

## 2020-10-16 NOTE — Telephone Encounter (Signed)
[facility] called stating that they need orders for home health for patient.  Would like orders to be faxed to [phone], attn: Dr. Sherral Hammers.  Please advise.  Thank you.

## 2020-10-16 NOTE — Telephone Encounter (Signed)
Faxed to Dr. Sherral Hammers

## 2020-10-16 NOTE — Progress Notes (Signed)
Physical Therapy Treatment Patient Details Name: Todd Gates MRN: [id number] DOB: July 30, 1945 Today's Date: 10/16/2020    History of Present Illness Pt is 75 yo male s/p L anterior THA on 10/15/20.  PMH signficant for OA, prostate CA, DM, and gout.    PT Comments    Pt progressing towards physical therapy goals. Was able to perform transfers and ambulation with gross min guard assist and RW for support. Reviewed HEP and written program provided. Pt was educated on car transfer, activity progression, and general safety with mobility in the home environment. Will continue to follow.   Follow Up Recommendations  Follow surgeon's recommendation for DC plan and follow-up therapies;Supervision for mobility/OOB     Equipment Recommendations  Rolling walker with 5" wheels    Recommendations for Other Services       Precautions / Restrictions Precautions Precautions: Fall Restrictions Weight Bearing Restrictions: Yes LLE Weight Bearing: Weight bearing as tolerated    Mobility  Bed Mobility Overal bed mobility: Needs Assistance Bed Mobility: Supine to Sit     Supine to sit: Min guard     General bed mobility comments: Increased time and energy to transition to EOB.    Transfers Overall transfer level: Needs assistance Equipment used: Rolling walker (2 wheeled) Transfers: Sit to/from Stand Sit to Stand: Min guard         General transfer comment: VC's for hand placement on seated surface for safety. No assist required however hands-on guarding provided throughout transfers.  Ambulation/Gait Ambulation/Gait assistance: Min guard Gait Distance (Feet): 200 Feet Assistive device: Rolling walker (2 wheeled) Gait Pattern/deviations: Step-through pattern;Decreased stride length;Decreased stance time - right;Step-to pattern;Trunk flexed;Narrow base of support Gait velocity: Decreased Gait velocity interpretation: <1.31 ft/sec, indicative of household ambulator General Gait  Details: VC's for improved posture, cloer walker proximity, forward gaze, increased heel strike and equal step/stride length. Pt able to progress to step-through gait pattern.   Stairs             Wheelchair Mobility    Modified Rankin (Stroke Patients Only)       Balance Overall balance assessment: Needs assistance Sitting-balance support: No upper extremity supported;Feet supported Sitting balance-Leahy Scale: Good     Standing balance support: No upper extremity supported;Bilateral upper extremity supported Standing balance-Leahy Scale: Fair Standing balance comment: static stand without support; RW to ambulate                            Cognition Arousal/Alertness: Awake/alert Behavior During Therapy: WFL for tasks assessed/performed Overall Cognitive Status: Within Functional Limits for tasks assessed                                        Exercises Total Joint Exercises Ankle Circles/Pumps: 10 reps Quad Sets: 10 reps Short Arc Quad: 10 reps Heel Slides: 10 reps Hip ABduction/ADduction: 10 reps Long Arc Quad: 10 reps    General Comments        Pertinent Vitals/Pain Pain Assessment: Faces Faces Pain Scale: Hurts even more Pain Location: L hip Pain Descriptors / Indicators: Sore;Operative site guarding Pain Intervention(s): Limited activity within patient's tolerance;Monitored during session;Repositioned    Home Living                      Prior Function  PT Goals (current goals can now be found in the care plan section) Acute Rehab PT Goals Patient Stated Goal: Home today PT Goal Formulation: With patient Time For Goal Achievement: 10/29/20 Potential to Achieve Goals: Good Progress towards PT goals: Progressing toward goals    Frequency    7X/week      PT Plan Current plan remains appropriate    Co-evaluation              AM-PAC PT "6 Clicks" Mobility   Outcome Measure  Help  needed turning from your back to your side while in a flat bed without using bedrails?: A Little Help needed moving from lying on your back to sitting on the side of a flat bed without using bedrails?: A Little Help needed moving to and from a bed to a chair (including a wheelchair)?: A Little Help needed standing up from a chair using your arms (e.g., wheelchair or bedside chair)?: A Little Help needed to walk in hospital room?: A Little Help needed climbing 3-5 steps with a railing? : A Little 6 Click Score: 18    End of Session Equipment Utilized During Treatment: Gait belt Activity Tolerance: Patient tolerated treatment well Patient left: in bed;with call bell/phone within reach;with bed alarm set;with SCD's reapplied Nurse Communication: Mobility status PT Visit Diagnosis: Other abnormalities of gait and mobility (R26.89);Muscle weakness (generalized) (M62.81)     Time: [id number] PT Time Calculation (min) (ACUTE ONLY): 46 min  Charges:  $Gait Training: 23-37 mins $Therapeutic Exercise: 8-22 mins                     Rolinda Roan, PT, DPT Acute Rehabilitation Services Pager: [phone] Office: [place] 10/16/2020, 11:18 AM

## 2020-10-16 NOTE — Progress Notes (Signed)
Physical Therapy Treatment Patient Details Name: Todd Gates MRN: [id number] DOB: 03/11/1945 Today's Date: 10/16/2020    History of Present Illness Pt is 75 yo male s/p L anterior THA on 10/15/20.  PMH signficant for OA, prostate CA, DM, and gout.    PT Comments    Pt progressing towards physical therapy goals. Was able to perform transfers and ambulation with gross min guard assist and RW for support. Pt anticipates d/c home this afternoon. Stair training initiated and education was reviewed for car transfer, activity progression, and positioning recommendations. Will continue to follow.     Follow Up Recommendations  Follow surgeon's recommendation for DC plan and follow-up therapies;Supervision for mobility/OOB     Equipment Recommendations  Rolling walker with 5" wheels    Recommendations for Other Services       Precautions / Restrictions Precautions Precautions: Fall Restrictions Weight Bearing Restrictions: Yes LLE Weight Bearing: Weight bearing as tolerated    Mobility  Bed Mobility               General bed mobility comments: Pt was received sitting up in recliner    Transfers Overall transfer level: Needs assistance Equipment used: Rolling walker (2 wheeled) Transfers: Sit to/from Stand Sit to Stand: Min guard         General transfer comment: VC's for hand placement on seated surface for safety. No assist required however hands-on guarding provided throughout transfers.  Ambulation/Gait Ambulation/Gait assistance: Min guard Gait Distance (Feet): 200 Feet Assistive device: Rolling walker (2 wheeled) Gait Pattern/deviations: Step-through pattern;Decreased stride length;Decreased stance time - right;Step-to pattern;Trunk flexed;Narrow base of support Gait velocity: Decreased Gait velocity interpretation: 1.31 - 2.62 ft/sec, indicative of limited community ambulator General Gait Details: VC's for improved posture, cloer walker proximity, forward gaze,  increased heel strike and equal step/stride length. Pt able to progress to step-through gait pattern.   Stairs Stairs: Yes Stairs assistance: Min guard Stair Management: Two rails;One rail Left;Step to pattern;Forwards;Sideways Number of Stairs: 10 General stair comments: Forwards to ascend and sideways to descend. VC's for sequencing and general safety.   Wheelchair Mobility    Modified Rankin (Stroke Patients Only)       Balance Overall balance assessment: Needs assistance Sitting-balance support: No upper extremity supported;Feet supported Sitting balance-Leahy Scale: Good     Standing balance support: No upper extremity supported;Bilateral upper extremity supported Standing balance-Leahy Scale: Fair Standing balance comment: static stand without support; RW to ambulate                            Cognition Arousal/Alertness: Awake/alert Behavior During Therapy: WFL for tasks assessed/performed Overall Cognitive Status: Within Functional Limits for tasks assessed                                        Exercises      General Comments        Pertinent Vitals/Pain Pain Assessment: Faces Faces Pain Scale: Hurts whole lot Pain Location: L hip Pain Descriptors / Indicators: Sore;Operative site guarding Pain Intervention(s): Limited activity within patient's tolerance;Monitored during session;Repositioned    Home Living                      Prior Function            PT Goals (current goals can now be found in the  care plan section) Acute Rehab PT Goals Patient Stated Goal: Home today PT Goal Formulation: With patient Time For Goal Achievement: 10/29/20 Potential to Achieve Goals: Good Progress towards PT goals: Progressing toward goals    Frequency    7X/week      PT Plan Current plan remains appropriate    Co-evaluation              AM-PAC PT "6 Clicks" Mobility   Outcome Measure  Help needed turning  from your back to your side while in a flat bed without using bedrails?: A Little Help needed moving from lying on your back to sitting on the side of a flat bed without using bedrails?: A Little Help needed moving to and from a bed to a chair (including a wheelchair)?: A Little Help needed standing up from a chair using your arms (e.g., wheelchair or bedside chair)?: A Little Help needed to walk in hospital room?: A Little Help needed climbing 3-5 steps with a railing? : A Little 6 Click Score: 18    End of Session Equipment Utilized During Treatment: Gait belt Activity Tolerance: Patient tolerated treatment well Patient left: with call bell/phone within reach;in chair Nurse Communication: Mobility status PT Visit Diagnosis: Other abnormalities of gait and mobility (R26.89);Muscle weakness (generalized) (M62.81)     Time: 1345-1410 PT Time Calculation (min) (ACUTE ONLY): 25 min  Charges:  $Gait Training: 23-37 mins                     Rolinda Roan, PT, DPT Acute Rehabilitation Services Pager: [phone] Office: [phone]    Thelma Comp 10/16/2020, 3:22 PM

## 2020-10-16 NOTE — Discharge Summary (Signed)
Patient ID: Todd Gates MRN: [id number] DOB/AGE: [birth year] 75 y.o.  Admit date: 10/15/2020 Discharge date: 10/16/2020  Admission Diagnoses:  Principal Problem:   Unilateral primary osteoarthritis, left hip Active Problems:   Status post left hip replacement   Discharge Diagnoses:  Same  Past Medical History:  Diagnosis Date   Arthritis    Cancer ([place])    prostate; prostatectomy; radiation   Diabetes mellitus without complication ([place])    Fatty liver    Glaucoma    Both eyes   Gout    HTN (hypertension)    Peptic ulcer disease    Sleep apnea     Surgeries: Procedure(s): LEFT TOTAL HIP ARTHROPLASTY ANTERIOR APPROACH on 10/15/2020   Consultants:   Discharged Condition: Improved  Hospital Course: Arthar Sicher is an 75 y.o. male who was admitted 10/15/2020 for operative treatment ofUnilateral primary osteoarthritis, left hip. Patient has severe unremitting pain that affects sleep, daily activities, and work/hobbies. After pre-op clearance the patient was taken to the operating room on 10/15/2020 and underwent  Procedure(s): LEFT TOTAL HIP ARTHROPLASTY ANTERIOR APPROACH.    Patient was given perioperative antibiotics:  Anti-infectives (From admission, onward)    Start     Dose/Rate Route Frequency Ordered Stop   10/15/20 1800  ceFAZolin (ANCEF) IVPB 1 g/50 mL premix        1 g 100 mL/hr over 30 Minutes Intravenous Every 6 hours 10/15/20 1628 10/16/20 0030   10/15/20 0945  ceFAZolin (ANCEF) IVPB 2g/100 mL premix        2 g 200 mL/hr over 30 Minutes Intravenous On call to O.R. 10/15/20 0931 10/15/20 1230        Patient was given sequential compression devices, early ambulation, and chemoprophylaxis to prevent DVT.  Patient benefited maximally from hospital stay and there were no complications.    Recent vital signs: Patient Vitals for the past 24 hrs:  BP Temp Temp src Pulse Resp SpO2  10/16/20 0733 (!) 111/57 97.8 F (36.6 C) Oral 62 18 96 %  10/16/20 0550 111/68  98.1 F (36.7 C) Oral 68 18 98 %  10/15/20 2320 (!) 102/51 98.4 F (36.9 C) Oral 70 18 99 %  10/15/20 2000 (!) 129/51 97.6 F (36.4 C) Oral 62 18 96 %  10/15/20 1617 (!) 138/55 97.7 F (36.5 C) Oral (!) 52 20 100 %  10/15/20 1558 125/61 98.3 F (36.8 C) -- (!) 48 14 99 %  10/15/20 1543 125/70 -- -- (!) 49 14 100 %  10/15/20 1513 (!) 119/56 -- -- (!) 48 13 99 %  10/15/20 1458 (!) 124/52 98.2 F (36.8 C) -- (!) 44 (!) 9 98 %  10/15/20 1443 (!) 127/52 -- -- (!) 44 13 99 %  10/15/20 1428 (!) 115/56 -- -- (!) 44 14 99 %  10/15/20 1413 (!) 100/58 -- -- (!) 50 17 100 %  10/15/20 1358 (!) 94/51 (!) 97 F (36.1 C) -- (!) 49 18 100 %     Recent laboratory studies:  Recent Labs    10/16/20 0411  WBC 8.9  HGB 9.6*  HCT 29.1*  PLT 240  NA 136  K 4.1  CL 105  CO2 24  BUN 29*  CREATININE 1.68*  GLUCOSE 172*  CALCIUM 9.0     Discharge Medications:   Allergies as of 10/16/2020   No Known Allergies      Medication List     TAKE these medications    acetaminophen 500 MG tablet Commonly known as:  TYLENOL Take 500 mg by mouth every 6 (six) hours as needed for mild pain.   aspirin 81 MG chewable tablet Chew 1 tablet (81 mg total) by mouth 2 (two) times daily.   atorvastatin 10 MG tablet Commonly known as: LIPITOR Take 10 mg by mouth daily.   CALCIUM 600 PO Take 600 mg by mouth daily.   Chewable Vite Childrens Chew Chew 1 Piece by mouth daily.   chlorthalidone 25 MG tablet Commonly known as: HYGROTON Take 25 mg by mouth daily.   febuxostat 40 MG tablet Commonly known as: ULORIC Take 40 mg by mouth daily as needed (gout flares).   lisinopril 40 MG tablet Commonly known as: ZESTRIL Take 40 mg by mouth daily.   meloxicam 15 MG tablet Commonly known as: MOBIC Take 15 mg by mouth daily as needed for pain.   metFORMIN 750 MG 24 hr tablet Commonly known as: GLUCOPHAGE-XR Take 750 mg by mouth daily.   oxyCODONE 5 MG immediate release tablet Commonly known as:  Oxy IR/ROXICODONE Take 1-2 tablets (5-10 mg total) by mouth every 4 (four) hours as needed for moderate pain (pain score 4-6).   REFRESH CELLUVISC OP Place 1 drop into both eyes at bedtime.   SIMBRINZA OP Place 1 drop into both eyes in the morning, at noon, and at bedtime.               Durable Medical Equipment  (From admission, onward)           Start     Ordered   10/15/20 1629  DME 3 n 1  Once        10/15/20 1628   10/15/20 1629  DME Walker rolling  Once       Question Answer Comment  Walker: With 5 Inch Wheels   Patient needs a walker to treat with the following condition Status post total replacement of left hip      10/15/20 1628            Diagnostic Studies: DG Pelvis Portable  Result Date: 10/15/2020 CLINICAL DATA:  Left hip total arthroplasty EXAM: PORTABLE PELVIS 1-2 VIEWS; OPERATIVE LEFT HIP WITH PELVIS COMPARISON:  03/08/2020 FLUOROSCOPY TIME:  18 seconds FINDINGS: Intraoperative fluoroscopic images of the left hip demonstrate total arthroplasty. Postoperative AP portable radiograph demonstrates total hip arthroplasty without evidence of perihardware fracture or component malpositioning. Expected overlying postoperative change. IMPRESSION: Status post left hip total arthroplasty. No evidence of perihardware fracture or component malpositioning. Electronically Signed   By: Eddie Candle M.D.   On: 10/15/2020 14:24   DG C-Arm 1-60 Min-No Report  Result Date: 10/15/2020 Fluoroscopy was utilized by the requesting physician.  No radiographic interpretation.   DG HIP OPERATIVE UNILAT WITH PELVIS LEFT  Result Date: 10/15/2020 CLINICAL DATA:  Left hip total arthroplasty EXAM: PORTABLE PELVIS 1-2 VIEWS; OPERATIVE LEFT HIP WITH PELVIS COMPARISON:  03/08/2020 FLUOROSCOPY TIME:  18 seconds FINDINGS: Intraoperative fluoroscopic images of the left hip demonstrate total arthroplasty. Postoperative AP portable radiograph demonstrates total hip arthroplasty without  evidence of perihardware fracture or component malpositioning. Expected overlying postoperative change. IMPRESSION: Status post left hip total arthroplasty. No evidence of perihardware fracture or component malpositioning. Electronically Signed   By: Eddie Candle M.D.   On: 10/15/2020 14:24    Disposition: Discharge disposition: 01-Home or [facility]     Mcarthur Rossetti, MD Follow up in 2 week(s).   Specialty: Orthopedic  Surgery Contact information: [place] [address] [phone]         [facility], [place] Follow up.   Specialty: [place] Why: [facility] will contact you to schedule your first home visit. Contact information: [address] [place]  [address] [phone]                  Signed: Mcarthur Rossetti 10/16/2020, 11:49 AM

## 2020-10-16 NOTE — Progress Notes (Signed)
Patient alert and oriented, voiding adequately, skin clean, dry and intact without evidence of skin break down, or symptoms of complications - no redness or edema noted, only slight tenderness at site.  Patient states pain is manageable at time of discharge. Patient has an appointment with MD in 2 weeks 

## 2020-10-16 NOTE — TOC Initial Note (Addendum)
Transition of Care [facility]) - Initial/Assessment Note    Patient Details  Name: Todd Gates MRN: [id number] Date of Birth: Mar 16, 1945  Transition of Care [facility]) CM/SW Contact:    Todd Chars, LCSW Phone Number: 10/16/2020, 10:33 AM  Clinical Narrative:    CSW met with pt regarding recommendation for [name].  Pt agreeable, choice document given, no agency preference by pt.  Pt reports that in addition to [place] care, he is also covered by [employer] (not on face sheet) but pt would like [employer] set up through [place].  Pt lives at home with wife who has dementia, pt sister currently staying with them to assist.  Dr Todd Gates at [facility] is PCP.  Pt is vaccinated for covid with both boosters.    CSW spoke with Todd Gates at [place] and she can work with pt through [place] and through [employer].  She will reach out to [place] regarding this pt.  CSW called [place], CSW was transferred to [place] but was disconnected several times.  CSW LM with CSW Todd Gates requesting call back or assistance in getting through to [place].        1045: TC from [place].  She requested HH order be faxed to Dr Todd Gates at [phone], which was done.  [facility] is fine as provider.              Expected Discharge Plan: [place] Barriers to Discharge: No Barriers Identified   Patient Goals and CMS Choice Patient states their goals for this hospitalization and ongoing recovery are:: walking better, less soreness CMS Medicare.gov Compare Post Acute Care list provided to:: Patient Choice offered to / list presented to : Patient  Expected Discharge Plan and Services Expected Discharge Plan: [place] In-house Referral: Clinical Social Work   Post Acute Care Choice: [place] arrangements for the past 2 months: [place]                 DME Arranged: N/A (DME through [facility] staff)         [place] Arranged: PT [place]: [place] Date [place]: 10/16/20 Time HH Agency Contacted: 1000 Representative spoke with at [place]: [place]  Prior Living Arrangements/Services Living arrangements for the past 2 months: [place] with:: Spouse, Relatives (pt sister) Patient language and need for interpreter reviewed:: Yes Do you feel safe going back to the place where you live?: Yes      Need for Family Participation in Patient Care: Yes (Comment) Care giver support system in place?: Yes (comment) Current home services: Other (comment) (na) Criminal Activity/Legal Involvement Pertinent to Current Situation/Hospitalization: No - Comment as needed  Activities of Daily Living      Permission Sought/Granted                  Emotional Assessment Appearance:: Appears stated age Attitude/Demeanor/Rapport: Engaged Affect (typically observed): Appropriate, Pleasant Orientation: : Oriented to Self, Oriented to Place, Oriented to  Time, Oriented to Situation Alcohol / Substance Use: Not Applicable Psych Involvement: No (comment)  Admission diagnosis:  Status post left hip replacement [P91.505] Patient Active Problem List   Diagnosis Date Noted   Unilateral primary osteoarthritis, left hip 10/15/2020   Status post left hip replacement 10/15/2020   Achilles tendon contracture, left 02/21/2016   Plantar fasciitis 01/23/2016   Primary osteoarthritis of left knee 07/13/2013   PCP:  Bonnita Hollow,  MD Pharmacy:   [place], [place] [address] [place] [address] Phone: [phone] Fax: [phone]     Social Determinants of Health (SDOH) Interventions    Readmission Risk Interventions No flowsheet data found.

## 2020-10-16 NOTE — Progress Notes (Signed)
Patient had an uneventful night. He ambulated twice throughout the shift.

## 2020-10-16 NOTE — Progress Notes (Signed)
Subjective: 1 Day Post-Op Procedure(s) (LRB): LEFT TOTAL HIP ARTHROPLASTY ANTERIOR APPROACH (Left) Patient reports pain as moderate.  Acute blood loss anemia on chronic anemia and chronic renal insuff.  Objective: Vital signs in last 24 hours: Temp:  [97 F (36.1 C)-98.4 F (36.9 C)] 97.8 F (36.6 C) (08/24 0733) Pulse Rate:  [44-70] 62 (08/24 0733) Resp:  [9-20] 18 (08/24 0733) BP: (94-146)/(51-70) 111/57 (08/24 0733) SpO2:  [96 %-100 %] 96 % (08/24 0733) Weight:  [93.4 kg] 93.4 kg (08/23 0944)  Intake/Output from previous day: 08/23 0701 - 08/24 0700 In: 1680 [P.O.:480; I.V.:1000; IV Piggyback:200] Out: 1550 [Urine:1350; Blood:200] Intake/Output this shift: No intake/output data recorded.  Recent Labs    10/16/20 0411  HGB 9.6*   Recent Labs    10/16/20 0411  WBC 8.9  RBC 3.15*  HCT 29.1*  PLT 240   Recent Labs    10/16/20 0411  NA 136  K 4.1  CL 105  CO2 24  BUN 29*  CREATININE 1.68*  GLUCOSE 172*  CALCIUM 9.0   No results for input(s): LABPT, INR in the last 72 hours.  Sensation intact distally Intact pulses distally Dorsiflexion/Plantar flexion intact Incision: dressing C/D/I   Assessment/Plan: 1 Day Post-Op Procedure(s) (LRB): LEFT TOTAL HIP ARTHROPLASTY ANTERIOR APPROACH (Left) Up with therapy Discharge home with home health later this afternoon if clears therapy. Will check on him again at noon.      Mcarthur Rossetti 10/16/2020, 7:42 AM

## 2020-10-17 ENCOUNTER — Encounter (HOSPITAL_COMMUNITY): Payer: Self-pay | Admitting: Orthopaedic Surgery

## 2020-10-29 ENCOUNTER — Ambulatory Visit (INDEPENDENT_AMBULATORY_CARE_PROVIDER_SITE_OTHER): Payer: No Typology Code available for payment source | Admitting: Orthopaedic Surgery

## 2020-10-29 ENCOUNTER — Encounter: Payer: Self-pay | Admitting: Orthopaedic Surgery

## 2020-10-29 DIAGNOSIS — Z96642 Presence of left artificial hip joint: Secondary | ICD-10-CM

## 2020-10-29 NOTE — Progress Notes (Signed)
HPI: [name] returns today 2 weeks status post left total hip arthroplasty.  Patient overall doing well.  He states therapy only came out of work with him once.  He is taking aspirin twice daily for DVT prophylaxis.  Only taking Tylenol for pain.  States he walks often will use the walker at times mostly just carrying it.  He has had no shortness of breath fevers chills.  Physical exam: Left hip surgical incisions well approximated staples no signs of infection.  Left calf supple nontender.  Dorsiflexion plantarflexion left ankle intact.  Good range of motion left hip with minimal discomfort.  Impression: Status post left total hip arthroplasty 10/15/2020  Plan: He will work on scar tissue lesion.  Sutures removed Steri-Strips applied.  He understands not to submerge the wound but can get it wet in the shower.  Follow-up with [place] in 1 month sooner if there is any questions concerns.  He will remain on 81 mg aspirin once daily for another week and then discontinue as he is on no aspirin prior to surgery.  Questions were encouraged and answered at length.

## 2020-11-11 DIAGNOSIS — M2010 Hallux valgus (acquired), unspecified foot: Secondary | ICD-10-CM | POA: Diagnosis not present

## 2020-11-11 DIAGNOSIS — B351 Tinea unguium: Secondary | ICD-10-CM | POA: Diagnosis not present

## 2020-11-21 DIAGNOSIS — D649 Anemia, unspecified: Secondary | ICD-10-CM | POA: Diagnosis not present

## 2020-11-21 DIAGNOSIS — E785 Hyperlipidemia, unspecified: Secondary | ICD-10-CM | POA: Diagnosis not present

## 2020-11-21 DIAGNOSIS — Z23 Encounter for immunization: Secondary | ICD-10-CM | POA: Diagnosis not present

## 2020-11-21 DIAGNOSIS — E1169 Type 2 diabetes mellitus with other specified complication: Secondary | ICD-10-CM | POA: Diagnosis not present

## 2020-11-21 DIAGNOSIS — I10 Essential (primary) hypertension: Secondary | ICD-10-CM | POA: Diagnosis not present

## 2020-11-27 ENCOUNTER — Encounter: Payer: No Typology Code available for payment source | Admitting: Orthopaedic Surgery

## 2020-11-28 ENCOUNTER — Other Ambulatory Visit: Payer: Self-pay

## 2020-11-28 ENCOUNTER — Ambulatory Visit (INDEPENDENT_AMBULATORY_CARE_PROVIDER_SITE_OTHER): Payer: No Typology Code available for payment source | Admitting: Physician Assistant

## 2020-11-28 ENCOUNTER — Encounter: Payer: Self-pay | Admitting: Physician Assistant

## 2020-11-28 DIAGNOSIS — Z96642 Presence of left artificial hip joint: Secondary | ICD-10-CM

## 2020-11-28 MED ORDER — AMOXICILLIN 500 MG PO TABS: ORAL_TABLET | ORAL | 0 refills | Status: DC

## 2020-11-28 NOTE — Progress Notes (Signed)
HPI: Todd Gates returns today status post left total hip arthroplasty 10/15/2020.  He states overall he is doing well.  States the incision is little sore.  No numbness tingling about the incision is dissipating.  He does request a note for his dentist as far as antibiotics and joint replacement.  Otherwise his left hip he has no complaints.  Physical exam: Left hip excellent range of motion without pain.  Left calf supple nontender.  Dorsiflexion plantarflexion left ankle intact.  Ambulates with a nonantalgic gait without any assistive device.  Surgical incision is benign no signs of infection or wound dehiscence.  Impression: Status post left total hip arthroplasty 10/15/2020  Plan: Recommend antibiotics prior to dental procedures for the first 3 months after total joint replacement after that point time no need for antibiotics.  Therefore amoxicillin was sent in for him to have prior to his upcoming cleaning state.  Note was given stating that he does not need lifelong antibiotics after total joint replacement prior to dental procedures.  We will see him back at 6 months postop and obtain an AP pelvis and lateral view of the left hip.  Questions were encouraged and answered at length.  Follow-up sooner if there is any questions or concerns.

## 2020-12-11 DIAGNOSIS — Z23 Encounter for immunization: Secondary | ICD-10-CM | POA: Diagnosis not present

## 2020-12-16 DIAGNOSIS — M069 Rheumatoid arthritis, unspecified: Secondary | ICD-10-CM | POA: Diagnosis not present

## 2020-12-16 DIAGNOSIS — M1000 Idiopathic gout, unspecified site: Secondary | ICD-10-CM | POA: Diagnosis not present

## 2020-12-16 DIAGNOSIS — N189 Chronic kidney disease, unspecified: Secondary | ICD-10-CM | POA: Diagnosis not present

## 2020-12-16 DIAGNOSIS — N1832 Chronic kidney disease, stage 3b: Secondary | ICD-10-CM | POA: Diagnosis not present

## 2020-12-16 DIAGNOSIS — I129 Hypertensive chronic kidney disease with stage 1 through stage 4 chronic kidney disease, or unspecified chronic kidney disease: Secondary | ICD-10-CM | POA: Diagnosis not present

## 2020-12-16 DIAGNOSIS — E1122 Type 2 diabetes mellitus with diabetic chronic kidney disease: Secondary | ICD-10-CM | POA: Diagnosis not present

## 2020-12-16 DIAGNOSIS — D631 Anemia in chronic kidney disease: Secondary | ICD-10-CM | POA: Diagnosis not present

## 2020-12-17 ENCOUNTER — Telehealth: Payer: Self-pay | Admitting: Orthopaedic Surgery

## 2020-12-17 NOTE — Telephone Encounter (Signed)
Letter completed and faxed.

## 2020-12-17 NOTE — Telephone Encounter (Signed)
[facility] called requesting a letter stating pt don't need pre meds after 3 months of hip replacement. Please fax letter to [phone].

## 2020-12-18 ENCOUNTER — Other Ambulatory Visit (HOSPITAL_COMMUNITY): Payer: Self-pay | Admitting: Nephrology

## 2020-12-18 ENCOUNTER — Other Ambulatory Visit: Payer: Self-pay | Admitting: Nephrology

## 2020-12-18 DIAGNOSIS — N1832 Chronic kidney disease, stage 3b: Secondary | ICD-10-CM

## 2020-12-18 DIAGNOSIS — I129 Hypertensive chronic kidney disease with stage 1 through stage 4 chronic kidney disease, or unspecified chronic kidney disease: Secondary | ICD-10-CM

## 2020-12-24 ENCOUNTER — Other Ambulatory Visit: Payer: Self-pay

## 2020-12-24 ENCOUNTER — Ambulatory Visit (HOSPITAL_COMMUNITY)
Admission: RE | Admit: 2020-12-24 | Discharge: 2020-12-24 | Disposition: A | Discharge status: Home / Self Care | Payer: Medicare HMO | Source: Ambulatory Visit | Attending: Nephrology | Admitting: Nephrology

## 2020-12-24 DIAGNOSIS — N1832 Chronic kidney disease, stage 3b: Secondary | ICD-10-CM | POA: Diagnosis not present

## 2020-12-24 DIAGNOSIS — I129 Hypertensive chronic kidney disease with stage 1 through stage 4 chronic kidney disease, or unspecified chronic kidney disease: Secondary | ICD-10-CM | POA: Insufficient documentation

## 2020-12-24 DIAGNOSIS — N281 Cyst of kidney, acquired: Secondary | ICD-10-CM | POA: Diagnosis not present

## 2020-12-24 DIAGNOSIS — N183 Chronic kidney disease, stage 3 unspecified: Secondary | ICD-10-CM | POA: Diagnosis not present

## 2021-01-15 DIAGNOSIS — J069 Acute upper respiratory infection, unspecified: Secondary | ICD-10-CM | POA: Diagnosis not present

## 2021-01-20 DIAGNOSIS — B351 Tinea unguium: Secondary | ICD-10-CM | POA: Diagnosis not present

## 2021-01-20 DIAGNOSIS — M2010 Hallux valgus (acquired), unspecified foot: Secondary | ICD-10-CM | POA: Diagnosis not present

## 2021-01-23 ENCOUNTER — Encounter (HOSPITAL_COMMUNITY): Payer: Self-pay | Admitting: Radiology

## 2021-02-10 DIAGNOSIS — H409 Unspecified glaucoma: Secondary | ICD-10-CM | POA: Diagnosis not present

## 2021-02-10 DIAGNOSIS — N2889 Other specified disorders of kidney and ureter: Secondary | ICD-10-CM | POA: Diagnosis not present

## 2021-02-10 DIAGNOSIS — Z6831 Body mass index (BMI) 31.0-31.9, adult: Secondary | ICD-10-CM | POA: Diagnosis not present

## 2021-02-18 DIAGNOSIS — N281 Cyst of kidney, acquired: Secondary | ICD-10-CM | POA: Diagnosis not present

## 2021-02-18 DIAGNOSIS — N2889 Other specified disorders of kidney and ureter: Secondary | ICD-10-CM | POA: Diagnosis not present

## 2021-02-18 DIAGNOSIS — K820 Obstruction of gallbladder: Secondary | ICD-10-CM | POA: Diagnosis not present

## 2021-02-18 DIAGNOSIS — K5730 Diverticulosis of large intestine without perforation or abscess without bleeding: Secondary | ICD-10-CM | POA: Diagnosis not present

## 2021-03-03 ENCOUNTER — Inpatient Hospital Stay (HOSPITAL_COMMUNITY): Payer: Medicare HMO | Attending: Hematology | Admitting: Hematology

## 2021-03-03 ENCOUNTER — Inpatient Hospital Stay (HOSPITAL_COMMUNITY): Payer: Medicare HMO

## 2021-03-03 ENCOUNTER — Encounter (HOSPITAL_COMMUNITY): Payer: Self-pay | Admitting: Hematology

## 2021-03-03 ENCOUNTER — Other Ambulatory Visit: Payer: Self-pay

## 2021-03-03 DIAGNOSIS — N2889 Other specified disorders of kidney and ureter: Secondary | ICD-10-CM | POA: Insufficient documentation

## 2021-03-03 DIAGNOSIS — Z79899 Other long term (current) drug therapy: Secondary | ICD-10-CM | POA: Diagnosis not present

## 2021-03-03 DIAGNOSIS — C61 Malignant neoplasm of prostate: Secondary | ICD-10-CM | POA: Insufficient documentation

## 2021-03-03 DIAGNOSIS — Z803 Family history of malignant neoplasm of breast: Secondary | ICD-10-CM | POA: Insufficient documentation

## 2021-03-03 DIAGNOSIS — Z923 Personal history of irradiation: Secondary | ICD-10-CM | POA: Diagnosis not present

## 2021-03-03 DIAGNOSIS — Z800 Family history of malignant neoplasm of digestive organs: Secondary | ICD-10-CM | POA: Insufficient documentation

## 2021-03-03 DIAGNOSIS — D649 Anemia, unspecified: Secondary | ICD-10-CM | POA: Insufficient documentation

## 2021-03-03 LAB — CBC WITH DIFFERENTIAL/PLATELET
Abs Immature Granulocytes: 0.03 10*3/uL (ref 0.00–0.07)
Basophils Absolute: 0.1 10*3/uL (ref 0.0–0.1)
Basophils Relative: 1 %
Eosinophils Absolute: 0.4 10*3/uL (ref 0.0–0.5)
Eosinophils Relative: 4 %
HCT: 34.3 % — ABNORMAL LOW (ref 39.0–52.0)
Hemoglobin: 11.2 g/dL — ABNORMAL LOW (ref 13.0–17.0)
Immature Granulocytes: 0 %
Lymphocytes Relative: 29 %
Lymphs Abs: 2.8 10*3/uL (ref 0.7–4.0)
MCH: 30.4 pg (ref 26.0–34.0)
MCHC: 32.7 g/dL (ref 30.0–36.0)
MCV: 93.2 fL (ref 80.0–100.0)
Monocytes Absolute: 0.8 10*3/uL (ref 0.1–1.0)
Monocytes Relative: 8 %
Neutro Abs: 5.8 10*3/uL (ref 1.7–7.7)
Neutrophils Relative %: 58 %
Platelets: 296 10*3/uL (ref 150–400)
RBC: 3.68 MIL/uL — ABNORMAL LOW (ref 4.22–5.81)
RDW: 14.4 % (ref 11.5–15.5)
WBC: 9.8 10*3/uL (ref 4.0–10.5)
nRBC: 0 % (ref 0.0–0.2)

## 2021-03-03 LAB — RETICULOCYTES
Immature Retic Fract: 12.3 % (ref 2.3–15.9)
RBC.: 3.72 MIL/uL — ABNORMAL LOW (ref 4.22–5.81)
Retic Count, Absolute: 72.2 10*3/uL (ref 19.0–186.0)
Retic Ct Pct: 1.9 % (ref 0.4–3.1)

## 2021-03-03 LAB — IRON AND TIBC
Iron: 59 ug/dL (ref 45–182)
Saturation Ratios: 14 % — ABNORMAL LOW (ref 17.9–39.5)
TIBC: 407 ug/dL (ref 250–450)
UIBC: 348 ug/dL

## 2021-03-03 LAB — COMPREHENSIVE METABOLIC PANEL
ALT: 18 U/L (ref 0–44)
AST: 23 U/L (ref 15–41)
Albumin: 4.7 g/dL (ref 3.5–5.0)
Alkaline Phosphatase: 93 U/L (ref 38–126)
Anion gap: 11 (ref 5–15)
BUN: 31 mg/dL — ABNORMAL HIGH (ref 8–23)
CO2: 21 mmol/L — ABNORMAL LOW (ref 22–32)
Calcium: 9.8 mg/dL (ref 8.9–10.3)
Chloride: 107 mmol/L (ref 98–111)
Creatinine, Ser: 1.55 mg/dL — ABNORMAL HIGH (ref 0.61–1.24)
GFR, Estimated: 46 mL/min — ABNORMAL LOW (ref >60)
Glucose, Bld: 113 mg/dL — ABNORMAL HIGH (ref 70–99)
Potassium: 3.7 mmol/L (ref 3.5–5.1)
Sodium: 139 mmol/L (ref 135–145)
Total Bilirubin: 0.7 mg/dL (ref 0.3–1.2)
Total Protein: 8.3 g/dL — ABNORMAL HIGH (ref 6.5–8.1)

## 2021-03-03 LAB — LACTATE DEHYDROGENASE: LDH: 139 U/L (ref 98–192)

## 2021-03-03 LAB — FERRITIN: Ferritin: 126 ng/mL (ref 24–336)

## 2021-03-03 LAB — VITAMIN B12: Vitamin B-12: 376 pg/mL (ref 180–914)

## 2021-03-03 LAB — FOLATE: Folate: 23.7 ng/mL (ref >5.9)

## 2021-03-03 NOTE — Patient Instructions (Addendum)
[place] at [facility] Discharge Instructions  You were seen and examined today by Dr. Delton Coombes. Dr. Delton Coombes is a medical oncologist, meaning that he specializes in the management of cancer diagnoses. Dr. Delton Coombes discussed your past medical history, family history of cancers, and the events that led to you being here today.  Your recent renal ultrasound revealed a lesion on the right kidney. This is concerning for cancer.  Dr. Delton Coombes has recommended genetic testing due to your history of prostate cancer and family history of cancers. This can be delayed until you are ready to do so. Please let us know when you are ready to proceed.  Dr. Delton Coombes also recommends a CT scan of your chest and an MRI of your abdomen and pelvis. This is to get a clearer image of the lesion and see if there is anywhere else in your body impacted.  You also have a slight degree of anemia. Dr. Delton Coombes has recommended additional lab work today.  Follow-up as scheduled.   Thank you for choosing [place] at [facility] to provide your oncology and hematology care.  To afford each patient quality time with our provider, please arrive at least 15 minutes before your scheduled appointment time.   If you have a lab appointment with the [place] please come in thru the Main Entrance and check in at the main information desk.  You need to re-schedule your appointment should you arrive 10 or more minutes late.  We strive to give you quality time with our providers, and arriving late affects you and other patients whose appointments are after yours.  Also, if you no show three or more times for appointments you may be dismissed from the clinic at the providers discretion.     Again, thank you for choosing [facility].  Our hope is that these requests will decrease the amount of time that you wait before being seen by our physicians.        _____________________________________________________________  Should you have questions after your visit to [facility], please contact our office at [phone] and follow the prompts.  Our office hours are 8:00 a.m. and 4:30 p.m. Monday - Friday.  Please note that voicemails left after 4:00 p.m. may not be returned until the following business day.  We are closed weekends and major holidays.  You do have access to a nurse 24-7, just call the main number to the clinic [phone] and do not press any options, hold on the line and a nurse will answer the phone.    For prescription refill requests, have your pharmacy contact our office and allow 72 hours.    Due to Covid, you will need to wear a mask upon entering the hospital. If you do not have a mask, a mask will be given to you at the Main Entrance upon arrival. For doctor visits, patients may have 1 support person age 45 or older with them. For treatment visits, patients can not have anyone with them due to social distancing guidelines and our immunocompromised population.

## 2021-03-03 NOTE — Progress Notes (Signed)
[facility] NOTE  Patient Care Team: Bonnita Hollow, MD as PCP - General (Family Medicine) Derek Jack, MD as Medical Oncologist (Medical Oncology)  CHIEF COMPLAINTS/PURPOSE OF CONSULTATION:  Evaluation of right kidney mass  HISTORY OF PRESENTING ILLNESS:  Todd Gates 76 y.o. male is here because of evaluation of right kidney mass.  Today he reports feeling good, and he is accompanied by his wife. He denies hematuria, unexpected weight loss, and fevers. He has a history of prostate cancer which was treated with surgery, radiation 8 years after diagnosis due to elevated PSA, currently treated with Lupron which was started 3 years ago and causes occasional night sweats and hot flashes.   He denies history of alcohol or tobacco use. 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 He reports exposure to Agent Orange in [place]. His sister had breast cancer, his mother had breast cancer, and another sister had stomach cancer.  MEDICAL HISTORY:  Past Medical History:  Diagnosis Date   Arthritis    Cancer [facility])    prostate; prostatectomy; radiation   Diabetes mellitus without complication ([place])    Fatty liver    Glaucoma    Both eyes   Gout    HTN (hypertension)    Peptic ulcer disease    Sleep apnea     SURGICAL HISTORY: Past Surgical History:  Procedure Laterality Date   COLONOSCOPY W/ POLYPECTOMY  02/2020   PROSTATE SURGERY     PROSTATECTOMY  2008   TONSILLECTOMY     TOTAL HIP ARTHROPLASTY Left 10/15/2020   Procedure: LEFT TOTAL HIP ARTHROPLASTY ANTERIOR APPROACH;  Surgeon: Mcarthur Rossetti, MD;  Location: [place];  Service: Orthopedics;  Laterality: Left;    SOCIAL HISTORY: Social History   Socioeconomic History   Marital status: Married    Spouse name: Not on file   Number of children: Not on file   Years of education: Not on file   Highest education level: Not on file  Occupational History   Not on file  Tobacco  Use   Smoking status: Unknown   Smokeless tobacco: Never  Vaping Use   Vaping Use: Not on file  Substance and Sexual Activity   Alcohol use: No   Drug use: No   Sexual activity: Not on file  Other Topics Concern   Not on file  Social History Narrative   Not on file   Social Determinants of Health   Financial Resource Strain: Not on file  Food Insecurity: Not on file  Transportation Needs: Not on file  Physical Activity: Not on file  Stress: Not on file  Social Connections: Not on file  Intimate Partner Violence: Not on file    FAMILY HISTORY: Family History  Problem Relation Age of Onset   Hypertension Unknown     ALLERGIES:  has No Known Allergies.  MEDICATIONS:  Current Outpatient Medications  Medication Sig Dispense Refill   acetaminophen (TYLENOL) 500 MG tablet Take 500 mg by mouth every 6 (six) hours as needed for mild pain. (Patient not taking: Reported on 03/03/2021)     amoxicillin (AMOXIL) 500 MG tablet 2 po 1 hour prior to procedure  and 2 po 6 hours after procedure 4 tablet 0   atorvastatin (LIPITOR) 10 MG tablet Take 10 mg by mouth daily.     Brinzolamide-Brimonidine (SIMBRINZA OP) Place 1 drop into both eyes in the morning, at noon, and at bedtime.     Calcium Carbonate (CALCIUM 600 PO)  Take 600 mg by mouth daily.     Carboxymethylcellulose Sodium (REFRESH CELLUVISC OP) Place 1 drop into both eyes at bedtime.     chlorthalidone (HYGROTON) 25 MG tablet Take 25 mg by mouth daily.     febuxostat (ULORIC) 40 MG tablet Take 40 mg by mouth daily as needed (gout flares).     lisinopril (ZESTRIL) 40 MG tablet Take 40 mg by mouth daily.     meloxicam (MOBIC) 15 MG tablet Take 15 mg by mouth daily as needed for pain.     metFORMIN (GLUCOPHAGE-XR) 750 MG 24 hr tablet Take 750 mg by mouth daily.     oxyCODONE (OXY IR/ROXICODONE) 5 MG immediate release tablet Take 1-2 tablets (5-10 mg total) by mouth every 4 (four) hours as needed for moderate pain (pain score 4-6).  (Patient not taking: Reported on 03/03/2021) 30 tablet 0   Pediatric Multiple Vit-C-FA (CHEWABLE VITE CHILDRENS) CHEW Chew 1 Piece by mouth daily.     No current facility-administered medications for this visit.    REVIEW OF SYSTEMS:   Constitutional: Negative for for appetite change, fatigue, fever, and unexpected weight loss. Endocrine: Positive for night sweats and hot flashes. GU: Negative for hematuria. Neurological: Negative for tingling/numbness. All other systems were reviewed with the patient and are negative.  PHYSICAL EXAMINATION: ECOG PERFORMANCE STATUS: 1 - Symptomatic but completely ambulatory  There were no vitals filed for this visit. There were no vitals filed for this visit.  GENERAL:alert, no distress and comfortable LUNGS: clear to auscultation and percussion with normal breathing effort HEART: regular rate & rhythm and no murmurs and no lower extremity edema ABDOMEN:abdomen soft, non-tender and normal bowel sounds Musculoskeletal:no edema bilaterally PSYCH: alert & oriented x 3 with fluent speech  LABORATORY DATA:  I have reviewed the data as listed Lab Results  Component Value Date   WBC 9.8 03/03/2021   HGB 11.2 (L) 03/03/2021   HCT 34.3 (L) 03/03/2021   MCV 93.2 03/03/2021   PLT 296 03/03/2021     Chemistry      Component Value Date/Time   NA 139 03/03/2021 1425   K 3.7 03/03/2021 1425   CL 107 03/03/2021 1425   CO2 21 (L) 03/03/2021 1425   BUN 31 (H) 03/03/2021 1425   CREATININE 1.55 (H) 03/03/2021 1425      Component Value Date/Time   CALCIUM 9.8 03/03/2021 1425   ALKPHOS 93 03/03/2021 1425   AST 23 03/03/2021 1425   ALT 18 03/03/2021 1425   BILITOT 0.7 03/03/2021 1425       RADIOGRAPHIC STUDIES: I have personally reviewed the radiological images as listed and agreed with the findings in the report. No results found.  ASSESSMENT: Right kidney mass: - Renal ultrasound ordered by Dr. Marval Regal on 12/24/2020 showed 2.4 x 2.1 x 2.3 cm  lower pole solid-appearing lesion. - MRI abdomen with and without contrast on 02/19/2021 at [facility] showed complex partially cystic and partially heterogeneous enhancing mass identified in the lower pole of the right kidney measuring 2.4 x 2.4 cm, given limitations of motion. - Denies any B symptoms.  Denies any hematuria.   Social/family history: - Lives at home with his wife. 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  Also exposed to agent orange in [place]. - Non-smoker nondrinker. - Sister had breast cancer.  Another sister had stomach cancer.  Mother had breast cancer.  3.  Prostate cancer: - Status post robotic prostatectomy.  8 years later PSA started going up.  He underwent XRT and was started on Lupron every 6 months.  He thinks he has been on it for the last 3 years.  Last PSA was reportedly 0.10.  He follows up at [facility] in [place].   PLAN:  Right kidney mass: - We have reviewed MRI findings concerning for neoplasm.  Limited evaluation due to motion. - Recommend multiphasic CT scan, renal mass protocol. - Recommend CT chest without contrast to complete work-up. - Treatment options for stage I (T1a) were discussed including partial nephrectomy/ablation/active surveillance. - We will make urology referral after the scans. - I have strongly recommended genetic testing based on family history and personal history.  He wants to defer it at this time. - RTC after scans.  2.  Normocytic anemia: - Last CBC showed hemoglobin 9.6 with MCV 92.  This is likely from CKD and relative iron deficiency. - We will check ferritin, iron panel, T73, folic acid, methylmalonic acid, copper levels along with LDH and reticulocyte count.    No problem-specific Assessment & Plan notes found for this encounter.  Orders Placed This Encounter  Procedures   CT Abdomen Pelvis W Wo Contrast    Renal Mass Protocol    Standing Status:   Future    Standing Expiration Date:   03/03/2022    Order  Specific Question:   If indicated for the ordered procedure, I authorize the administration of contrast media per Radiology protocol    Answer:   Yes    Order Specific Question:   Preferred imaging location?    Answer:   [facility]    Order Specific Question:   Release to patient    Answer:   Immediate    Order Specific Question:   Is Oral Contrast requested for this exam?    Answer:   Yes, Per Radiology protocol   CT Chest Wo Contrast    Standing Status:   Future    Standing Expiration Date:   03/03/2022    Order Specific Question:   Preferred imaging location?    Answer:   [facility]    Order Specific Question:   Release to patient    Answer:   Immediate   CBC with Differential    Standing Status:   Future    Number of Occurrences:   1    Standing Expiration Date:   03/03/2022   Comprehensive metabolic panel    Standing Status:   Future    Number of Occurrences:   1    Standing Expiration Date:   03/03/2022   Lactate dehydrogenase    Standing Status:   Future    Number of Occurrences:   1    Standing Expiration Date:   03/03/2022   Iron and TIBC ([place] [facility] ONLY)    Standing Status:   Future    Number of Occurrences:   1    Standing Expiration Date:   03/03/2022   Ferritin    Standing Status:   Future    Number of Occurrences:   1    Standing Expiration Date:   03/03/2022   Vitamin B12    Standing Status:   Future    Number of Occurrences:   1    Standing Expiration Date:   03/03/2022   Folate    Standing Status:   Future    Number of Occurrences:   1    Standing Expiration Date:   03/03/2022   Methylmalonic acid, serum    Standing  Status:   Future    Number of Occurrences:   1    Standing Expiration Date:   03/03/2022   Reticulocytes    Standing Status:   Future    Number of Occurrences:   1    Standing Expiration Date:   03/03/2022   Copper, serum    Standing Status:   Future    Number of Occurrences:   1    Standing Expiration Date:   03/03/2022     All questions were answered. The patient knows to call the clinic with any problems, questions or concerns.     Derek Jack, MD 03/03/2021 4:29 PM   I, Thana Ates, am acting as a scribe for Dr. Derek Jack.   I, Derek Jack MD, have reviewed the above documentation for accuracy and completeness, and I agree with the above.

## 2021-03-05 ENCOUNTER — Ambulatory Visit (HOSPITAL_COMMUNITY): Payer: No Typology Code available for payment source | Admitting: Hematology

## 2021-03-05 ENCOUNTER — Encounter (HOSPITAL_COMMUNITY): Payer: Self-pay

## 2021-03-05 LAB — METHYLMALONIC ACID, SERUM: Methylmalonic Acid, Quantitative: 206 nmol/L (ref 0–378)

## 2021-03-05 NOTE — Progress Notes (Signed)
I met with the patient and his wife during and following initial visit with Dr. Delton Coombes. I introduced myself and explained my role in the patient's care. I provided my contact information and encouraged them to call with questions or concerns.

## 2021-03-06 LAB — COPPER, SERUM: Copper: 165 ug/dL — ABNORMAL HIGH (ref 69–132)

## 2021-03-11 ENCOUNTER — Ambulatory Visit (HOSPITAL_COMMUNITY): Payer: Medicare HMO

## 2021-03-17 ENCOUNTER — Ambulatory Visit (HOSPITAL_COMMUNITY): Payer: No Typology Code available for payment source | Admitting: Hematology

## 2021-03-20 ENCOUNTER — Ambulatory Visit (HOSPITAL_COMMUNITY)
Admission: RE | Admit: 2021-03-20 | Discharge: 2021-03-20 | Disposition: A | Discharge status: Home / Self Care | Payer: Medicare HMO | Source: Ambulatory Visit | Attending: Hematology | Admitting: Hematology

## 2021-03-20 ENCOUNTER — Other Ambulatory Visit: Payer: Self-pay

## 2021-03-20 DIAGNOSIS — K5730 Diverticulosis of large intestine without perforation or abscess without bleeding: Secondary | ICD-10-CM | POA: Insufficient documentation

## 2021-03-20 DIAGNOSIS — I700 Atherosclerosis of aorta: Secondary | ICD-10-CM | POA: Diagnosis not present

## 2021-03-20 DIAGNOSIS — N2889 Other specified disorders of kidney and ureter: Secondary | ICD-10-CM | POA: Insufficient documentation

## 2021-03-20 DIAGNOSIS — N281 Cyst of kidney, acquired: Secondary | ICD-10-CM | POA: Diagnosis not present

## 2021-03-20 DIAGNOSIS — Z0389 Encounter for observation for other suspected diseases and conditions ruled out: Secondary | ICD-10-CM | POA: Diagnosis not present

## 2021-03-20 MED ORDER — IOHEXOL 300 MG/ML  SOLN
80.0000 mL | Freq: Once | INTRAMUSCULAR | Status: AC | PRN
  Administered 2021-03-20: 80 mL via INTRAVENOUS

## 2021-03-24 ENCOUNTER — Other Ambulatory Visit: Payer: Self-pay

## 2021-03-24 ENCOUNTER — Inpatient Hospital Stay (HOSPITAL_COMMUNITY): Payer: Medicare HMO | Admitting: Hematology

## 2021-03-24 VITALS — BP 178/75 | HR 73 | Temp 96.5°F | Resp 18 | Ht 68.0 in | Wt 210.0 lb

## 2021-03-24 DIAGNOSIS — C61 Malignant neoplasm of prostate: Secondary | ICD-10-CM | POA: Diagnosis not present

## 2021-03-24 DIAGNOSIS — Z79899 Other long term (current) drug therapy: Secondary | ICD-10-CM | POA: Diagnosis not present

## 2021-03-24 DIAGNOSIS — D649 Anemia, unspecified: Secondary | ICD-10-CM | POA: Diagnosis not present

## 2021-03-24 DIAGNOSIS — N2889 Other specified disorders of kidney and ureter: Secondary | ICD-10-CM

## 2021-03-24 DIAGNOSIS — Z800 Family history of malignant neoplasm of digestive organs: Secondary | ICD-10-CM | POA: Diagnosis not present

## 2021-03-24 DIAGNOSIS — Z803 Family history of malignant neoplasm of breast: Secondary | ICD-10-CM | POA: Diagnosis not present

## 2021-03-24 DIAGNOSIS — Z923 Personal history of irradiation: Secondary | ICD-10-CM | POA: Diagnosis not present

## 2021-03-24 NOTE — Progress Notes (Signed)
Todd Gates, [place] [address]   CLINIC:  Medical Oncology/Hematology  PCP:  Bonnita Hollow, MD [address] [place] [address] [place] [address] [phone]   REASON FOR VISIT:  Follow-up for right kidney mass  PRIOR THERAPY: prostatectomy - 2008  NGS Results: not done  CURRENT THERAPY: under work-up  BRIEF ONCOLOGIC HISTORY:  Oncology History   No history exists.    CANCER STAGING:  Cancer Staging  No matching staging information was found for the patient.  INTERVAL HISTORY:  Todd Gates, a 76 y.o. male, returns for routine follow-up of his right kidney mass. Todd Gates was last seen on 03/03/2021.   Today he reports feeling good. He is to be seen at [facility] on 03/27/21.  REVIEW OF SYSTEMS:  Review of Systems  Constitutional:  Negative for appetite change and fatigue.  Gastrointestinal:  Positive for diarrhea.  All other systems reviewed and are negative.  PAST MEDICAL/SURGICAL HISTORY:  Past Medical History:  Diagnosis Date   Arthritis    Cancer [facility])    prostate; prostatectomy; radiation   Diabetes mellitus without complication ([place])    Fatty liver    Glaucoma    Both eyes   Gout    HTN (hypertension)    Peptic ulcer disease    Sleep apnea    Past Surgical History:  Procedure Laterality Date   COLONOSCOPY W/ POLYPECTOMY  02/2020   PROSTATE SURGERY     PROSTATECTOMY  2008   TONSILLECTOMY     TOTAL HIP ARTHROPLASTY Left 10/15/2020   Procedure: LEFT TOTAL HIP ARTHROPLASTY ANTERIOR APPROACH;  Surgeon: Mcarthur Rossetti, MD;  Location: [facility];  Service: Orthopedics;  Laterality: Left;    SOCIAL HISTORY:  Social History   Socioeconomic History   Marital status: Married    Spouse name: Not on file   Number of children: Not on file   Years of education: Not on file   Highest education level: Not on file  Occupational History   Not on file  Tobacco Use   Smoking status: Unknown   Smokeless tobacco: Never   Vaping Use   Vaping Use: Not on file  Substance and Sexual Activity   Alcohol use: No   Drug use: No   Sexual activity: Not on file  Other Topics Concern   Not on file  Social History Narrative   Not on file   Social Determinants of Health   Financial Resource Strain: Not on file  Food Insecurity: Not on file  Transportation Needs: Not on file  Physical Activity: Not on file  Stress: Not on file  Social Connections: Not on file  Intimate Partner Violence: Not on file    FAMILY HISTORY:  Family History  Problem Relation Age of Onset   Hypertension Unknown     CURRENT MEDICATIONS:  Current Outpatient Medications  Medication Sig Dispense Refill   acetaminophen (TYLENOL) 500 MG tablet Take 500 mg by mouth every 6 (six) hours as needed for mild pain.     amoxicillin (AMOXIL) 500 MG tablet 2 po 1 hour prior to procedure  and 2 po 6 hours after procedure 4 tablet 0   atorvastatin (LIPITOR) 10 MG tablet Take 10 mg by mouth daily.     Brinzolamide-Brimonidine (SIMBRINZA OP) Place 1 drop into both eyes in the morning, at noon, and at bedtime.     Calcium Carbonate (CALCIUM 600 PO) Take 600 mg by mouth daily.  Carboxymethylcellulose Sodium (REFRESH CELLUVISC OP) Place 1 drop into both eyes at bedtime.     chlorthalidone (HYGROTON) 25 MG tablet Take 25 mg by mouth daily.     febuxostat (ULORIC) 40 MG tablet Take 40 mg by mouth daily as needed (gout flares).     lisinopril (ZESTRIL) 40 MG tablet Take 40 mg by mouth daily.     meloxicam (MOBIC) 15 MG tablet Take 15 mg by mouth daily as needed for pain.     metFORMIN (GLUCOPHAGE-XR) 750 MG 24 hr tablet Take 750 mg by mouth daily.     oxyCODONE (OXY IR/ROXICODONE) 5 MG immediate release tablet Take 1-2 tablets (5-10 mg total) by mouth every 4 (four) hours as needed for moderate pain (pain score 4-6). 30 tablet 0   Pediatric Multiple Vit-C-FA (CHEWABLE VITE CHILDRENS) CHEW Chew 1 Piece by mouth daily.     No current  facility-administered medications for this visit.    ALLERGIES:  No Known Allergies  PHYSICAL EXAM:  Performance status (ECOG): 1 - Symptomatic but completely ambulatory  Vitals:   03/24/21 0941  BP: (!) 178/75  Pulse: 73  Resp: 18  Temp: (!) 96.5 F (35.8 C)  SpO2: 96%   Wt Readings from Last 3 Encounters:  03/24/21 210 lb (95.3 kg)  10/15/20 206 lb (93.4 kg)  10/08/20 206 lb 6 oz (93.6 kg)   Physical Exam Vitals reviewed.  Constitutional:      Appearance: Normal appearance. He is obese.  Cardiovascular:     Rate and Rhythm: Normal rate and regular rhythm.     Pulses: Normal pulses.     Heart sounds: Normal heart sounds.  Pulmonary:     Effort: Pulmonary effort is normal.     Breath sounds: Normal breath sounds.  Neurological:     General: No focal deficit present.     Mental Status: He is alert and oriented to person, place, and time.  Psychiatric:        Mood and Affect: Mood normal.        Behavior: Behavior normal.     LABORATORY DATA:  I have reviewed the labs as listed.  CBC Latest Ref Rng & Units 03/03/2021 10/16/2020 10/08/2020  WBC 4.0 - 10.5 K/uL 9.8 8.9 9.0  Hemoglobin 13.0 - 17.0 g/dL 11.2(L) 9.6(L) 10.9(L)  Hematocrit 39.0 - 52.0 % 34.3(L) 29.1(L) 34.1(L)  Platelets 150 - 400 K/uL 296 240 287   CMP Latest Ref Rng & Units 03/03/2021 10/16/2020 10/08/2020  Glucose 70 - 99 mg/dL 113(H) 172(H) 118(H)  BUN 8 - 23 mg/dL 31(H) 29(H) 24(H)  Creatinine 0.61 - 1.24 mg/dL 1.55(H) 1.68(H) 1.57(H)  Sodium 135 - 145 mmol/L 139 136 140  Potassium 3.5 - 5.1 mmol/L 3.7 4.1 3.8  Chloride 98 - 111 mmol/L 107 105 105  CO2 22 - 32 mmol/L 21(L) 24 26  Calcium 8.9 - 10.3 mg/dL 9.8 9.0 10.0  Total Protein 6.5 - 8.1 g/dL 8.3(H) - -  Total Bilirubin 0.3 - 1.2 mg/dL 0.7 - -  Alkaline Phos 38 - 126 U/L 93 - -  AST 15 - 41 U/L 23 - -  ALT 0 - 44 U/L 18 - -    DIAGNOSTIC IMAGING:  I have independently reviewed the scans and discussed with the patient. CT Abdomen Pelvis W  Wo Contrast  Result Date: 03/21/2021 CLINICAL DATA:  Right renal mass on recent ultrasound. Previous prostatectomy and radiation therapy for prostate carcinoma. EXAM: CT CHEST WITHOUT CONTRAST CT ABDOMEN AND PELVIS  WITH AND WITHOUT CONTRAST TECHNIQUE: Multidetector CT imaging of the chest was performed prior to intravenous contrast administration. Multidetector CT imaging of the abdomen and pelvis was subsequently performed following the standard protocol before and during bolus administration of intravenous contrast. RADIATION DOSE REDUCTION: This exam was performed according to the departmental dose-optimization program which includes automated exposure control, adjustment of the mA and/or kV according to patient size and/or use of iterative reconstruction technique. CONTRAST:  67mL OMNIPAQUE IOHEXOL 300 MG/ML  SOLN COMPARISON:  Abdomen MRI on 02/18/2021 FINDINGS: CT CHEST FINDINGS Cardiovascular: No acute findings. Aortic and coronary atherosclerotic calcification noted. Mediastinum/Nodes: No masses or pathologically enlarged lymph nodes identified on this unenhanced exam. Lungs/Pleura: No suspicious nodules or masses identified. No evidence of infiltrate or pleural effusion. Musculoskeletal:  No suspicious bone lesions. CT ABDOMEN AND PELVIS FINDINGS Hepatobiliary: A few small hepatic cysts are stable. No hepatic masses identified. Gallbladder is unremarkable. No evidence of biliary ductal dilatation. Pancreas:  No mass or inflammatory changes. Spleen: Within normal limits in size and appearance. Adrenals/Urinary Tract: Normal appearance the adrenal glands. Probable tiny sub-cm cyst in midpole of left kidney remains stable. A 1.1 cm subcapsular Bosniak category 2 cyst in the lateral midpole of the right kidney remains stable. A subtle mass is again seen in the lower pole of the right kidney which shows evidence of heterogeneous contrast enhancement on subtraction imaging. This is not as well visualized as on  recent MR, but measures approximately 2.4 x 2.1 cm, without significant change. This remains suspicious for renal cell carcinoma. No evidence of hydronephrosis. Stomach/Bowel: No evidence of obstruction, inflammatory process or abnormal fluid collections. Normal appendix visualized. Diffuse colonic diverticulosis is again seen, without evidence of diverticulitis. Vascular/Lymphatic: No pathologically enlarged lymph nodes. No acute vascular findings. Aortic atherosclerotic calcification noted. Reproductive: Prior prostatectomy noted. Limited visualization due to significant artifact from left hip prosthesis. Other:  None. Musculoskeletal:  No suspicious bone lesions identified. IMPRESSION: 2.4 cm heterogeneously enhancing mass in lower pole of right kidney is not as well visualized as on recent MR, but remains suspicious for renal cell carcinoma. No evidence of metastatic disease within the chest, abdomen, or pelvis. Colonic diverticulosis, without radiographic evidence of diverticulitis. Aortic Atherosclerosis (ICD10-I70.0). Electronically Signed   By: Marlaine Hind M.D.   On: 03/21/2021 14:26   CT Chest Wo Contrast  Result Date: 03/21/2021 CLINICAL DATA:  Right renal mass on recent ultrasound. Previous prostatectomy and radiation therapy for prostate carcinoma. EXAM: CT CHEST WITHOUT CONTRAST CT ABDOMEN AND PELVIS WITH AND WITHOUT CONTRAST TECHNIQUE: Multidetector CT imaging of the chest was performed prior to intravenous contrast administration. Multidetector CT imaging of the abdomen and pelvis was subsequently performed following the standard protocol before and during bolus administration of intravenous contrast. RADIATION DOSE REDUCTION: This exam was performed according to the departmental dose-optimization program which includes automated exposure control, adjustment of the mA and/or kV according to patient size and/or use of iterative reconstruction technique. CONTRAST:  90mL OMNIPAQUE IOHEXOL 300 MG/ML   SOLN COMPARISON:  Abdomen MRI on 02/18/2021 FINDINGS: CT CHEST FINDINGS Cardiovascular: No acute findings. Aortic and coronary atherosclerotic calcification noted. Mediastinum/Nodes: No masses or pathologically enlarged lymph nodes identified on this unenhanced exam. Lungs/Pleura: No suspicious nodules or masses identified. No evidence of infiltrate or pleural effusion. Musculoskeletal:  No suspicious bone lesions. CT ABDOMEN AND PELVIS FINDINGS Hepatobiliary: A few small hepatic cysts are stable. No hepatic masses identified. Gallbladder is unremarkable. No evidence of biliary ductal dilatation. Pancreas:  No mass or  inflammatory changes. Spleen: Within normal limits in size and appearance. Adrenals/Urinary Tract: Normal appearance the adrenal glands. Probable tiny sub-cm cyst in midpole of left kidney remains stable. A 1.1 cm subcapsular Bosniak category 2 cyst in the lateral midpole of the right kidney remains stable. A subtle mass is again seen in the lower pole of the right kidney which shows evidence of heterogeneous contrast enhancement on subtraction imaging. This is not as well visualized as on recent MR, but measures approximately 2.4 x 2.1 cm, without significant change. This remains suspicious for renal cell carcinoma. No evidence of hydronephrosis. Stomach/Bowel: No evidence of obstruction, inflammatory process or abnormal fluid collections. Normal appendix visualized. Diffuse colonic diverticulosis is again seen, without evidence of diverticulitis. Vascular/Lymphatic: No pathologically enlarged lymph nodes. No acute vascular findings. Aortic atherosclerotic calcification noted. Reproductive: Prior prostatectomy noted. Limited visualization due to significant artifact from left hip prosthesis. Other:  None. Musculoskeletal:  No suspicious bone lesions identified. IMPRESSION: 2.4 cm heterogeneously enhancing mass in lower pole of right kidney is not as well visualized as on recent MR, but remains  suspicious for renal cell carcinoma. No evidence of metastatic disease within the chest, abdomen, or pelvis. Colonic diverticulosis, without radiographic evidence of diverticulitis. Aortic Atherosclerosis (ICD10-I70.0). Electronically Signed   By: Marlaine Hind M.D.   On: 03/21/2021 14:26     ASSESSMENT:  Right kidney mass: - Renal ultrasound ordered by Dr. Marval Regal on 12/24/2020 showed 2.4 x 2.1 x 2.3 cm lower pole solid-appearing lesion. - MRI abdomen with and without contrast on 02/19/2021 at [facility] showed complex partially cystic and partially heterogeneous enhancing mass identified in the lower pole of the right kidney measuring 2.4 x 2.4 cm, given limitations of motion. - Denies any B symptoms.  Denies any hematuria.    Social/family history: - Lives at home with his wife. 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  Also exposed to agent orange in [place]. - Non-smoker nondrinker. - Sister had breast cancer.  Another sister had stomach cancer.  Mother had breast cancer.  3.  Prostate cancer: - Status post robotic prostatectomy.  8 years later PSA started going up.  He underwent XRT and was started on Lupron every 6 months.  He thinks he has been on it for the last 3 years.  Last PSA was reportedly 0.10.  He follows up at [facility] in [place].   PLAN:  Right kidney mass: - We have reviewed CT AP with and without contrast results which showed 2.4 cm heterogeneously enhancing mass in the lower pole of the right kidney, suspicious for RCC. - CT of the chest did not show any metastatic disease. - Reviewed treatment options for stage I (T1a) including partial nephrectomy/ablation/active surveillance. - We will also consider genetic testing based on family history and personal history. - Patient has a appointment to see [facility] on 03/27/2021.  2.  Normocytic anemia: - Combination anemia from relative iron deficiency and CKD. - Ferritin is 126 and percent saturation 14.  K02 and  folic acid and copper were normal. - Recommend parenteral iron therapy with Venofer x3. - Discussed side effects in detail. - RTC 3 months with repeat CBC, ferritin and iron panel.   Orders placed this encounter:  No orders of the defined types were placed in this encounter.    Derek Jack, MD [place] [phone]   I, Thana Ates, am acting as a scribe for Dr. Derek Jack.  Kinnie Scales MD, have reviewed the above  documentation for accuracy and completeness, and I agree with the above.

## 2021-03-24 NOTE — Patient Instructions (Addendum)
[place] at [facility] Discharge Instructions   You were seen and examined today by Dr. Delton Coombes.  Follow up with [facility] as scheduled on 03/27/2021.   He reviewed the results of your lab results.  You would benefit from receiving IV iron infusions.  We will schedule you for 3 infusions scheduled 1 week apart.  This will help get your hemoglobin to a normal level.   Return as scheduled for iron infusions, lab work, and office visit.     Thank you for choosing [place] at [facility] to provide your oncology and hematology care.  To afford each patient quality time with our provider, please arrive at least 15 minutes before your scheduled appointment time.   If you have a lab appointment with the [facility] please come in thru the Main Entrance and check in at the main information desk.  You need to re-schedule your appointment should you arrive 10 or more minutes late.  We strive to give you quality time with our providers, and arriving late affects you and other patients whose appointments are after yours.  Also, if you no show three or more times for appointments you may be dismissed from the clinic at the providers discretion.     Again, thank you for choosing [facility].  Our hope is that these requests will decrease the amount of time that you wait before being seen by our physicians.       _____________________________________________________________  Should you have questions after your visit to [facility], please contact our office at [phone] and follow the prompts.  Our office hours are 8:00 a.m. and 4:30 p.m. Monday - Friday.  Please note that voicemails left after 4:00 p.m. may not be returned until the following business day.  We are closed weekends and major holidays.  You do have access to a nurse 24-7, just call the main number to the clinic [phone] and do not press any options,  hold on the line and a nurse will answer the phone.    For prescription refill requests, have your pharmacy contact our office and allow 72 hours.    Due to Covid, you will need to wear a mask upon entering the hospital. If you do not have a mask, a mask will be given to you at the Main Entrance upon arrival. For doctor visits, patients may have 1 support person age 70 or older with them. For treatment visits, patients can not have anyone with them due to social distancing guidelines and our immunocompromised population.

## 2021-03-25 ENCOUNTER — Other Ambulatory Visit (HOSPITAL_COMMUNITY): Payer: Self-pay | Admitting: *Deleted

## 2021-03-25 DIAGNOSIS — D649 Anemia, unspecified: Secondary | ICD-10-CM

## 2021-03-31 DIAGNOSIS — D509 Iron deficiency anemia, unspecified: Secondary | ICD-10-CM | POA: Insufficient documentation

## 2021-03-31 DIAGNOSIS — B351 Tinea unguium: Secondary | ICD-10-CM | POA: Diagnosis not present

## 2021-03-31 DIAGNOSIS — M2010 Hallux valgus (acquired), unspecified foot: Secondary | ICD-10-CM | POA: Diagnosis not present

## 2021-04-01 ENCOUNTER — Inpatient Hospital Stay (HOSPITAL_COMMUNITY): Payer: Medicare HMO | Attending: Hematology

## 2021-04-01 ENCOUNTER — Encounter (HOSPITAL_COMMUNITY): Payer: Self-pay

## 2021-04-01 ENCOUNTER — Other Ambulatory Visit: Payer: Self-pay

## 2021-04-01 VITALS — BP 144/64 | HR 60 | Temp 97.9°F | Resp 18

## 2021-04-01 DIAGNOSIS — D631 Anemia in chronic kidney disease: Secondary | ICD-10-CM | POA: Diagnosis present

## 2021-04-01 DIAGNOSIS — N189 Chronic kidney disease, unspecified: Secondary | ICD-10-CM | POA: Insufficient documentation

## 2021-04-01 DIAGNOSIS — D649 Anemia, unspecified: Secondary | ICD-10-CM

## 2021-04-01 DIAGNOSIS — D509 Iron deficiency anemia, unspecified: Secondary | ICD-10-CM

## 2021-04-01 MED ORDER — ACETAMINOPHEN 325 MG PO TABS
650.0000 mg | ORAL_TABLET | Freq: Once | ORAL | Status: AC
  Administered 2021-04-01: 650 mg via ORAL
  Filled 2021-04-01: qty 2

## 2021-04-01 MED ORDER — SODIUM CHLORIDE 0.9 % IV SOLN
300.0000 mg | Freq: Once | INTRAVENOUS | Status: AC
  Administered 2021-04-01: 300 mg via INTRAVENOUS
  Filled 2021-04-01: qty 300

## 2021-04-01 MED ORDER — LORATADINE 10 MG PO TABS
10.0000 mg | ORAL_TABLET | Freq: Once | ORAL | Status: AC
  Administered 2021-04-01: 10 mg via ORAL
  Filled 2021-04-01: qty 1

## 2021-04-01 MED ORDER — SODIUM CHLORIDE 0.9 % IV SOLN: Freq: Once | INTRAVENOUS | Status: AC

## 2021-04-01 NOTE — Patient Instructions (Signed)

## 2021-04-01 NOTE — Progress Notes (Signed)
Information given for Venofer with all questions asked and answered.  No s/s of distress noted.   Patient tolerated iron infusion with no complaints voiced.  Peripheral IV site clean and dry with good blood return noted before and after infusion.  Band aid applied.  VSS with discharge and left in satisfactory condition with no s/s of distress noted.

## 2021-04-08 ENCOUNTER — Inpatient Hospital Stay (HOSPITAL_COMMUNITY): Payer: Medicare HMO

## 2021-04-08 ENCOUNTER — Other Ambulatory Visit: Payer: Self-pay

## 2021-04-08 ENCOUNTER — Encounter (HOSPITAL_COMMUNITY): Payer: Self-pay

## 2021-04-08 VITALS — BP 130/58 | HR 58 | Temp 96.7°F | Resp 18

## 2021-04-08 DIAGNOSIS — D631 Anemia in chronic kidney disease: Secondary | ICD-10-CM | POA: Diagnosis not present

## 2021-04-08 DIAGNOSIS — N189 Chronic kidney disease, unspecified: Secondary | ICD-10-CM | POA: Diagnosis not present

## 2021-04-08 DIAGNOSIS — D509 Iron deficiency anemia, unspecified: Secondary | ICD-10-CM

## 2021-04-08 DIAGNOSIS — D649 Anemia, unspecified: Secondary | ICD-10-CM

## 2021-04-08 MED ORDER — ACETAMINOPHEN 325 MG PO TABS
650.0000 mg | ORAL_TABLET | Freq: Once | ORAL | Status: AC
  Administered 2021-04-08: 650 mg via ORAL
  Filled 2021-04-08: qty 2

## 2021-04-08 MED ORDER — LORATADINE 10 MG PO TABS
10.0000 mg | ORAL_TABLET | Freq: Once | ORAL | Status: AC
  Administered 2021-04-08: 10 mg via ORAL
  Filled 2021-04-08: qty 1

## 2021-04-08 MED ORDER — SODIUM CHLORIDE 0.9 % IV SOLN: Freq: Once | INTRAVENOUS | Status: AC

## 2021-04-08 MED ORDER — SODIUM CHLORIDE 0.9 % IV SOLN
300.0000 mg | Freq: Once | INTRAVENOUS | Status: AC
  Administered 2021-04-08: 300 mg via INTRAVENOUS
  Filled 2021-04-08: qty 300

## 2021-04-08 NOTE — Progress Notes (Signed)
Venofer 300 mg given today per MD orders. Tolerated infusion without adverse affects. Vital signs stable. No complaints at this time. Discharged from clinic ambulatory in stable condition. Alert and oriented x 3. F/U with  Cancer Center as scheduled.   

## 2021-04-08 NOTE — Patient Instructions (Signed)
[facility]  Discharge Instructions: °Thank you for choosing [facility] to provide your oncology and hematology care.  °If you have a lab appointment with the Cancer Center, please come in thru the Main Entrance and check in at the main information desk. ° °Wear comfortable clothing and clothing appropriate for easy access to any Portacath or PICC line.  ° °We strive to give you quality time with your provider. You may need to reschedule your appointment if you arrive late (15 or more minutes).  Arriving late affects you and other patients whose appointments are after yours.  Also, if you miss three or more appointments without notifying the office, you may be dismissed from the clinic at the provider’s discretion.    °  °For prescription refill requests, have your pharmacy contact our office and allow 72 hours for refills to be completed.   ° °Today you received the following : Venofer 300 mg.     °  °To help prevent nausea and vomiting after your treatment, we encourage you to take your nausea medication as directed. ° °BELOW ARE SYMPTOMS THAT SHOULD BE REPORTED IMMEDIATELY: °*FEVER GREATER THAN 100.4 F (38 °C) OR HIGHER °*CHILLS OR SWEATING °*NAUSEA AND VOMITING THAT IS NOT CONTROLLED WITH YOUR NAUSEA MEDICATION °*UNUSUAL SHORTNESS OF BREATH °*UNUSUAL BRUISING OR BLEEDING °*URINARY PROBLEMS (pain or burning when urinating, or frequent urination) °*BOWEL PROBLEMS (unusual diarrhea, constipation, pain near the anus) °TENDERNESS IN MOUTH AND THROAT WITH OR WITHOUT PRESENCE OF ULCERS (sore throat, sores in mouth, or a toothache) °UNUSUAL RASH, SWELLING OR PAIN  °UNUSUAL VAGINAL DISCHARGE OR ITCHING  ° °Items with * indicate a potential emergency and should be followed up as soon as possible or go to the Emergency Department if any problems should occur. ° °Please show the CHEMOTHERAPY ALERT CARD or IMMUNOTHERAPY ALERT CARD at check-in to the Emergency Department and triage nurse. ° °Should  you have questions after your visit or need to cancel or reschedule your appointment, please contact [facility] [phone]  and follow the prompts.  Office hours are 8:00 a.m. to 4:30 p.m. Monday - Friday. Please note that voicemails left after 4:00 p.m. may not be returned until the following business day.  We are closed weekends and major holidays. You have access to a nurse at all times for urgent questions. Please call the main number to the clinic [phone] and follow the prompts. ° °For any non-urgent questions, you may also contact your provider using MyChart. We now offer e-Visits for anyone 18 and older to request care online for non-urgent symptoms. For details visit [email or web address]. °  °Also download the MyChart app! Go to the app store, search "MyChart", open the app, select [facility], and log in with your MyChart username and password. ° °Due to Covid, a mask is required upon entering the hospital/clinic. If you do not have a mask, one will be given to you upon arrival. For doctor visits, patients may have 1 support person aged 18 or older with them. For treatment visits, patients cannot have anyone with them due to current Covid guidelines and our immunocompromised population.  °

## 2021-04-18 ENCOUNTER — Other Ambulatory Visit: Payer: Self-pay

## 2021-04-18 ENCOUNTER — Inpatient Hospital Stay (HOSPITAL_COMMUNITY): Payer: Medicare HMO

## 2021-04-18 VITALS — BP 141/53 | HR 69 | Temp 97.8°F | Resp 18

## 2021-04-18 DIAGNOSIS — D509 Iron deficiency anemia, unspecified: Secondary | ICD-10-CM

## 2021-04-18 DIAGNOSIS — D631 Anemia in chronic kidney disease: Secondary | ICD-10-CM | POA: Diagnosis not present

## 2021-04-18 DIAGNOSIS — D649 Anemia, unspecified: Secondary | ICD-10-CM

## 2021-04-18 DIAGNOSIS — N189 Chronic kidney disease, unspecified: Secondary | ICD-10-CM | POA: Diagnosis not present

## 2021-04-18 MED ORDER — LORATADINE 10 MG PO TABS
10.0000 mg | ORAL_TABLET | Freq: Once | ORAL | Status: AC
  Administered 2021-04-18: 10 mg via ORAL
  Filled 2021-04-18: qty 1

## 2021-04-18 MED ORDER — ACETAMINOPHEN 325 MG PO TABS
650.0000 mg | ORAL_TABLET | Freq: Once | ORAL | Status: AC
  Administered 2021-04-18: 650 mg via ORAL
  Filled 2021-04-18: qty 2

## 2021-04-18 MED ORDER — SODIUM CHLORIDE 0.9 % IV SOLN: Freq: Once | INTRAVENOUS | Status: AC

## 2021-04-18 MED ORDER — SODIUM CHLORIDE 0.9 % IV SOLN
400.0000 mg | Freq: Once | INTRAVENOUS | Status: AC
  Administered 2021-04-18: 400 mg via INTRAVENOUS
  Filled 2021-04-18: qty 20

## 2021-04-18 NOTE — Patient Instructions (Signed)
[facility]  Discharge Instructions: Thank you for choosing [facility] to provide your oncology and hematology care.  If you have a lab appointment with the Cancer Center, please come in thru the Main Entrance and check in at the main information desk.  Wear comfortable clothing and clothing appropriate for easy access to any Portacath or PICC line.   We strive to give you quality time with your provider. You may need to reschedule your appointment if you arrive late (15 or more minutes).  Arriving late affects you and other patients whose appointments are after yours.  Also, if you miss three or more appointments without notifying the office, you may be dismissed from the clinic at the provider's discretion.      For prescription refill requests, have your pharmacy contact our office and allow 72 hours for refills to be completed.        To help prevent nausea and vomiting after your treatment, we encourage you to take your nausea medication as directed.  BELOW ARE SYMPTOMS THAT SHOULD BE REPORTED IMMEDIATELY: *FEVER GREATER THAN 100.4 F (38 C) OR HIGHER *CHILLS OR SWEATING *NAUSEA AND VOMITING THAT IS NOT CONTROLLED WITH YOUR NAUSEA MEDICATION *UNUSUAL SHORTNESS OF BREATH *UNUSUAL BRUISING OR BLEEDING *URINARY PROBLEMS (pain or burning when urinating, or frequent urination) *BOWEL PROBLEMS (unusual diarrhea, constipation, pain near the anus) TENDERNESS IN MOUTH AND THROAT WITH OR WITHOUT PRESENCE OF ULCERS (sore throat, sores in mouth, or a toothache) UNUSUAL RASH, SWELLING OR PAIN  UNUSUAL VAGINAL DISCHARGE OR ITCHING   Items with * indicate a potential emergency and should be followed up as soon as possible or go to the Emergency Department if any problems should occur.  Please show the CHEMOTHERAPY ALERT CARD or IMMUNOTHERAPY ALERT CARD at check-in to the Emergency Department and triage nurse.  Should you have questions after your visit or need to cancel  or reschedule your appointment, please contact [facility] [phone]  and follow the prompts.  Office hours are 8:00 a.m. to 4:30 p.m. Monday - Friday. Please note that voicemails left after 4:00 p.m. may not be returned until the following business day.  We are closed weekends and major holidays. You have access to a nurse at all times for urgent questions. Please call the main number to the clinic [phone] and follow the prompts.  For any non-urgent questions, you may also contact your provider using MyChart. We now offer e-Visits for anyone 18 and older to request care online for non-urgent symptoms. For details visit [email or web address].   Also download the MyChart app! Go to the app store, search "MyChart", open the app, select [facility], and log in with your MyChart username and password.  Due to Covid, a mask is required upon entering the hospital/clinic. If you do not have a mask, one will be given to you upon arrival. For doctor visits, patients may have 1 support person aged 18 or older with them. For treatment visits, patients cannot have anyone with them due to current Covid guidelines and our immunocompromised population.  

## 2021-04-18 NOTE — Progress Notes (Signed)
Patient tolerated iron infusion with no complaints voiced.  Peripheral IV site clean and dry with good blood return noted before and after infusion.  Band aid applied.  VSS with discharge and left in satisfactory condition with no s/s of distress noted.   

## 2021-05-02 ENCOUNTER — Other Ambulatory Visit: Payer: Self-pay

## 2021-05-02 ENCOUNTER — Ambulatory Visit (HOSPITAL_COMMUNITY)
Admission: RE | Admit: 2021-05-02 | Discharge: 2021-05-02 | Disposition: A | Discharge status: Home / Self Care | Payer: Medicare HMO | Source: Ambulatory Visit | Attending: Urology | Admitting: Urology

## 2021-05-02 ENCOUNTER — Other Ambulatory Visit (HOSPITAL_COMMUNITY): Payer: Self-pay | Admitting: Urology

## 2021-05-02 DIAGNOSIS — I82220 Acute embolism and thrombosis of inferior vena cava: Secondary | ICD-10-CM | POA: Insufficient documentation

## 2021-05-02 DIAGNOSIS — D49511 Neoplasm of unspecified behavior of right kidney: Secondary | ICD-10-CM | POA: Insufficient documentation

## 2021-05-02 DIAGNOSIS — Z01818 Encounter for other preprocedural examination: Secondary | ICD-10-CM | POA: Diagnosis not present

## 2021-05-07 ENCOUNTER — Other Ambulatory Visit: Payer: Self-pay | Admitting: Urology

## 2021-05-07 ENCOUNTER — Other Ambulatory Visit (HOSPITAL_COMMUNITY): Payer: Self-pay | Admitting: Urology

## 2021-05-07 DIAGNOSIS — D49511 Neoplasm of unspecified behavior of right kidney: Secondary | ICD-10-CM

## 2021-05-13 DIAGNOSIS — J309 Allergic rhinitis, unspecified: Secondary | ICD-10-CM | POA: Diagnosis not present

## 2021-05-13 DIAGNOSIS — K08409 Partial loss of teeth, unspecified cause, unspecified class: Secondary | ICD-10-CM | POA: Diagnosis not present

## 2021-05-13 DIAGNOSIS — I951 Orthostatic hypotension: Secondary | ICD-10-CM | POA: Diagnosis not present

## 2021-05-13 DIAGNOSIS — G8929 Other chronic pain: Secondary | ICD-10-CM | POA: Diagnosis not present

## 2021-05-13 DIAGNOSIS — E785 Hyperlipidemia, unspecified: Secondary | ICD-10-CM | POA: Diagnosis not present

## 2021-05-13 DIAGNOSIS — E669 Obesity, unspecified: Secondary | ICD-10-CM | POA: Diagnosis not present

## 2021-05-13 DIAGNOSIS — E119 Type 2 diabetes mellitus without complications: Secondary | ICD-10-CM | POA: Diagnosis not present

## 2021-05-13 DIAGNOSIS — H409 Unspecified glaucoma: Secondary | ICD-10-CM | POA: Diagnosis not present

## 2021-05-13 DIAGNOSIS — M109 Gout, unspecified: Secondary | ICD-10-CM | POA: Diagnosis not present

## 2021-05-13 DIAGNOSIS — K219 Gastro-esophageal reflux disease without esophagitis: Secondary | ICD-10-CM | POA: Diagnosis not present

## 2021-05-13 DIAGNOSIS — G4733 Obstructive sleep apnea (adult) (pediatric): Secondary | ICD-10-CM | POA: Diagnosis not present

## 2021-05-13 DIAGNOSIS — I10 Essential (primary) hypertension: Secondary | ICD-10-CM | POA: Diagnosis not present

## 2021-05-17 ENCOUNTER — Ambulatory Visit (HOSPITAL_COMMUNITY)
Admission: RE | Admit: 2021-05-17 | Discharge: 2021-05-17 | Disposition: A | Discharge status: Home / Self Care | Payer: No Typology Code available for payment source | Source: Ambulatory Visit | Attending: Urology | Admitting: Urology

## 2021-05-17 DIAGNOSIS — K7689 Other specified diseases of liver: Secondary | ICD-10-CM | POA: Diagnosis not present

## 2021-05-17 DIAGNOSIS — K5730 Diverticulosis of large intestine without perforation or abscess without bleeding: Secondary | ICD-10-CM | POA: Diagnosis not present

## 2021-05-17 DIAGNOSIS — D49511 Neoplasm of unspecified behavior of right kidney: Secondary | ICD-10-CM | POA: Diagnosis present

## 2021-05-17 DIAGNOSIS — N2889 Other specified disorders of kidney and ureter: Secondary | ICD-10-CM | POA: Diagnosis not present

## 2021-05-17 DIAGNOSIS — N281 Cyst of kidney, acquired: Secondary | ICD-10-CM | POA: Diagnosis not present

## 2021-05-17 MED ORDER — GADOBUTROL 1 MMOL/ML IV SOLN
10.0000 mL | Freq: Once | INTRAVENOUS | Status: AC | PRN
  Administered 2021-05-17: 10 mL via INTRAVENOUS

## 2021-05-19 DIAGNOSIS — I10 Essential (primary) hypertension: Secondary | ICD-10-CM | POA: Diagnosis not present

## 2021-05-19 DIAGNOSIS — Z6832 Body mass index (BMI) 32.0-32.9, adult: Secondary | ICD-10-CM | POA: Diagnosis not present

## 2021-05-19 DIAGNOSIS — D649 Anemia, unspecified: Secondary | ICD-10-CM | POA: Diagnosis not present

## 2021-05-19 DIAGNOSIS — E785 Hyperlipidemia, unspecified: Secondary | ICD-10-CM | POA: Diagnosis not present

## 2021-05-19 DIAGNOSIS — E1169 Type 2 diabetes mellitus with other specified complication: Secondary | ICD-10-CM | POA: Diagnosis not present

## 2021-05-19 DIAGNOSIS — N2889 Other specified disorders of kidney and ureter: Secondary | ICD-10-CM | POA: Diagnosis not present

## 2021-05-23 ENCOUNTER — Other Ambulatory Visit: Payer: Self-pay | Admitting: Urology

## 2021-05-23 DIAGNOSIS — E785 Hyperlipidemia, unspecified: Secondary | ICD-10-CM | POA: Diagnosis not present

## 2021-05-23 DIAGNOSIS — E1169 Type 2 diabetes mellitus with other specified complication: Secondary | ICD-10-CM | POA: Diagnosis not present

## 2021-05-23 DIAGNOSIS — Z8546 Personal history of malignant neoplasm of prostate: Secondary | ICD-10-CM | POA: Diagnosis not present

## 2021-05-23 DIAGNOSIS — N1832 Chronic kidney disease, stage 3b: Secondary | ICD-10-CM | POA: Diagnosis not present

## 2021-05-23 DIAGNOSIS — E559 Vitamin D deficiency, unspecified: Secondary | ICD-10-CM | POA: Diagnosis not present

## 2021-05-29 ENCOUNTER — Ambulatory Visit: Payer: No Typology Code available for payment source | Admitting: Orthopaedic Surgery

## 2021-06-09 NOTE — Progress Notes (Addendum)
DUE TO COVID-19 ONLY  2 VISITOR IS ALLOWED TO COME WITH YOU AND STAY IN THE WAITING ROOM ONLY DURING PRE OP AND PROCEDURE DAY OF SURGERY.  4  VISITOR  MAY VISIT WITH YOU AFTER SURGERY IN YOUR PRIVATE ROOM DURING VISITING HOURS ONLY! ?YOU MAY HAVE ONE PERSON SPEND THE NITE WITH YOU IN YOUR ROOM AFTER SURGERY.   ? ? ? ? Your procedure is scheduled on:  ?         06/24/2021  ? Report to [facility] Main  Entrance ? ? Report to admitting at         0515         AM ?DO NOT [place], PICTURE ID OR WALLET DAY OF SURGERY.  ?  ? ? Call this number if you have problems the morning of surgery [phone]  ? ?CLEAR LIQUID DIET THE DAY BEFORE SURGERY.  ? ? REMEMBER: NO  SOLID FOODS , CANDY, GUM OR MINTS AFTER MIDNITE THE NITE BEFORE SURGERY .       Marland Kitchen CLEAR LIQUIDS UNTIL    0415am             DAY OF SURGERY.      ? ? ?CLEAR LIQUID DIET ? ? ?Foods Allowed      ?WATER ?BLACK COFFEE ( SUGAR OK, NO MILK, CREAM OR CREAMER) REGULAR AND DECAF  ?TEA ( SUGAR OK NO MILK, CREAM, OR CREAMER) REGULAR AND DECAF  ?PLAIN JELLO ( NO RED)  ?FRUIT ICES ( NO RED, NO FRUIT PULP)  ?POPSICLES ( NO RED)  ?JUICE- APPLE, WHITE GRAPE AND WHITE CRANBERRY  ?SPORT DRINK LIKE GATORADE ( NO RED)  ?CLEAR BROTH ( VEGETABLE , CHICKEN OR BEEF)                                                               ? ?    ? ?BRUSH YOUR TEETH MORNING OF SURGERY AND RINSE YOUR MOUTH OUT, NO CHEWING GUM CANDY OR MINTS. ?  ? ? Take these medicines the morning of surgery with A SIP OF WATER:  none  ? ? ?DO NOT TAKE ANY DIABETIC MEDICATIONS DAY OF YOUR SURGERY ?                  ?            You may not have any metal on your body including hair pins and  ?            piercings  Do not wear jewelry, make-up, lotions, powders or perfumes, deodorant ?            Do not wear nail polish on your fingernails.   ?           IF YOU ARE A MALE AND WANT TO SHAVE UNDER ARMS OR LEGS PRIOR TO SURGERY YOU MUST DO SO AT LEAST 48 HOURS PRIOR TO SURGERY.  ?            Men may  shave face and neck. ? ? Do not bring valuables to the hospital. [place] NOT ?            RESPONSIBLE   FOR VALUABLES. ? Contacts, dentures or bridgework may not be worn into  surgery. ? Leave suitcase in the car. After surgery it may be brought to your room. ? ?  ? Patients discharged the day of surgery will not be allowed to drive home. IF YOU ARE HAVING SURGERY AND GOING HOME THE SAME DAY, YOU MUST HAVE AN ADULT TO DRIVE YOU HOME AND BE WITH YOU FOR 24 HOURS. YOU MAY GO HOME BY TAXI OR UBER OR ORTHERWISE, BUT AN ADULT MUST ACCOMPANY YOU HOME AND STAY WITH YOU FOR 24 HOURS. ?  ? ?            Please read over the following fact sheets you were given: ?_____________________________________________________________________ ? ?[place] - Preparing for Surgery ?Before surgery, you can play an important role.  Because skin is not sterile, your skin needs to be as free of germs as possible.  You can reduce the number of germs on your skin by washing with CHG (chlorahexidine gluconate) soap before surgery.  CHG is an antiseptic cleaner which kills germs and bonds with the skin to continue killing germs even after washing. ?Please DO NOT use if you have an allergy to CHG or antibacterial soaps.  If your skin becomes reddened/irritated stop using the CHG and inform your nurse when you arrive at Short Stay. ?Do not shave (including legs and underarms) for at least 48 hours prior to the first CHG shower.  You may shave your face/neck. ?Please follow these instructions carefully: ? 1.  Shower with CHG Soap the night before surgery and the  morning of Surgery. ? 2.  If you choose to wash your hair, wash your hair first as usual with your  normal  shampoo. ? 3.  After you shampoo, rinse your hair and body thoroughly to remove the  shampoo.                           4.  Use CHG as you would any other liquid soap.  You can apply chg directly  to the skin and wash  ?                     Gently with a scrungie or clean  washcloth. ? 5.  Apply the CHG Soap to your body ONLY FROM THE NECK DOWN.   Do not use on face/ open      ?                     Wound or open sores. Avoid contact with eyes, ears mouth and genitals (private parts).  ?                     Production manager,  Genitals (private parts) with your normal soap. ?            6.  Wash thoroughly, paying special attention to the area where your surgery  will be performed. ? 7.  Thoroughly rinse your body with warm water from the neck down. ? 8.  DO NOT shower/wash with your normal soap after using and rinsing off  the CHG Soap. ?               9.  Pat yourself dry with a clean towel. ?           10.  Wear clean pajamas. ?           11.  Place clean sheets on your bed the night of  your first shower and do not  sleep with pets. ?Day of Surgery : ?Do not apply any lotions/deodorants the morning of surgery.  Please wear clean clothes to the hospital/surgery center. ? ?FAILURE TO FOLLOW THESE INSTRUCTIONS MAY RESULT IN THE CANCELLATION OF YOUR SURGERY ?PATIENT SIGNATURE_________________________________ ? ?NURSE SIGNATURE__________________________________ ? ?________________________________________________________________________  ? ? ?           ?

## 2021-06-09 NOTE — Progress Notes (Addendum)
Anesthesia Review: ? ?PCP: DR Stana Bunting at [facility] in [place]  ?Cardiologist : none  ?Chest x-ray : 05/03/21  ?03/21/21- CT Chest  ?EKG : 10/08/20  ?Echo : ?Stress test: ?Cardiac Cath :  ?Activity level: can do a flight of stairs without difficulty  ?Sleep Study/ CPAP :  has cpap  ?Fasting Blood Sugar :      / Checks Blood Sugar -- times a day:   ?Blood Thinner/ Instructions /Last Dose: ?ASA / Instructions/ Last Dose :   ?DM- type  2  ?Hgba1c- 06/11/21- 7.3  ?Checks glucose on occasion at hoime  ?BMp done 06/11/2021 rotued to Dr Lovena Neighbours.  ?

## 2021-06-11 ENCOUNTER — Other Ambulatory Visit: Payer: Self-pay

## 2021-06-11 ENCOUNTER — Encounter (HOSPITAL_COMMUNITY): Payer: Self-pay

## 2021-06-11 ENCOUNTER — Encounter (HOSPITAL_COMMUNITY)
Admission: RE | Admit: 2021-06-11 | Discharge: 2021-06-11 | Disposition: A | Discharge status: Home / Self Care | Payer: No Typology Code available for payment source | Source: Ambulatory Visit | Attending: Urology | Admitting: Urology

## 2021-06-11 VITALS — BP 141/65 | HR 65 | Temp 97.8°F | Resp 16 | Ht 68.0 in | Wt 217.0 lb

## 2021-06-11 DIAGNOSIS — E119 Type 2 diabetes mellitus without complications: Secondary | ICD-10-CM | POA: Insufficient documentation

## 2021-06-11 DIAGNOSIS — Z01812 Encounter for preprocedural laboratory examination: Secondary | ICD-10-CM | POA: Insufficient documentation

## 2021-06-11 DIAGNOSIS — Z01818 Encounter for other preprocedural examination: Secondary | ICD-10-CM

## 2021-06-11 HISTORY — DX: Pneumonia, unspecified organism: J18.9

## 2021-06-11 LAB — TYPE AND SCREEN
ABO/RH(D): O POS
Antibody Screen: NEGATIVE

## 2021-06-11 LAB — BASIC METABOLIC PANEL
Anion gap: 7 (ref 5–15)
BUN: 38 mg/dL — ABNORMAL HIGH (ref 8–23)
CO2: 24 mmol/L (ref 22–32)
Calcium: 9.8 mg/dL (ref 8.9–10.3)
Chloride: 110 mmol/L (ref 98–111)
Creatinine, Ser: 1.74 mg/dL — ABNORMAL HIGH (ref 0.61–1.24)
GFR, Estimated: 40 mL/min — ABNORMAL LOW (ref >60)
Glucose, Bld: 136 mg/dL — ABNORMAL HIGH (ref 70–99)
Potassium: 3.7 mmol/L (ref 3.5–5.1)
Sodium: 141 mmol/L (ref 135–145)

## 2021-06-11 LAB — CBC
HCT: 34.7 % — ABNORMAL LOW (ref 39.0–52.0)
Hemoglobin: 11.6 g/dL — ABNORMAL LOW (ref 13.0–17.0)
MCH: 32 pg (ref 26.0–34.0)
MCHC: 33.4 g/dL (ref 30.0–36.0)
MCV: 95.9 fL (ref 80.0–100.0)
Platelets: 246 10*3/uL (ref 150–400)
RBC: 3.62 MIL/uL — ABNORMAL LOW (ref 4.22–5.81)
RDW: 13.4 % (ref 11.5–15.5)
WBC: 8.5 10*3/uL (ref 4.0–10.5)
nRBC: 0 % (ref 0.0–0.2)

## 2021-06-11 LAB — HEMOGLOBIN A1C
Hgb A1c MFr Bld: 7.3 % — ABNORMAL HIGH (ref 4.8–5.6)
Mean Plasma Glucose: 162.81 mg/dL

## 2021-06-11 LAB — GLUCOSE, CAPILLARY: Glucose-Capillary: 124 mg/dL — ABNORMAL HIGH (ref 70–99)

## 2021-06-16 DIAGNOSIS — B351 Tinea unguium: Secondary | ICD-10-CM | POA: Diagnosis not present

## 2021-06-16 DIAGNOSIS — M2010 Hallux valgus (acquired), unspecified foot: Secondary | ICD-10-CM | POA: Diagnosis not present

## 2021-06-18 ENCOUNTER — Inpatient Hospital Stay (HOSPITAL_COMMUNITY): Payer: Medicare HMO

## 2021-06-23 NOTE — Anesthesia Preprocedure Evaluation (Addendum)
Anesthesia Evaluation  ?Patient identified by MRN, date of birth, ID band ?Patient awake ? ? ? ?Reviewed: ?Allergy & Precautions, NPO status , Patient's Chart, lab work & pertinent test results ? ?Airway ?Mallampati: II ? ?TM Distance: >3 FB ?Neck ROM: Full ? ? ? Dental ? ?(+) Partial Upper ?  ?Pulmonary ?sleep apnea and Continuous Positive Airway Pressure Ventilation , former smoker,  ?  ?Pulmonary exam normal ?breath sounds clear to auscultation ? ? ? ? ? ? Cardiovascular ?hypertension, Pt. on medications ?Normal cardiovascular exam ?Rhythm:Regular Rate:Normal ? ? ?  ?Neuro/Psych ?negative neurological ROS ? negative psych ROS  ? GI/Hepatic ?PUD,   ?Endo/Other  ?diabetes, Type 2obesity ? Renal/GU ?  ?negative genitourinary ?  ?Musculoskeletal ? ?(+) Arthritis , Osteoarthritis,   ? Abdominal ?  ?Peds ? Hematology ? ?(+) Blood dyscrasia, anemia ,   ?Anesthesia Other Findings ?Prostate cancer ? Reproductive/Obstetrics ?negative OB ROS ? ?  ? ? ? ? ? ? ? ? ? ? ? ? ? ?  ?  ? ? ? ? ? ? ? ?Anesthesia Physical ?Anesthesia Plan ? ?ASA: 3 ? ?Anesthesia Plan: General  ? ?Post-op Pain Management: Tylenol PO (pre-op)*  ? ?Induction: Intravenous ? ?PONV Risk Score and Plan: 2 and Treatment may vary due to age or medical condition, Ondansetron and Dexamethasone ? ?Airway Management Planned: Oral ETT ? ?Additional Equipment: None ? ?Intra-op Plan:  ? ?Post-operative Plan: Extubation in OR ? ?Informed Consent: I have reviewed the patients History and Physical, chart, labs and discussed the procedure including the risks, benefits and alternatives for the proposed anesthesia with the patient or authorized representative who has indicated his/her understanding and acceptance.  ? ? ? ?Dental advisory given ? ?Plan Discussed with: Anesthesiologist and CRNA ? ?Anesthesia Plan Comments: (GETA. 2 PIV. )  ? ? ? ? ? ?Anesthesia Quick Evaluation ? ?

## 2021-06-24 ENCOUNTER — Other Ambulatory Visit: Payer: Self-pay

## 2021-06-24 ENCOUNTER — Encounter (HOSPITAL_COMMUNITY): Payer: Self-pay | Admitting: Urology

## 2021-06-24 ENCOUNTER — Encounter (HOSPITAL_COMMUNITY)
Admission: RE | Disposition: A | Discharge status: Home / Self Care | Payer: Self-pay | Source: Home / Self Care | Attending: Urology

## 2021-06-24 ENCOUNTER — Observation Stay (HOSPITAL_COMMUNITY)
Admission: RE | Admit: 2021-06-24 | Discharge: 2021-06-26 | Disposition: A | Discharge status: Home / Self Care | Payer: No Typology Code available for payment source | Attending: Urology | Admitting: Urology

## 2021-06-24 ENCOUNTER — Ambulatory Visit (HOSPITAL_BASED_OUTPATIENT_CLINIC_OR_DEPARTMENT_OTHER): Payer: No Typology Code available for payment source | Admitting: Certified Registered Nurse Anesthetist

## 2021-06-24 ENCOUNTER — Ambulatory Visit (HOSPITAL_COMMUNITY): Payer: No Typology Code available for payment source | Admitting: Physician Assistant

## 2021-06-24 DIAGNOSIS — N2889 Other specified disorders of kidney and ureter: Secondary | ICD-10-CM | POA: Diagnosis not present

## 2021-06-24 DIAGNOSIS — Z8546 Personal history of malignant neoplasm of prostate: Secondary | ICD-10-CM | POA: Insufficient documentation

## 2021-06-24 DIAGNOSIS — I10 Essential (primary) hypertension: Secondary | ICD-10-CM | POA: Diagnosis not present

## 2021-06-24 DIAGNOSIS — Z79899 Other long term (current) drug therapy: Secondary | ICD-10-CM | POA: Insufficient documentation

## 2021-06-24 DIAGNOSIS — Z87891 Personal history of nicotine dependence: Secondary | ICD-10-CM | POA: Insufficient documentation

## 2021-06-24 DIAGNOSIS — G4733 Obstructive sleep apnea (adult) (pediatric): Secondary | ICD-10-CM | POA: Diagnosis not present

## 2021-06-24 DIAGNOSIS — N393 Stress incontinence (female) (male): Secondary | ICD-10-CM | POA: Diagnosis not present

## 2021-06-24 DIAGNOSIS — D49511 Neoplasm of unspecified behavior of right kidney: Secondary | ICD-10-CM | POA: Diagnosis not present

## 2021-06-24 DIAGNOSIS — E119 Type 2 diabetes mellitus without complications: Secondary | ICD-10-CM | POA: Diagnosis not present

## 2021-06-24 DIAGNOSIS — C641 Malignant neoplasm of right kidney, except renal pelvis: Secondary | ICD-10-CM | POA: Diagnosis not present

## 2021-06-24 HISTORY — PX: ROBOTIC ASSITED PARTIAL NEPHRECTOMY: SHX6087

## 2021-06-24 LAB — GLUCOSE, CAPILLARY
Glucose-Capillary: 174 mg/dL — ABNORMAL HIGH (ref 70–99)
Glucose-Capillary: 231 mg/dL — ABNORMAL HIGH (ref 70–99)
Glucose-Capillary: 290 mg/dL — ABNORMAL HIGH (ref 70–99)
Glucose-Capillary: 253 mg/dL — ABNORMAL HIGH (ref 70–99)

## 2021-06-24 LAB — HEMOGLOBIN AND HEMATOCRIT, BLOOD
HCT: 33 % — ABNORMAL LOW (ref 39.0–52.0)
Hemoglobin: 10.6 g/dL — ABNORMAL LOW (ref 13.0–17.0)

## 2021-06-24 SURGERY — NEPHRECTOMY, PARTIAL, ROBOT-ASSISTED
Anesthesia: General | Site: Renal | Laterality: Right

## 2021-06-24 MED ORDER — BRINZOLAMIDE 1 % OP SUSP
1.0000 [drp] | Freq: Three times a day (TID) | OPHTHALMIC | Status: DC
  Administered 2021-06-24 – 2021-06-25 (×5): 1 [drp] via OPHTHALMIC
  Filled 2021-06-24: qty 10

## 2021-06-24 MED ORDER — BUPIVACAINE LIPOSOME 1.3 % IJ SUSP
INTRAMUSCULAR | Status: AC
  Filled 2021-06-24: qty 20

## 2021-06-24 MED ORDER — ROCURONIUM BROMIDE 10 MG/ML (PF) SYRINGE
PREFILLED_SYRINGE | INTRAVENOUS | Status: AC
  Filled 2021-06-24: qty 10

## 2021-06-24 MED ORDER — DEXAMETHASONE SODIUM PHOSPHATE 10 MG/ML IJ SOLN
INTRAMUSCULAR | Status: AC
  Filled 2021-06-24: qty 1

## 2021-06-24 MED ORDER — PHENYLEPHRINE 80 MCG/ML (10ML) SYRINGE FOR IV PUSH (FOR BLOOD PRESSURE SUPPORT)
PREFILLED_SYRINGE | INTRAVENOUS | Status: DC | PRN
  Administered 2021-06-24: 80 ug via INTRAVENOUS
  Administered 2021-06-24: 40 ug via INTRAVENOUS
  Administered 2021-06-24 (×2): 80 ug via INTRAVENOUS

## 2021-06-24 MED ORDER — PHENYLEPHRINE HCL-NACL 20-0.9 MG/250ML-% IV SOLN
INTRAVENOUS | Status: AC
  Filled 2021-06-24: qty 250

## 2021-06-24 MED ORDER — DEXAMETHASONE SODIUM PHOSPHATE 10 MG/ML IJ SOLN
INTRAMUSCULAR | Status: DC | PRN
  Administered 2021-06-24: 4 mg via INTRAVENOUS

## 2021-06-24 MED ORDER — PROPOFOL 10 MG/ML IV BOLUS
INTRAVENOUS | Status: AC
  Filled 2021-06-24: qty 20

## 2021-06-24 MED ORDER — LIDOCAINE 2% (20 MG/ML) 5 ML SYRINGE
INTRAMUSCULAR | Status: DC | PRN
  Administered 2021-06-24: 80 mg via INTRAVENOUS

## 2021-06-24 MED ORDER — LACTATED RINGERS IV SOLN: INTRAVENOUS | Status: DC

## 2021-06-24 MED ORDER — EPHEDRINE SULFATE-NACL 50-0.9 MG/10ML-% IV SOSY
PREFILLED_SYRINGE | INTRAVENOUS | Status: DC | PRN
  Administered 2021-06-24: 15 mg via INTRAVENOUS
  Administered 2021-06-24 (×2): 10 mg via INTRAVENOUS
  Administered 2021-06-24: 15 mg via INTRAVENOUS

## 2021-06-24 MED ORDER — SODIUM CHLORIDE 0.9 % IV SOLN
INTRAVENOUS | Status: DC | PRN
  Administered 2021-06-24: 40 mL

## 2021-06-24 MED ORDER — FENTANYL CITRATE (PF) 100 MCG/2ML IJ SOLN
INTRAMUSCULAR | Status: AC
  Filled 2021-06-24: qty 2

## 2021-06-24 MED ORDER — CEFAZOLIN SODIUM-DEXTROSE 1-4 GM/50ML-% IV SOLN
1.0000 g | Freq: Three times a day (TID) | INTRAVENOUS | Status: AC
  Administered 2021-06-24 (×2): 1 g via INTRAVENOUS
  Filled 2021-06-24 (×2): qty 50

## 2021-06-24 MED ORDER — STERILE WATER FOR IRRIGATION IR SOLN
Status: DC | PRN
Start: 2021-06-24 — End: 2021-06-24
  Administered 2021-06-24: 1000 mL

## 2021-06-24 MED ORDER — CEFAZOLIN SODIUM-DEXTROSE 2-4 GM/100ML-% IV SOLN
2.0000 g | Freq: Once | INTRAVENOUS | Status: AC
  Administered 2021-06-24: 2 g via INTRAVENOUS
  Filled 2021-06-24: qty 100

## 2021-06-24 MED ORDER — LACTATED RINGERS IR SOLN
Status: DC | PRN
  Administered 2021-06-24: 1000 mL

## 2021-06-24 MED ORDER — SODIUM CHLORIDE 0.9 % IV SOLN: INTRAVENOUS | Status: DC

## 2021-06-24 MED ORDER — PHENYLEPHRINE HCL-NACL 20-0.9 MG/250ML-% IV SOLN
INTRAVENOUS | Status: DC | PRN
  Administered 2021-06-24: 20 ug/min via INTRAVENOUS

## 2021-06-24 MED ORDER — LATANOPROST 0.005 % OP SOLN
1.0000 [drp] | Freq: Every day | OPHTHALMIC | Status: DC
  Administered 2021-06-24 – 2021-06-25 (×2): 1 [drp] via OPHTHALMIC
  Filled 2021-06-24: qty 2.5

## 2021-06-24 MED ORDER — ONDANSETRON HCL 4 MG/2ML IJ SOLN: 4.0000 mg | Freq: Once | INTRAMUSCULAR | Status: DC | PRN

## 2021-06-24 MED ORDER — HYDROCODONE-ACETAMINOPHEN 5-325 MG PO TABS: 1.0000 | ORAL_TABLET | Freq: Four times a day (QID) | ORAL | 0 refills | Status: DC | PRN

## 2021-06-24 MED ORDER — PHENYLEPHRINE 80 MCG/ML (10ML) SYRINGE FOR IV PUSH (FOR BLOOD PRESSURE SUPPORT)
PREFILLED_SYRINGE | INTRAVENOUS | Status: AC
  Filled 2021-06-24: qty 10

## 2021-06-24 MED ORDER — DIPHENHYDRAMINE HCL 12.5 MG/5ML PO ELIX: 12.5000 mg | ORAL_SOLUTION | Freq: Four times a day (QID) | ORAL | Status: DC | PRN

## 2021-06-24 MED ORDER — HYDROMORPHONE HCL 1 MG/ML IJ SOLN: 0.5000 mg | INTRAMUSCULAR | Status: DC | PRN

## 2021-06-24 MED ORDER — AMISULPRIDE (ANTIEMETIC) 5 MG/2ML IV SOLN: 10.0000 mg | Freq: Once | INTRAVENOUS | Status: DC | PRN

## 2021-06-24 MED ORDER — HEMOSTATIC AGENTS (NO CHARGE) OPTIME
TOPICAL | Status: DC | PRN
  Administered 2021-06-24: 1 via TOPICAL

## 2021-06-24 MED ORDER — OXYCODONE HCL 5 MG/5ML PO SOLN: 5.0000 mg | Freq: Once | ORAL | Status: DC | PRN

## 2021-06-24 MED ORDER — PROMETHAZINE HCL 12.5 MG PO TABS
12.5000 mg | ORAL_TABLET | ORAL | 0 refills | Status: DC | PRN
Start: 2021-06-24 — End: 2022-04-22

## 2021-06-24 MED ORDER — BRIMONIDINE TARTRATE 0.2 % OP SOLN
1.0000 [drp] | Freq: Three times a day (TID) | OPHTHALMIC | Status: DC
  Administered 2021-06-24 – 2021-06-25 (×5): 1 [drp] via OPHTHALMIC
  Filled 2021-06-24: qty 5

## 2021-06-24 MED ORDER — PROPOFOL 10 MG/ML IV BOLUS
INTRAVENOUS | Status: DC | PRN
  Administered 2021-06-24: 150 mg via INTRAVENOUS

## 2021-06-24 MED ORDER — OXYCODONE HCL 5 MG PO TABS
5.0000 mg | ORAL_TABLET | ORAL | Status: DC | PRN
  Administered 2021-06-24 – 2021-06-25 (×3): 5 mg via ORAL
  Filled 2021-06-24 (×3): qty 1

## 2021-06-24 MED ORDER — INSULIN ASPART 100 UNIT/ML IJ SOLN
0.0000 [IU] | Freq: Three times a day (TID) | INTRAMUSCULAR | Status: DC
  Administered 2021-06-24: 8 [IU] via SUBCUTANEOUS
  Administered 2021-06-25 – 2021-06-26 (×4): 3 [IU] via SUBCUTANEOUS

## 2021-06-24 MED ORDER — DOCUSATE SODIUM 100 MG PO CAPS
100.0000 mg | ORAL_CAPSULE | Freq: Two times a day (BID) | ORAL | Status: DC
  Administered 2021-06-24 – 2021-06-26 (×4): 100 mg via ORAL
  Filled 2021-06-24 (×4): qty 1

## 2021-06-24 MED ORDER — DOCUSATE SODIUM 100 MG PO CAPS: 100.0000 mg | ORAL_CAPSULE | Freq: Two times a day (BID) | ORAL | Status: DC

## 2021-06-24 MED ORDER — ROCURONIUM BROMIDE 10 MG/ML (PF) SYRINGE
PREFILLED_SYRINGE | INTRAVENOUS | Status: DC | PRN
  Administered 2021-06-24: 30 mg via INTRAVENOUS
  Administered 2021-06-24: 70 mg via INTRAVENOUS
  Administered 2021-06-24: 30 mg via INTRAVENOUS

## 2021-06-24 MED ORDER — FENTANYL CITRATE (PF) 100 MCG/2ML IJ SOLN
INTRAMUSCULAR | Status: DC | PRN
  Administered 2021-06-24 (×2): 50 ug via INTRAVENOUS
  Administered 2021-06-24: 25 ug via INTRAVENOUS

## 2021-06-24 MED ORDER — SUGAMMADEX SODIUM 500 MG/5ML IV SOLN
INTRAVENOUS | Status: DC | PRN
  Administered 2021-06-24: 300 mg via INTRAVENOUS

## 2021-06-24 MED ORDER — "VISTASEAL 4 ML SINGLE DOSE KIT "
4.0000 mL | PACK | Freq: Once | CUTANEOUS | Status: AC
  Administered 2021-06-24: 4 mL via TOPICAL
  Filled 2021-06-24: qty 4

## 2021-06-24 MED ORDER — SODIUM CHLORIDE (PF) 0.9 % IJ SOLN
INTRAMUSCULAR | Status: AC
  Filled 2021-06-24: qty 20

## 2021-06-24 MED ORDER — ONDANSETRON HCL 4 MG/2ML IJ SOLN
INTRAMUSCULAR | Status: DC | PRN
  Administered 2021-06-24: 4 mg via INTRAVENOUS

## 2021-06-24 MED ORDER — EPHEDRINE 5 MG/ML INJ
INTRAVENOUS | Status: AC
  Filled 2021-06-24: qty 10

## 2021-06-24 MED ORDER — LIDOCAINE HCL (PF) 2 % IJ SOLN
INTRAMUSCULAR | Status: AC
  Filled 2021-06-24: qty 5

## 2021-06-24 MED ORDER — ORAL CARE MOUTH RINSE: 15.0000 mL | Freq: Once | OROMUCOSAL | Status: AC

## 2021-06-24 MED ORDER — CHLORTHALIDONE 25 MG PO TABS
25.0000 mg | ORAL_TABLET | Freq: Every day | ORAL | Status: DC
  Administered 2021-06-25 – 2021-06-26 (×2): 25 mg via ORAL
  Filled 2021-06-24 (×2): qty 1

## 2021-06-24 MED ORDER — BACITRACIN-NEOMYCIN-POLYMYXIN 400-5-5000 EX OINT: 1.0000 "application " | TOPICAL_OINTMENT | Freq: Three times a day (TID) | CUTANEOUS | Status: DC | PRN

## 2021-06-24 MED ORDER — LACTATED RINGERS IV SOLN: INTRAVENOUS | Status: DC | PRN

## 2021-06-24 MED ORDER — OXYCODONE HCL 5 MG PO TABS: 5.0000 mg | ORAL_TABLET | Freq: Once | ORAL | Status: DC | PRN

## 2021-06-24 MED ORDER — ACETAMINOPHEN 500 MG PO TABS
1000.0000 mg | ORAL_TABLET | Freq: Once | ORAL | Status: AC
  Administered 2021-06-24: 1000 mg via ORAL
  Filled 2021-06-24: qty 2

## 2021-06-24 MED ORDER — ONDANSETRON HCL 4 MG/2ML IJ SOLN
INTRAMUSCULAR | Status: AC
  Filled 2021-06-24: qty 2

## 2021-06-24 MED ORDER — DIPHENHYDRAMINE HCL 50 MG/ML IJ SOLN: 12.5000 mg | Freq: Four times a day (QID) | INTRAMUSCULAR | Status: DC | PRN

## 2021-06-24 MED ORDER — CHLORHEXIDINE GLUCONATE 0.12 % MT SOLN
15.0000 mL | Freq: Once | OROMUCOSAL | Status: AC
  Administered 2021-06-24: 15 mL via OROMUCOSAL

## 2021-06-24 MED ORDER — HYOSCYAMINE SULFATE 0.125 MG SL SUBL
0.1250 mg | SUBLINGUAL_TABLET | SUBLINGUAL | Status: DC | PRN
  Filled 2021-06-24: qty 1

## 2021-06-24 MED ORDER — ATORVASTATIN CALCIUM 10 MG PO TABS
10.0000 mg | ORAL_TABLET | Freq: Every day | ORAL | Status: DC
  Administered 2021-06-24 – 2021-06-25 (×2): 10 mg via ORAL
  Filled 2021-06-24 (×2): qty 1

## 2021-06-24 MED ORDER — ONDANSETRON HCL 4 MG/2ML IJ SOLN: 4.0000 mg | INTRAMUSCULAR | Status: DC | PRN

## 2021-06-24 MED ORDER — FENTANYL CITRATE PF 50 MCG/ML IJ SOSY: 25.0000 ug | PREFILLED_SYRINGE | INTRAMUSCULAR | Status: DC | PRN

## 2021-06-24 MED ORDER — ACETAMINOPHEN 500 MG PO TABS
1000.0000 mg | ORAL_TABLET | Freq: Four times a day (QID) | ORAL | Status: AC
  Administered 2021-06-24 – 2021-06-25 (×4): 1000 mg via ORAL
  Filled 2021-06-24 (×4): qty 2

## 2021-06-24 SURGICAL SUPPLY — 77 items
APPLICATOR VISTASEAL 35 (MISCELLANEOUS) ×2 IMPLANT
BAG COUNTER SPONGE SURGICOUNT (BAG) IMPLANT
BAG RETRIEVAL 10 (BASKET) ×1
CHLORAPREP W/TINT 26 (MISCELLANEOUS) ×2 IMPLANT
CLIP LIGATING HEM O LOK PURPLE (MISCELLANEOUS) ×3 IMPLANT
CLIP LIGATING HEMO LOK XL GOLD (MISCELLANEOUS) ×1 IMPLANT
CLIP LIGATING HEMO O LOK GREEN (MISCELLANEOUS) ×4 IMPLANT
CLIP SUT LAPRA TY ABSORB (SUTURE) ×5 IMPLANT
COVER SURGICAL LIGHT HANDLE (MISCELLANEOUS) ×2 IMPLANT
COVER TIP SHEARS 8 DVNC (MISCELLANEOUS) ×1 IMPLANT
COVER TIP SHEARS 8MM DA VINCI (MISCELLANEOUS) ×1
CUTTER ECHEON FLEX ENDO 45 340 (ENDOMECHANICALS) IMPLANT
DERMABOND ADVANCED (GAUZE/BANDAGES/DRESSINGS) ×1
DERMABOND ADVANCED .7 DNX12 (GAUZE/BANDAGES/DRESSINGS) ×1 IMPLANT
DRAIN CHANNEL 15F RND FF 3/16 (WOUND CARE) IMPLANT
DRAPE ARM DVNC X/XI (DISPOSABLE) ×4 IMPLANT
DRAPE COLUMN DVNC XI (DISPOSABLE) ×1 IMPLANT
DRAPE DA VINCI XI ARM (DISPOSABLE) ×4
DRAPE DA VINCI XI COLUMN (DISPOSABLE) ×1
DRAPE INCISE IOBAN 66X45 STRL (DRAPES) ×2 IMPLANT
DRAPE SHEET LG 3/4 BI-LAMINATE (DRAPES) ×2 IMPLANT
ELECT PENCIL ROCKER SW 15FT (MISCELLANEOUS) ×2 IMPLANT
ELECT REM PT RETURN 15FT ADLT (MISCELLANEOUS) ×2 IMPLANT
EVACUATOR SILICONE 100CC (DRAIN) IMPLANT
GAUZE 4X4 16PLY ~~LOC~~+RFID DBL (SPONGE) ×2 IMPLANT
GLOVE BIO SURGEON STRL SZ 6.5 (GLOVE) ×2 IMPLANT
GLOVE BIOGEL PI IND STRL 8 (GLOVE) ×2 IMPLANT
GLOVE BIOGEL PI INDICATOR 8 (GLOVE) ×2
GLOVE SURG LX 7.5 STRW (GLOVE) ×2
GLOVE SURG LX STRL 7.5 STRW (GLOVE) ×2 IMPLANT
GOWN STRL REUS W/ TWL LRG LVL3 (GOWN DISPOSABLE) ×1 IMPLANT
GOWN STRL REUS W/ TWL XL LVL3 (GOWN DISPOSABLE) ×2 IMPLANT
GOWN STRL REUS W/TWL LRG LVL3 (GOWN DISPOSABLE) ×1
GOWN STRL REUS W/TWL XL LVL3 (GOWN DISPOSABLE) ×2
HEMOSTAT SURGICEL 4X8 (HEMOSTASIS) ×2 IMPLANT
HOLDER FOLEY CATH W/STRAP (MISCELLANEOUS) ×2 IMPLANT
IRRIG SUCT STRYKERFLOW 2 WTIP (MISCELLANEOUS) ×2
IRRIGATION SUCT STRKRFLW 2 WTP (MISCELLANEOUS) ×1 IMPLANT
IV LACTATED RINGERS 1000ML (IV SOLUTION) ×1 IMPLANT
KIT BASIN OR (CUSTOM PROCEDURE TRAY) ×2 IMPLANT
KIT TURNOVER KIT A (KITS) IMPLANT
LOOP VESSEL MAXI BLUE (MISCELLANEOUS) ×1 IMPLANT
MARKER SKIN DUAL TIP RULER LAB (MISCELLANEOUS) ×2 IMPLANT
NDL INSUFFLATION 14GA 120MM (NEEDLE) ×1 IMPLANT
NEEDLE INSUFFLATION 14GA 120MM (NEEDLE) ×2 IMPLANT
PROTECTOR NERVE ULNAR (MISCELLANEOUS) ×4 IMPLANT
RELOAD STAPLE 45 2.6 WHT THIN (STAPLE) IMPLANT
SCISSORS LAP 5X45 EPIX DISP (ENDOMECHANICALS) ×2 IMPLANT
SEAL CANN UNIV 5-8 DVNC XI (MISCELLANEOUS) ×4 IMPLANT
SEAL XI 5MM-8MM UNIVERSAL (MISCELLANEOUS) ×4
SEALANT SURGICAL APPL DUAL CAN (MISCELLANEOUS) IMPLANT
SET TUBE SMOKE EVAC HIGH FLOW (TUBING) ×2 IMPLANT
SOLUTION ELECTROLUBE (MISCELLANEOUS) ×2 IMPLANT
SPIKE FLUID TRANSFER (MISCELLANEOUS) ×1 IMPLANT
STAPLE RELOAD 45 WHT (STAPLE) IMPLANT
STAPLE RELOAD 45MM WHITE (STAPLE)
SUT ETHILON 2 0 PSLX (SUTURE) IMPLANT
SUT MNCRL AB 4-0 PS2 18 (SUTURE) ×4 IMPLANT
SUT PDS AB 0 CT1 36 (SUTURE) IMPLANT
SUT V-LOC BARB 180 2/0GR6 GS22 (SUTURE)
SUT VIC AB 1 CT1 36 (SUTURE) ×8 IMPLANT
SUT VIC AB 4-0 SH 27 (SUTURE) ×1
SUT VIC AB 4-0 SH 27XBRD (SUTURE) IMPLANT
SUT VICRYL 0 UR6 27IN ABS (SUTURE) IMPLANT
SUT VLOC BARB 180 ABS3/0GR12 (SUTURE) ×4
SUTURE V-LC BRB 180 2/0GR6GS22 (SUTURE) IMPLANT
SUTURE VLOC BRB 180 ABS3/0GR12 (SUTURE) ×2 IMPLANT
SYS BAG RETRIEVAL 10MM (BASKET) ×1
SYSTEM BAG RETRIEVAL 10MM (BASKET) ×1 IMPLANT
TOWEL OR 17X26 10 PK STRL BLUE (TOWEL DISPOSABLE) ×2 IMPLANT
TOWEL OR NON WOVEN STRL DISP B (DISPOSABLE) ×1 IMPLANT
TRAY FOLEY MTR SLVR 16FR STAT (SET/KITS/TRAYS/PACK) ×2 IMPLANT
TRAY LAPAROSCOPIC (CUSTOM PROCEDURE TRAY) ×2 IMPLANT
TROCAR BLADELESS OPT 5 100 (ENDOMECHANICALS) IMPLANT
TROCAR UNIVERSAL OPT 12M 100M (ENDOMECHANICALS) IMPLANT
TROCAR XCEL 12X100 BLDLESS (ENDOMECHANICALS) ×2 IMPLANT
WATER STERILE IRR 1000ML POUR (IV SOLUTION) ×2 IMPLANT

## 2021-06-24 NOTE — Transfer of Care (Signed)
Immediate Anesthesia Transfer of Care Note ? ?Patient: Todd Gates ? ?Procedure(s) Performed: XI ROBOTIC ASSITED PARTIAL NEPHRECTOMY (Right: Renal) ? ?Patient Location: PACU ? ?Anesthesia Type:General ? ?Level of Consciousness: drowsy and patient cooperative ? ?Airway & Oxygen Therapy: Patient Spontanous Breathing and Patient connected to face mask oxygen ? ?Post-op Assessment: Report given to RN and Post -op Vital signs reviewed and stable ? ?Post vital signs: Reviewed and stable ? ?Last Vitals:  ?Vitals Value Taken Time  ?BP 147/63 06/24/21 1018  ?Temp    ?Pulse 56 06/24/21 1020  ?Resp 6 06/24/21 1020  ?SpO2 100 % 06/24/21 1020  ?Vitals shown include unvalidated device data. ? ?Last Pain:  ?Vitals:  ? 06/24/21 0612  ?TempSrc:   ?PainSc: 0-No pain  ?   ? ?  ? ?Complications: No notable events documented. ?

## 2021-06-24 NOTE — Discharge Instructions (Signed)

## 2021-06-24 NOTE — H&P (Signed)
Office Visit Report     06/17/2021  ? ?-------------------------------------------------------------------------------- ?  ?Todd Gates  ?MRN: [id number]  ?DOB: 1945-07-06, 76 year old Male  ? PRIMARY CARE:  Glenda Chroman, MD  ?REFERRINGGlena Norfolk. Lovena Neighbours, MD  ?PROVIDER:  Link Snuffer, III, M.D.  ?TREATING:  Jiles Crocker, NP  ?LOCATION:  [facility] [phone]  ?  ? ?-------------------------------------------------------------------------------- ?  ?CC/HPI: Right renal mass  ? ?Todd Gates is a 76 year old male who was found to have a 2.5 cm cystic lesion involving the right kidney with features concerning for renal cell carcinoma. He has a significant past medical history of grade 3 prostate cancer, status post robotic prostatectomy in 2009 with subsequent PSA recurrence requiring salvage radiation therapy. He [place] His last PSA was undetectable.  ? ?-His right renal mass was initially discovered on renal ultrasound during a work-up for chronic kidney disease. It was then further characterized by MRI in December 2022 and then again on CT chest, abdomen and pelvis with and without on 03/21/2021. The mass was not as well visualized on his CT, but still has features concerning for a cystic neoplasm. No evidence of metastatic disease was seen on either CT or MRI. His baseline serum creatinine has been around 1.5.  ? ? ?06/17/2021: Here today for preoperative appointment prior to undergoing right robotic partial nephrectomy on 5/2 with Dr. WLovena Neighbours Patient continues to do well. He denies any changes in past medical history, prescription medications taken on a daily basis, no interval surgical or procedural intervention. Denies any right-sided lower back or flank pain/discomfort. Continues to void at his baseline with stable symptomology. He does have some stress urinary incontinence but this is managed with protective undergarments. He has had  no interval dysuria, gross hematuria, interval treatment for UTI. He denies any recent chest pain, shortness of breath, fever/chills, nausea/vomiting.  ? ?  ?ALLERGIES: Norvasc TABS ?  ? ?MEDICATIONS: Lisinopril 40 mg tablet  ?Atorvastatin Calcium 40 mg tablet  ?Calcium  ?Chlorthalidone 25 mg tablet  ?Curamin  ?Febuxostat '40Mg'$   ?Metformin Er Gastric  ?  ? ?GU PSH: Robotic Radical Prostatectomy - 2009 ? ?  ?   ?PSH Notes: Prostatect Retropubic Radical W/ Nerve Sparing Laparoscopic, Reported Hx Of Surgery For An Ulcer  ? ?NON-GU PSH: Hip Arthroscopy/surgery, Left ? ?  ? ?GU PMH: Prostate Cancer - 05/02/2021, - 03/27/2021, - 11/21/2019, Prostate cancer, - 2016 ?Right renal neoplasm - 05/02/2021, - 03/27/2021 ?Stress Incontinence - 03/27/2021, - 11/21/2019, - 2017 ?Rising PSA after prostate cancer treatment - 2019 ?ED following radical prostatectomy - 2017 ?History of prostate cancer - 2017 ?Inflammatory Disease Prostate, Unspec, Prostatitis - 2014 ?Prostate nodule w/o LUTS, Nodular prostate without lower urinary tract symptoms - 2014 ?  ?   ?PMH Notes:  ?2007-03-08 08:24:37 - Note: Peptic Ulcer  ? ?NON-GU PMH: Encounter for general adult medical examination without abnormal findings, Encounter for preventive health examination - 2015 ?Muscle weakness (generalized), Muscle weakness - 2014 ?Personal history of other diseases of the circulatory system, History of hypertension - 2014 ?Personal history of other diseases of the nervous system and sense organs, History of glaucoma - 2014 ?  ? ?FAMILY HISTORY: Brain Cancer - Father ?Death In The Family Father - Father ?Family Health Status Number - Runs In Family  ? ?SOCIAL HISTORY: Marital Status: Married ?Preferred Language: [place] Race: Black or African American ?Current Smoking Status: Patient does not smoke anymore. Has not smoked  since 08/23/1985.  ?Does not drink anymore.  ?Drinks 1 caffeinated drink per day. ?Patient's occupation is/was Retired. ?  ?  Notes: Living Independently  With Spouse, Self-reliant In Usual Daily Activities, Activities Of Daily Living, Exercise Habits, Alcohol Use, Previous History Of Smoking, Caffeine Use, Unemployed  ? ?REVIEW OF SYSTEMS:    ?GU Review Male:   Patient reports get up at night to urinate and leakage of urine. Patient denies frequent urination, hard to postpone urination, burning/ pain with urination, stream starts and stops, trouble starting your stream, have to strain to urinate , erection problems, and penile pain.  ?Gastrointestinal (Upper):   Patient denies nausea, vomiting, and indigestion/ heartburn.  ?Gastrointestinal (Lower):   Patient denies constipation and diarrhea.  ?Constitutional:   Patient denies fever, night sweats, weight loss, and fatigue.  ?Skin:   Patient denies skin rash/ lesion and itching.  ?Eyes:   Patient denies blurred vision and double vision.  ?Ears/ Nose/ Throat:   Patient denies sore throat and sinus problems.  ?Hematologic/Lymphatic:   Patient denies swollen glands and easy bruising.  ?Cardiovascular:   Patient denies leg swelling and chest pains.  ?Respiratory:   Patient denies cough and shortness of breath.  ?Endocrine:   Patient denies excessive thirst.  ?Musculoskeletal:   Patient denies back pain and joint pain.  ?Neurological:   Patient denies headaches and dizziness.  ?Psychologic:   Patient denies depression and anxiety.  ? ?VITAL SIGNS:    ?  06/17/2021 11:14 AM  ?Weight 211 lb / 95.71 kg  ?Height 68 in / 172.72 cm  ?BP 162/71 mmHg  ?Heart Rate 60 /min  ?Temperature 99.1 F / 37.2 C  ?BMI 32.1 kg/m?  ? ?MULTI-SYSTEM PHYSICAL EXAMINATION:    ?Constitutional: Well-nourished. No physical deformities. Normally developed. Good grooming.  ?Neck: Neck symmetrical, not swollen. Normal tracheal position.  ?Respiratory: No labored breathing, no use of accessory muscles. Normal breath sounds.  ?Cardiovascular: Normal temperature, normal extremity pulses, no swelling, no varicosities.  ?Skin: No paleness, no jaundice, no  cyanosis. No lesion, no ulcer, no rash.  ?Neurologic / Psychiatric: Oriented to time, oriented to place, oriented to person. No depression, no anxiety, no agitation.  ?Gastrointestinal: Obese abdomen. No mass, no tenderness, no rigidity. No flank or CVA tenderness.  ?Musculoskeletal: Normal gait and station of head and neck.  ? ?  ?Complexity of Data: CLINICAL DATA: Right renal mass on recent ultrasound. Previous  ?prostatectomy and radiation therapy for prostate carcinoma.  ? ?EXAM:  ?CT CHEST WITHOUT CONTRAST  ? ?CT ABDOMEN AND PELVIS WITH AND WITHOUT CONTRAST  ? ?TECHNIQUE:  ?Multidetector CT imaging of the chest was performed prior to  ?intravenous contrast administration. Multidetector CT imaging of the  ?abdomen and pelvis was subsequently performed following the standard  ?protocol before and during bolus administration of intravenous  ?contrast.  ? ?RADIATION DOSE REDUCTION: This exam was performed according to the  ?departmental dose-optimization program which includes automated  ?exposure control, adjustment of the mA and/or kV according to  ?patient size and/or use of iterative reconstruction technique.  ? ?CONTRAST: 65m OMNIPAQUE IOHEXOL 300 MG/ML SOLN  ? ?COMPARISON: Abdomen MRI on 02/18/2021  ? ?FINDINGS:  ?CT CHEST FINDINGS  ? ?Cardiovascular: No acute findings. Aortic and coronary  ?atherosclerotic calcification noted.  ? ?Mediastinum/Nodes: No masses or pathologically enlarged lymph nodes  ?identified on this unenhanced exam.  ? ?Lungs/Pleura: No suspicious nodules or masses identified. No  ?evidence of infiltrate or pleural effusion.  ? ?Musculoskeletal: No suspicious bone lesions.  ? ?  CT ABDOMEN AND PELVIS FINDINGS  ? ?Hepatobiliary: A few small hepatic cysts are stable. No hepatic  ?masses identified. Gallbladder is unremarkable. No evidence of  ?biliary ductal dilatation.  ? ?Pancreas: No mass or inflammatory changes.  ? ?Spleen: Within normal limits in size and appearance.  ? ?Adrenals/Urinary  Tract: Normal appearance the adrenal glands.  ?Probable tiny sub-cm cyst in midpole of left kidney remains stable.  ?A 1.1 cm subcapsular Bosniak category 2 cyst in the lateral midpole  ?of the right k

## 2021-06-24 NOTE — Anesthesia Procedure Notes (Signed)
Procedure Name: Intubation ?Date/Time: 06/24/2021 7:26 AM ?Performed by: West Pugh, CRNA ?Pre-anesthesia Checklist: Patient identified, Emergency Drugs available, Suction available, Patient being monitored and Timeout performed ?Patient Re-evaluated:Patient Re-evaluated prior to induction ?Oxygen Delivery Method: Circle system utilized ?Preoxygenation: Pre-oxygenation with 100% oxygen ?Induction Type: IV induction ?Ventilation: Mask ventilation without difficulty and Oral airway inserted - appropriate to patient size ?Laryngoscope Size: Mac and 4 ?Grade View: Grade II ?Tube type: Oral ?Tube size: 7.5 mm ?Number of attempts: 1 ?Airway Equipment and Method: Stylet ?Placement Confirmation: ETT inserted through vocal cords under direct vision, positive ETCO2, CO2 detector and breath sounds checked- equal and bilateral ?Secured at: 22 cm ?Tube secured with: Tape ?Dental Injury: Teeth and Oropharynx as per pre-operative assessment  ? ? ? ? ?

## 2021-06-24 NOTE — Op Note (Signed)
Operative Note ? ?Preoperative diagnosis:  ?1.  2.5 cm cystic right renal mass ? ?Postoperative diagnosis: ?1.  2.5 cm cystic right renal mass ? ?Procedure(s): ?1.  Robot-assisted laparoscopic right partial nephrectomy ?2.  Intraoperative ultrasound of single retroperitoneal organ ? ?Surgeon: Ellison Hughs, MD ? ?Assistants: Debbrah Alar, PA-C An assistant was required for this surgical procedure.  The duties of the assistant included but were not limited to suctioning, passing suture, camera manipulation, retraction.  This procedure would not be able to be performed without an Environmental consultant.  ? ?Anesthesia:  General ? ?Complications:  None ? ?EBL: 150 mL ? ?Specimens: ?1.  Right renal mass ? ?Drains/Catheters: ?1.  Foley catheter ? ?Intraoperative findings:   ?Grossly negative surgical margins following excision of right renal mass ?The renorrhaphy was hemostatic at the conclusion of the case ? ?Indication:  Todd Gates is a 76 y.o. male with solid and cystic right renal mass with features concerning for renal cell carcinoma.  He has been consented for the above procedures, voices understanding and wishes to proceed. ? ?Description of procedure: ? ?After informed consent was signed, the patient was taken back to the operating room and properly anesthetized.  The patient was then placed in the left lateral decubitus position with all pressure points padded.  The abdomen was then prepped and draped in the usual sterile fashion.  A time-out was then performed.   ? ?An 8 mm incision was then made lateral to the right rectus muscle at the level of the right 12th rib.  A Veress needle was then used to access the abdominal cavity.  A saline drop test showed no signs of obstruction and aspiration of the Veress needle revealed no blood or sucus.  The abdominal cavity was then insufflated to 15 mmHg.  An 8 mm robotic trocar was then atraumatically inserted into the abdominal cavity.  The robotic camera was then inserted  through the port and inspection of the abdominal cavity revealed no evidence of adjacent organ or vessel injury.  We then placed three additional 8 mm robotic ports and a 15 mm assistant port in such as fashion as to triangulate the right renal hilum.  The robot was then docked into postion. ? ?The white line of Toldt along the ascending colon was then incised, allowing [place] to reflect the colon medially and expose the anterior surface of the right kidney.  The duodenum was then Kocherized medially, which abruptly led [place] to identification of the inferior vena cava. ?   ?Once the colon was adequately mobilized, we moved to the lower pole and identified the gonadal vein and ureter.  The gonadal vein was then left running parallel to the vena cava and the right ureter was reflected anteriorly.  Using cautious cautery, the overlying perihilar attachments were then released.  This yielded visualization of the renal hilum, which included a single right renal vein and a single right renal artery.  The perilymphatic tissue surrounding the right renal artery were carefully released so that the right renal artery was fully encircled. ?   ?We next turned our attention to defatting the kidney.  An anterior incision along Gerota's fascia was created and the kidney was fully mobilized.   The renal mass is identified at the lower pole.  Using intraoperative ultrasound, the tumor was then carefully evaluated and demonstrated heterogenous echogenicity compared to the rest of the renal parenchyma. The resection margin was marked to allow for wide excision of the renal mass.  The renal  hilum was once again identified and a bulldog clamp was placed.   ? ?Using cold scissors, the right renal mass was then carefully excised, leaving a grossly negative margin.  Excision of the mass appeared complete with no tumor grossly remaining.  The tumor was then placed in the right lower quadrant, to be retrieved following repair of the renal defect.   A running 3-0 V lock suture was then used to reapproximate the resection bed.  Tension was placed with hemo-lock clips.   The right renal capsule was then reapproximated using a 0 Vicryl suture on a CT-1 needle in an interrupted fashion using hemo-lock clips as a buttress.  Lapra-Ty's were then placed on each of the interrupted sutures.  The bulldog was then released, which warm ischemia time totaled 19 minutes.   Once hemostasis was achieved and the renal bed was irrigated, Surgicel and Vistaseal was then placed over the renorrhaphy.  Gerota's fascia was then reapproximated with a running v-lok suture and the mesocolonic fat along the descending colon was then reapproximated to the right abdominal sidewall using Hem-o-lok clips.  The renal mass was retrieved and placed in an Endo Catch bag.  The mass was extracted through the 15 mm assistant port.  The fascia of the assistant port was then reapproximated with a 0 Vicryl suture. ?   ?The abdomen was desufflated with all ports removed.  The skin was then reapproximated using running Monocryl and dressed appropriately.  The patient was then replaced in the supine position and was awakened from anesthesia without complications. ? ?Warm Ischemia Time: 19 minutes ? ?Plan: Monitor on the floor overnight ? ?

## 2021-06-25 ENCOUNTER — Ambulatory Visit (HOSPITAL_COMMUNITY): Payer: Medicare HMO | Admitting: Hematology

## 2021-06-25 ENCOUNTER — Encounter (HOSPITAL_COMMUNITY): Payer: Self-pay | Admitting: Urology

## 2021-06-25 DIAGNOSIS — C641 Malignant neoplasm of right kidney, except renal pelvis: Secondary | ICD-10-CM | POA: Diagnosis not present

## 2021-06-25 LAB — BASIC METABOLIC PANEL
Anion gap: 9 (ref 5–15)
BUN: 34 mg/dL — ABNORMAL HIGH (ref 8–23)
CO2: 21 mmol/L — ABNORMAL LOW (ref 22–32)
Calcium: 8.3 mg/dL — ABNORMAL LOW (ref 8.9–10.3)
Chloride: 109 mmol/L (ref 98–111)
Creatinine, Ser: 1.97 mg/dL — ABNORMAL HIGH (ref 0.61–1.24)
GFR, Estimated: 35 mL/min — ABNORMAL LOW (ref >60)
Glucose, Bld: 209 mg/dL — ABNORMAL HIGH (ref 70–99)
Potassium: 3.9 mmol/L (ref 3.5–5.1)
Sodium: 139 mmol/L (ref 135–145)

## 2021-06-25 LAB — HEMOGLOBIN AND HEMATOCRIT, BLOOD
HCT: 28.7 % — ABNORMAL LOW (ref 39.0–52.0)
HCT: 31.7 % — ABNORMAL LOW (ref 39.0–52.0)
Hemoglobin: 9.3 g/dL — ABNORMAL LOW (ref 13.0–17.0)
Hemoglobin: 10.3 g/dL — ABNORMAL LOW (ref 13.0–17.0)

## 2021-06-25 LAB — GLUCOSE, CAPILLARY
Glucose-Capillary: 169 mg/dL — ABNORMAL HIGH (ref 70–99)
Glucose-Capillary: 179 mg/dL — ABNORMAL HIGH (ref 70–99)
Glucose-Capillary: 166 mg/dL — ABNORMAL HIGH (ref 70–99)
Glucose-Capillary: 161 mg/dL — ABNORMAL HIGH (ref 70–99)

## 2021-06-25 MED ORDER — BISACODYL 10 MG RE SUPP
10.0000 mg | Freq: Once | RECTAL | Status: DC
  Filled 2021-06-25: qty 1

## 2021-06-25 MED ORDER — CHLORHEXIDINE GLUCONATE CLOTH 2 % EX PADS: 6.0000 | MEDICATED_PAD | Freq: Every day | CUTANEOUS | Status: DC

## 2021-06-25 NOTE — Progress Notes (Signed)
1 Day Post-Op ?Subjective: ?The patient is doing well.  No nausea or vomiting. No flatus. Pain is adequately controlled.  Has been on bedrest.  Using IS. Expected rise in Cr. 2 g drop in Hg. Good UO.  ? ?Objective: ?Vital signs in last 24 hours: ?Temp:  [97.6 ?F (36.4 ?C)-98.4 ?F (36.9 ?C)] 98.2 ?F (36.8 ?C) (05/03 0532) ?Pulse Rate:  [53-74] 65 (05/03 0532) ?Resp:  [12-20] 20 (05/03 0532) ?BP: (112-168)/(49-66) 116/52 (05/03 0532) ?SpO2:  [93 %-100 %] 100 % (05/03 0532) ? ?Intake/Output from previous day: ?05/02 0701 - 05/03 0700 ?In: 3073.8 [P.O.:240; I.V.:2733.8; IV Piggyback:100] ?Out: 1500 [Urine:1350; Blood:150] ?Intake/Output this shift: ?No intake/output data recorded. ? ?Physical Exam:  ?General: Alert and oriented. ?CV: RRR ?Lungs: faint crackles bilateral bases ?GI: Soft, Nondistended. Approp tender.  Faint BS ?Incisions: Clean and dry. ?Urine: Clear ?Extremities: SCDs in place  ? ?Lab Results: ?Recent Labs  ?  06/24/21 ?1045 06/25/21 ?0333  ?HGB 10.6* 9.3*  ?HCT 33.0* 28.7*  ? ?       ?Recent Labs  ?  06/25/21 ?0333  ?CREATININE 1.97*  ? ? ?       ?Results for orders placed or performed during the hospital encounter of 06/24/21 (from the past 24 hour(s))  ?Glucose, capillary     Status: Abnormal  ? Collection Time: 06/24/21 10:21 AM  ?Result Value Ref Range  ? Glucose-Capillary 231 (H) 70 - 99 mg/dL  ?Hemoglobin and hematocrit, blood     Status: Abnormal  ? Collection Time: 06/24/21 10:45 AM  ?Result Value Ref Range  ? Hemoglobin 10.6 (L) 13.0 - 17.0 g/dL  ? HCT 33.0 (L) 39.0 - 52.0 %  ?Glucose, capillary     Status: Abnormal  ? Collection Time: 06/24/21  5:09 PM  ?Result Value Ref Range  ? Glucose-Capillary 290 (H) 70 - 99 mg/dL  ?Glucose, capillary     Status: Abnormal  ? Collection Time: 06/24/21  8:57 PM  ?Result Value Ref Range  ? Glucose-Capillary 253 (H) 70 - 99 mg/dL  ?Basic metabolic panel     Status: Abnormal  ? Collection Time: 06/25/21  3:33 AM  ?Result Value Ref Range  ? Sodium 139 135 - 145  mmol/L  ? Potassium 3.9 3.5 - 5.1 mmol/L  ? Chloride 109 98 - 111 mmol/L  ? CO2 21 (L) 22 - 32 mmol/L  ? Glucose, Bld 209 (H) 70 - 99 mg/dL  ? BUN 34 (H) 8 - 23 mg/dL  ? Creatinine, Ser 1.97 (H) 0.61 - 1.24 mg/dL  ? Calcium 8.3 (L) 8.9 - 10.3 mg/dL  ? GFR, Estimated 35 (L) >60 mL/min  ? Anion gap 9 5 - 15  ?Hemoglobin and hematocrit, blood     Status: Abnormal  ? Collection Time: 06/25/21  3:33 AM  ?Result Value Ref Range  ? Hemoglobin 9.3 (L) 13.0 - 17.0 g/dL  ? HCT 28.7 (L) 39.0 - 52.0 %  ?Glucose, capillary     Status: Abnormal  ? Collection Time: 06/25/21  7:37 AM  ?Result Value Ref Range  ? Glucose-Capillary 169 (H) 70 - 99 mg/dL  ? ? ?Assessment/Plan: ?POD# 1 s/p robotic partial nephrectomy. ? ?Doing well ? ?1) Ambulate, Incentive spirometry ?2) Leave diet at clears until some return of bowel function  ?3) Transition to oral pain medication ?4) Dulcolax suppository ?5) D/C urethral catheter ?6) SL IVF ?7) Repeat H/H at 11.  Drop partly due to hemodilution.  No significant EBL during surgery. ? ? ? ?  LOS: 0 days  ? ?Debbrah Alar ?06/25/2021, 8:08 AM ? ? ?   ?

## 2021-06-25 NOTE — Anesthesia Postprocedure Evaluation (Signed)
Anesthesia Post Note ? ?Patient: Todd Gates ? ?Procedure(s) Performed: XI ROBOTIC ASSITED PARTIAL NEPHRECTOMY (Right: Renal) ? ?  ? ?Patient location during evaluation: PACU ?Anesthesia Type: General ?Level of consciousness: awake ?Pain management: pain level controlled ?Vital Signs Assessment: post-procedure vital signs reviewed and stable ?Respiratory status: spontaneous breathing and respiratory function stable ?Cardiovascular status: stable ?Postop Assessment: no apparent nausea or vomiting ?Anesthetic complications: no ? ? ?No notable events documented. ? ?Last Vitals:  ?Vitals:  ? 06/25/21 0220 06/25/21 0532  ?BP: 112/60 (!) 116/52  ?Pulse: 69 65  ?Resp: 20 20  ?Temp: 36.9 ?C 36.8 ?C  ?SpO2: 100% 100%  ?  ?Last Pain:  ?Vitals:  ? 06/25/21 0628  ?TempSrc:   ?PainSc: Asleep  ? ? ?  ?  ?  ?  ?  ?  ? ?Merlinda Frederick ? ? ? ? ?

## 2021-06-26 DIAGNOSIS — C641 Malignant neoplasm of right kidney, except renal pelvis: Secondary | ICD-10-CM | POA: Diagnosis not present

## 2021-06-26 LAB — BASIC METABOLIC PANEL
Anion gap: 9 (ref 5–15)
BUN: 35 mg/dL — ABNORMAL HIGH (ref 8–23)
CO2: 22 mmol/L (ref 22–32)
Calcium: 8.6 mg/dL — ABNORMAL LOW (ref 8.9–10.3)
Chloride: 110 mmol/L (ref 98–111)
Creatinine, Ser: 1.9 mg/dL — ABNORMAL HIGH (ref 0.61–1.24)
GFR, Estimated: 36 mL/min — ABNORMAL LOW (ref >60)
Glucose, Bld: 183 mg/dL — ABNORMAL HIGH (ref 70–99)
Potassium: 3.7 mmol/L (ref 3.5–5.1)
Sodium: 141 mmol/L (ref 135–145)

## 2021-06-26 LAB — GLUCOSE, CAPILLARY: Glucose-Capillary: 164 mg/dL — ABNORMAL HIGH (ref 70–99)

## 2021-06-26 LAB — HEMOGLOBIN AND HEMATOCRIT, BLOOD
HCT: 28.9 % — ABNORMAL LOW (ref 39.0–52.0)
Hemoglobin: 9.5 g/dL — ABNORMAL LOW (ref 13.0–17.0)

## 2021-06-26 NOTE — Plan of Care (Signed)
DC instructions reviewed with patient, questions answered, verbalized understanding.  Patient transported to main entrance to be taken home by brother.   ?

## 2021-06-26 NOTE — Discharge Summary (Signed)
Date of admission: 06/24/2021 ? ?Date of discharge: 06/26/2021 ? ?Admission diagnosis: Right renal mass ? ?Discharge diagnosis: same ? ?Secondary diagnoses: CaP s/p RALP, peptic ulcer, HTN, glaucoma ? ?History and Physical: For full details, please see admission history and physical. Briefly, Todd Gates is a 76 y.o. year old patient with a 2.5 cm cystic lesion involving the right kidney with features concerning for renal cell carcinoma. He has a significant past medical history of grade 3 prostate cancer, status post robotic prostatectomy in 2009 with subsequent PSA recurrence requiring salvage radiation therapy. He [place] [place] His last PSA was undetectable.  ? ?Hospital Course: Pt was admitted and taken to the OR on 06/24/21 for a robotic assisted lap right partial nephrectomy.  Pt tolerated the procedure well and was hemodynamically stable throughout.  He was extubated without complication and woke up from anesthesia neurologically intact. Post op course progressed as expected. On POD 1 he was able to ambulate and was using the IS.  Foley was removed and he voided.  His diet was advanced as tolerated to regular on POD 1 as well.  By POD 2 he was passing flatus and had a small BM.  There was a slight expected rise in his post op Cr.  H/H dropped but remained stable and he was asymptomatic. He had met all criteria and was felt stable for d/c home on POD 2. ? ?Laboratory values:  ?Recent Labs  ?  06/25/21 ?0315405/03/23 ?1034 06/26/21 ?0351  ?HGB 9.3* 10.3* 9.5*  ?HCT 28.7* 31.7* 28.9*  ? ?Recent Labs  ?  06/25/21 ?0008605/04/23 ?0351  ?CREATININE 1.97* 1.90*  ? ? ?Disposition: Home ? ?Discharge instruction: The patient was instructed to be ambulatory but told to refrain from heavy lifting, strenuous activity, or driving.  ? ?Discharge medications:  ?Allergies as of 06/26/2021   ?No Known Allergies ?  ? ?  ?Medication List  ?  ? ?TAKE these medications   ? ?acetaminophen  500 MG tablet ?Commonly known as: TYLENOL ?Take 500 mg by mouth every 6 (six) hours as needed for mild pain. ?  ?atorvastatin 10 MG tablet ?Commonly known as: LIPITOR ?Take 10 mg by mouth daily. ?  ?CALCIUM 600 PO ?Take 600 mg by mouth daily. ?  ?chlorthalidone 25 MG tablet ?Commonly known as: HYGROTON ?Take 25 mg by mouth daily. ?  ?docusate sodium 100 MG capsule ?Commonly known as: COLACE ?Take 1 capsule (100 mg total) by mouth 2 (two) times daily. ?  ?febuxostat 40 MG tablet ?Commonly known as: ULORIC ?Take 40 mg by mouth daily as needed (gout flares). ?  ?HYDROcodone-acetaminophen 5-325 MG tablet ?Commonly known as: Norco ?Take 1-2 tablets by mouth every 6 (six) hours as needed for moderate pain or severe pain. ?  ?latanoprost 0.005 % ophthalmic solution ?Commonly known as: XALATAN ?Place 1 drop into both eyes at bedtime. ?  ?lisinopril 40 MG tablet ?Commonly known as: ZESTRIL ?Take 40 mg by mouth daily. ?  ?metFORMIN 750 MG 24 hr tablet ?Commonly known as: GLUCOPHAGE-XR ?Take 750 mg by mouth daily. ?  ?promethazine 12.5 MG tablet ?Commonly known as: PHENERGAN ?Take 1 tablet (12.5 mg total) by mouth every 4 (four) hours as needed for nausea or vomiting. ?  ?Simbrinza 1-0.2 % Susp ?Generic drug: Brinzolamide-Brimonidine ?Place 1 drop into both eyes in the morning, at noon, and at bedtime. ?  ? ?  ? ? ?Followup:  ? Follow-up Information   ? ? Winter,  Conception Oms, MD Follow up on 07/10/2021.   ?Specialty: Urology ?Why: at 9:30 ?Contact information: ?[address] ?[address] ?[place] [address] ?[phone] ? ? ?  ?  ? ?  ?  ? ?  ?  ?

## 2021-06-27 LAB — SURGICAL PATHOLOGY

## 2021-07-08 DIAGNOSIS — C649 Malignant neoplasm of unspecified kidney, except renal pelvis: Secondary | ICD-10-CM | POA: Diagnosis not present

## 2021-07-08 DIAGNOSIS — E1169 Type 2 diabetes mellitus with other specified complication: Secondary | ICD-10-CM | POA: Diagnosis not present

## 2021-07-08 DIAGNOSIS — Z6832 Body mass index (BMI) 32.0-32.9, adult: Secondary | ICD-10-CM | POA: Diagnosis not present

## 2021-07-08 DIAGNOSIS — I10 Essential (primary) hypertension: Secondary | ICD-10-CM | POA: Diagnosis not present

## 2021-07-08 DIAGNOSIS — E785 Hyperlipidemia, unspecified: Secondary | ICD-10-CM | POA: Diagnosis not present

## 2021-07-08 DIAGNOSIS — D649 Anemia, unspecified: Secondary | ICD-10-CM | POA: Diagnosis not present

## 2021-07-09 DIAGNOSIS — Z6832 Body mass index (BMI) 32.0-32.9, adult: Secondary | ICD-10-CM | POA: Diagnosis not present

## 2021-07-09 DIAGNOSIS — Z0001 Encounter for general adult medical examination with abnormal findings: Secondary | ICD-10-CM | POA: Diagnosis not present

## 2021-07-09 DIAGNOSIS — Z8546 Personal history of malignant neoplasm of prostate: Secondary | ICD-10-CM | POA: Diagnosis not present

## 2021-07-09 DIAGNOSIS — E1169 Type 2 diabetes mellitus with other specified complication: Secondary | ICD-10-CM | POA: Diagnosis not present

## 2021-07-09 DIAGNOSIS — D649 Anemia, unspecified: Secondary | ICD-10-CM | POA: Diagnosis not present

## 2021-07-09 DIAGNOSIS — C649 Malignant neoplasm of unspecified kidney, except renal pelvis: Secondary | ICD-10-CM | POA: Diagnosis not present

## 2021-07-09 DIAGNOSIS — I10 Essential (primary) hypertension: Secondary | ICD-10-CM | POA: Diagnosis not present

## 2021-07-09 DIAGNOSIS — E785 Hyperlipidemia, unspecified: Secondary | ICD-10-CM | POA: Diagnosis not present

## 2021-07-14 ENCOUNTER — Encounter: Payer: Self-pay | Admitting: Orthopaedic Surgery

## 2021-07-14 ENCOUNTER — Ambulatory Visit (INDEPENDENT_AMBULATORY_CARE_PROVIDER_SITE_OTHER): Payer: No Typology Code available for payment source

## 2021-07-14 ENCOUNTER — Ambulatory Visit (INDEPENDENT_AMBULATORY_CARE_PROVIDER_SITE_OTHER): Payer: No Typology Code available for payment source | Admitting: Orthopaedic Surgery

## 2021-07-14 DIAGNOSIS — Z96642 Presence of left artificial hip joint: Secondary | ICD-10-CM

## 2021-07-14 NOTE — Progress Notes (Signed)
The patient is now around 9 months status post a left total hip arthroplasty.  Earlier this month he actually had a nephrectomy due to cancer.  That was done through robotic surgery.  He says his hip is moving well.  He does have some stiffness and at times gives him trouble but he has no issues with that at all.  He denies any issues with his right hip.  He walks without assistive device or a limp.  He is left operative hip is just only slightly stiff.  His right hip moves smoothly.  AP pelvis and lateral of the left hip shows a well-seated total hip arthroplasty with no complicating features.  The joint space on the right hip is well-maintained.  At this point follow-up can be as needed for his left hip.  He understands if he has any issues with it at all not to hesitate to give [place] a call.  All questions and concerns were answered and addressed.

## 2021-08-13 ENCOUNTER — Ambulatory Visit: Payer: Medicare HMO | Admitting: Orthopaedic Surgery

## 2021-08-13 ENCOUNTER — Ambulatory Visit (INDEPENDENT_AMBULATORY_CARE_PROVIDER_SITE_OTHER): Payer: No Typology Code available for payment source

## 2021-08-13 ENCOUNTER — Encounter: Payer: Self-pay | Admitting: Orthopaedic Surgery

## 2021-08-13 ENCOUNTER — Ambulatory Visit: Payer: No Typology Code available for payment source | Admitting: Orthopaedic Surgery

## 2021-08-13 DIAGNOSIS — G8929 Other chronic pain: Secondary | ICD-10-CM

## 2021-08-13 DIAGNOSIS — M25561 Pain in right knee: Secondary | ICD-10-CM

## 2021-08-13 MED ORDER — LIDOCAINE HCL 1 % IJ SOLN
3.0000 mL | INTRAMUSCULAR | Status: AC | PRN
  Administered 2021-08-13: 3 mL

## 2021-08-13 MED ORDER — METHYLPREDNISOLONE ACETATE 40 MG/ML IJ SUSP
40.0000 mg | INTRAMUSCULAR | Status: AC | PRN
  Administered 2021-08-13: 40 mg via INTRA_ARTICULAR

## 2021-08-13 NOTE — Progress Notes (Signed)
Office Visit Note   Patient: Todd Gates           Date of Birth: 1945-11-23           MRN: [id number] Visit Date: 08/13/2021              Requested by: [place] Nation, MD [place],  [place] [id number] PCP: Washburn Nation, MD   Assessment & Plan: Visit Diagnoses:  1. Chronic pain of right knee     Plan:   I did place a steroid injection in his right knee today per his request and he tolerated well.  I counseled him about glucose.  He may be a candidate eventually for hyaluronic acid for his knees if they start bothering him enough.  He says his biggest issue is going up and down stairs.  His wife is at home with dementia so he wants to try to be as conservative as possible while he takes care of her.  Follow-Up Instructions: Return if symptoms worsen or fail to improve.   Orders:  Orders Placed This Encounter  Procedures   Large Joint Inj   XR Knee 1-2 Views Right   No orders of the defined types were placed in this encounter.     Procedures: Large Joint Inj: R knee on 08/13/2021 2:47 PM Indications: diagnostic evaluation and pain Details: 22 G 1.5 in needle, superolateral approach  Arthrogram: No  Medications: 3 mL lidocaine 1 %; 40 mg methylPREDNISolone acetate 40 MG/ML Outcome: tolerated well, no immediate complications Procedure, treatment alternatives, risks and benefits explained, specific risks discussed. Consent was given by the patient. Immediately prior to procedure a time out was called to verify the correct patient, procedure, equipment, support staff and site/side marked as required. Patient was prepped and draped in the usual sterile fashion.       Clinical Data: No additional findings.   Subjective: Chief Complaint  Patient presents with   Right Knee - Pain  The comes in today with bilateral knee pain with right worse than left.  His right knee has been hurting him for about a month or more now with going up and down stairs and with  standing.  He has a history of a left hip replacement that we did in August.  He is also has a history of a kidney resection.  He is a diabetic and his last hemoglobin A1c was 7.3.  He is requesting a steroid injection in his right knee today.  He is 76 years old.  He does see his primary care physician tomorrow for regular checkup.  HPI  Review of Systems There is no fever, chills, nausea, vomiting  Objective: Vital Signs: There were no vitals taken for this visit.  Physical Exam He is alert and orient x3 and in no acute distress Ortho Exam Examination of both knees shows slight varus malalignment.  There is slight warmth of both knees but good range of motion overall.  There is obvious arthritic signs and symptoms but no instability on exam. Specialty Comments:  No specialty comments available.  Imaging: XR Knee 1-2 Views Right  Result Date: 08/13/2021 Standing views that show both knees shows well-maintained medial lateral compartments of the right knee with good alignment.  The left knee shows varus malalignment with medial arthritic changes.  The right knee also has some patellofemoral narrowing.    PMFS History: Patient Active Problem List   Diagnosis Date Noted   Renal mass 06/24/2021  Iron deficiency anemia 03/31/2021   Right kidney mass 03/03/2021   Normocytic anemia 03/03/2021   Unilateral primary osteoarthritis, left hip 10/15/2020   Status post left hip replacement 10/15/2020   Achilles tendon contracture, left 02/21/2016   Plantar fasciitis 01/23/2016   Primary osteoarthritis of left knee 07/13/2013   Past Medical History:  Diagnosis Date   Arthritis    Cancer [facility])    prostate; prostatectomy; radiation   Diabetes mellitus without complication ([place])    TYPE 2   Fatty liver    Glaucoma    Both eyes   Gout    HTN (hypertension)    Peptic ulcer disease    Pneumonia    HX OF AS A CHILD   Sleep apnea    HAS CPAP    Family History  Problem Relation Age  of Onset   Hypertension Unknown     Past Surgical History:  Procedure Laterality Date   COLONOSCOPY W/ POLYPECTOMY  02/2020   PROSTATE SURGERY     PROSTATECTOMY  2008   ROBOTIC ASSITED PARTIAL NEPHRECTOMY Right 06/24/2021   Procedure: XI ROBOTIC ASSITED PARTIAL NEPHRECTOMY;  Surgeon: Ceasar Mons, MD;  Location: [facility];  Service: Urology;  Laterality: Right;   TONSILLECTOMY     TOTAL HIP ARTHROPLASTY Left 10/15/2020   Procedure: LEFT TOTAL HIP ARTHROPLASTY ANTERIOR APPROACH;  Surgeon: Mcarthur Rossetti, MD;  Location: [facility];  Service: Orthopedics;  Laterality: Left;   Social History   Occupational History   Not on file  Tobacco Use   Smoking status: Former    Types: Cigarettes   Smokeless tobacco: Never  Vaping Use   Vaping Use: Not on file  Substance and Sexual Activity   Alcohol use: No   Drug use: No   Sexual activity: Not on file

## 2021-08-14 DIAGNOSIS — E785 Hyperlipidemia, unspecified: Secondary | ICD-10-CM | POA: Diagnosis not present

## 2021-08-14 DIAGNOSIS — R5383 Other fatigue: Secondary | ICD-10-CM | POA: Diagnosis not present

## 2021-08-14 DIAGNOSIS — D529 Folate deficiency anemia, unspecified: Secondary | ICD-10-CM | POA: Diagnosis not present

## 2021-08-14 DIAGNOSIS — E559 Vitamin D deficiency, unspecified: Secondary | ICD-10-CM | POA: Diagnosis not present

## 2021-08-14 DIAGNOSIS — E1169 Type 2 diabetes mellitus with other specified complication: Secondary | ICD-10-CM | POA: Diagnosis not present

## 2021-08-18 DIAGNOSIS — D529 Folate deficiency anemia, unspecified: Secondary | ICD-10-CM | POA: Diagnosis not present

## 2021-08-18 DIAGNOSIS — E785 Hyperlipidemia, unspecified: Secondary | ICD-10-CM | POA: Diagnosis not present

## 2021-08-18 DIAGNOSIS — E1169 Type 2 diabetes mellitus with other specified complication: Secondary | ICD-10-CM | POA: Diagnosis not present

## 2021-08-18 DIAGNOSIS — R5383 Other fatigue: Secondary | ICD-10-CM | POA: Diagnosis not present

## 2021-08-20 ENCOUNTER — Ambulatory Visit: Payer: Medicare HMO | Admitting: Orthopaedic Surgery

## 2021-08-20 DIAGNOSIS — Z6832 Body mass index (BMI) 32.0-32.9, adult: Secondary | ICD-10-CM | POA: Diagnosis not present

## 2021-08-20 DIAGNOSIS — D649 Anemia, unspecified: Secondary | ICD-10-CM | POA: Diagnosis not present

## 2021-08-20 DIAGNOSIS — E1169 Type 2 diabetes mellitus with other specified complication: Secondary | ICD-10-CM | POA: Diagnosis not present

## 2021-08-20 DIAGNOSIS — E785 Hyperlipidemia, unspecified: Secondary | ICD-10-CM | POA: Diagnosis not present

## 2021-08-20 DIAGNOSIS — Z85528 Personal history of other malignant neoplasm of kidney: Secondary | ICD-10-CM | POA: Diagnosis not present

## 2021-08-20 DIAGNOSIS — I10 Essential (primary) hypertension: Secondary | ICD-10-CM | POA: Diagnosis not present

## 2021-09-01 DIAGNOSIS — B351 Tinea unguium: Secondary | ICD-10-CM | POA: Diagnosis not present

## 2021-09-01 DIAGNOSIS — M2010 Hallux valgus (acquired), unspecified foot: Secondary | ICD-10-CM | POA: Diagnosis not present

## 2021-09-17 DIAGNOSIS — Z6832 Body mass index (BMI) 32.0-32.9, adult: Secondary | ICD-10-CM | POA: Diagnosis not present

## 2021-09-17 DIAGNOSIS — E1169 Type 2 diabetes mellitus with other specified complication: Secondary | ICD-10-CM | POA: Diagnosis not present

## 2021-09-17 DIAGNOSIS — E785 Hyperlipidemia, unspecified: Secondary | ICD-10-CM | POA: Diagnosis not present

## 2021-09-17 DIAGNOSIS — I10 Essential (primary) hypertension: Secondary | ICD-10-CM | POA: Diagnosis not present

## 2021-09-17 DIAGNOSIS — D649 Anemia, unspecified: Secondary | ICD-10-CM | POA: Diagnosis not present

## 2021-09-17 DIAGNOSIS — Z85528 Personal history of other malignant neoplasm of kidney: Secondary | ICD-10-CM | POA: Diagnosis not present

## 2021-10-01 DIAGNOSIS — Z6832 Body mass index (BMI) 32.0-32.9, adult: Secondary | ICD-10-CM | POA: Diagnosis not present

## 2021-10-01 DIAGNOSIS — E1169 Type 2 diabetes mellitus with other specified complication: Secondary | ICD-10-CM | POA: Diagnosis not present

## 2021-10-01 DIAGNOSIS — E785 Hyperlipidemia, unspecified: Secondary | ICD-10-CM | POA: Diagnosis not present

## 2021-10-01 DIAGNOSIS — Z85528 Personal history of other malignant neoplasm of kidney: Secondary | ICD-10-CM | POA: Diagnosis not present

## 2021-10-01 DIAGNOSIS — I10 Essential (primary) hypertension: Secondary | ICD-10-CM | POA: Diagnosis not present

## 2021-10-01 DIAGNOSIS — D649 Anemia, unspecified: Secondary | ICD-10-CM | POA: Diagnosis not present

## 2021-10-08 ENCOUNTER — Ambulatory Visit: Payer: Self-pay | Admitting: Licensed Clinical Social Worker

## 2021-10-08 NOTE — Patient Outreach (Signed)
  Care Coordination   Initial Visit Note   10/08/2021 Name: Todd Gates MRN: [id number] DOB: 1945-04-28  Todd Gates is a 76 y.o. year old male who sees Todd Nation, MD for primary care. I spoke with  Hendricks Gates by phone today  What matters to the patients health and wellness today?  Patient currently has COVID.     Goals Addressed               This Visit's Progress     MSW Care Coordination (pt-stated)        Patient currently has COVID. Patient denied needing to speak with RNCM.  Patient agrees to contact clinic if needed. SW completed chart review and SDOH screening        SDOH assessments and interventions completed:  Yes     Care Coordination Interventions Activated:  Yes  Care Coordination Interventions:  Yes, provided   Follow up plan: No further intervention required.   Encounter Outcome:  Pt. Visit Completed   [name], MSW  Social Worker [facility]  [phone]

## 2021-10-08 NOTE — Patient Instructions (Signed)
Visit Information  Instructions:   Patient was given the following information about care management and care coordination services today, agreed to services, and gave verbal consent: 1.care management/care coordination services include personalized support from designated clinical staff supervised by their physician, including individualized plan of care and coordination with other care providers 2. 24/7 contact phone numbers for assistance for urgent and routine care needs. 3. The patient may stop care management/care coordination services at any time by phone call to the office staff.  Patient verbalizes understanding of instructions and care plan provided today and agrees to view in MyChart. Active MyChart status and patient understanding of how to access instructions and care plan via MyChart confirmed with patient.     No further follow up required: .  Skyelynn Rambeau, BSW , MSW Social Worker [facility]  [phone]      

## 2021-10-22 DIAGNOSIS — I10 Essential (primary) hypertension: Secondary | ICD-10-CM | POA: Diagnosis not present

## 2021-10-22 DIAGNOSIS — R030 Elevated blood-pressure reading, without diagnosis of hypertension: Secondary | ICD-10-CM | POA: Diagnosis not present

## 2021-10-22 DIAGNOSIS — R600 Localized edema: Secondary | ICD-10-CM | POA: Diagnosis not present

## 2021-11-06 ENCOUNTER — Other Ambulatory Visit (HOSPITAL_COMMUNITY): Payer: Self-pay | Admitting: Urology

## 2021-11-06 ENCOUNTER — Ambulatory Visit (HOSPITAL_COMMUNITY)
Admission: RE | Admit: 2021-11-06 | Discharge: 2021-11-06 | Disposition: A | Discharge status: Home / Self Care | Payer: No Typology Code available for payment source | Source: Ambulatory Visit | Attending: Urology | Admitting: Urology

## 2021-11-06 DIAGNOSIS — Z85528 Personal history of other malignant neoplasm of kidney: Secondary | ICD-10-CM | POA: Insufficient documentation

## 2021-11-06 DIAGNOSIS — C649 Malignant neoplasm of unspecified kidney, except renal pelvis: Secondary | ICD-10-CM | POA: Diagnosis not present

## 2021-11-12 DIAGNOSIS — E785 Hyperlipidemia, unspecified: Secondary | ICD-10-CM | POA: Diagnosis not present

## 2021-11-12 DIAGNOSIS — I10 Essential (primary) hypertension: Secondary | ICD-10-CM | POA: Diagnosis not present

## 2021-11-12 DIAGNOSIS — E1169 Type 2 diabetes mellitus with other specified complication: Secondary | ICD-10-CM | POA: Diagnosis not present

## 2021-11-12 DIAGNOSIS — R5383 Other fatigue: Secondary | ICD-10-CM | POA: Diagnosis not present

## 2021-11-17 DIAGNOSIS — B351 Tinea unguium: Secondary | ICD-10-CM | POA: Diagnosis not present

## 2021-11-17 DIAGNOSIS — D649 Anemia, unspecified: Secondary | ICD-10-CM | POA: Diagnosis not present

## 2021-11-17 DIAGNOSIS — E785 Hyperlipidemia, unspecified: Secondary | ICD-10-CM | POA: Diagnosis not present

## 2021-11-17 DIAGNOSIS — R600 Localized edema: Secondary | ICD-10-CM | POA: Diagnosis not present

## 2021-11-17 DIAGNOSIS — I10 Essential (primary) hypertension: Secondary | ICD-10-CM | POA: Diagnosis not present

## 2021-11-17 DIAGNOSIS — Z6832 Body mass index (BMI) 32.0-32.9, adult: Secondary | ICD-10-CM | POA: Diagnosis not present

## 2021-11-17 DIAGNOSIS — Z85528 Personal history of other malignant neoplasm of kidney: Secondary | ICD-10-CM | POA: Diagnosis not present

## 2021-11-17 DIAGNOSIS — M2010 Hallux valgus (acquired), unspecified foot: Secondary | ICD-10-CM | POA: Diagnosis not present

## 2021-11-17 DIAGNOSIS — E1169 Type 2 diabetes mellitus with other specified complication: Secondary | ICD-10-CM | POA: Diagnosis not present

## 2021-11-26 DIAGNOSIS — R600 Localized edema: Secondary | ICD-10-CM | POA: Diagnosis not present

## 2021-11-26 DIAGNOSIS — Z85528 Personal history of other malignant neoplasm of kidney: Secondary | ICD-10-CM | POA: Diagnosis not present

## 2021-11-26 DIAGNOSIS — E785 Hyperlipidemia, unspecified: Secondary | ICD-10-CM | POA: Diagnosis not present

## 2021-11-26 DIAGNOSIS — Z6832 Body mass index (BMI) 32.0-32.9, adult: Secondary | ICD-10-CM | POA: Diagnosis not present

## 2021-11-26 DIAGNOSIS — E1140 Type 2 diabetes mellitus with diabetic neuropathy, unspecified: Secondary | ICD-10-CM | POA: Diagnosis not present

## 2021-11-26 DIAGNOSIS — D649 Anemia, unspecified: Secondary | ICD-10-CM | POA: Diagnosis not present

## 2021-11-26 DIAGNOSIS — E1169 Type 2 diabetes mellitus with other specified complication: Secondary | ICD-10-CM | POA: Diagnosis not present

## 2021-11-26 DIAGNOSIS — I10 Essential (primary) hypertension: Secondary | ICD-10-CM | POA: Diagnosis not present

## 2021-11-26 DIAGNOSIS — N189 Chronic kidney disease, unspecified: Secondary | ICD-10-CM | POA: Diagnosis not present

## 2021-12-15 DIAGNOSIS — N189 Chronic kidney disease, unspecified: Secondary | ICD-10-CM | POA: Diagnosis not present

## 2021-12-15 DIAGNOSIS — D649 Anemia, unspecified: Secondary | ICD-10-CM | POA: Diagnosis not present

## 2021-12-15 DIAGNOSIS — E1169 Type 2 diabetes mellitus with other specified complication: Secondary | ICD-10-CM | POA: Diagnosis not present

## 2021-12-15 DIAGNOSIS — R600 Localized edema: Secondary | ICD-10-CM | POA: Diagnosis not present

## 2021-12-15 DIAGNOSIS — Z6831 Body mass index (BMI) 31.0-31.9, adult: Secondary | ICD-10-CM | POA: Diagnosis not present

## 2021-12-15 DIAGNOSIS — Z85528 Personal history of other malignant neoplasm of kidney: Secondary | ICD-10-CM | POA: Diagnosis not present

## 2021-12-15 DIAGNOSIS — E785 Hyperlipidemia, unspecified: Secondary | ICD-10-CM | POA: Diagnosis not present

## 2021-12-15 DIAGNOSIS — I10 Essential (primary) hypertension: Secondary | ICD-10-CM | POA: Diagnosis not present

## 2021-12-15 DIAGNOSIS — E1140 Type 2 diabetes mellitus with diabetic neuropathy, unspecified: Secondary | ICD-10-CM | POA: Diagnosis not present

## 2021-12-19 ENCOUNTER — Other Ambulatory Visit (HOSPITAL_COMMUNITY): Payer: Self-pay | Admitting: *Deleted

## 2021-12-22 ENCOUNTER — Ambulatory Visit (HOSPITAL_COMMUNITY)
Admission: RE | Admit: 2021-12-22 | Discharge: 2021-12-22 | Disposition: A | Discharge status: Home / Self Care | Payer: No Typology Code available for payment source | Source: Ambulatory Visit | Attending: Nephrology | Admitting: Nephrology

## 2021-12-22 DIAGNOSIS — N189 Chronic kidney disease, unspecified: Secondary | ICD-10-CM | POA: Diagnosis not present

## 2021-12-22 DIAGNOSIS — D509 Iron deficiency anemia, unspecified: Secondary | ICD-10-CM | POA: Diagnosis present

## 2021-12-22 DIAGNOSIS — D631 Anemia in chronic kidney disease: Secondary | ICD-10-CM | POA: Diagnosis not present

## 2021-12-22 MED ORDER — SODIUM CHLORIDE 0.9 % IV SOLN
510.0000 mg | INTRAVENOUS | Status: DC
  Administered 2021-12-22: 510 mg via INTRAVENOUS
  Filled 2021-12-22: qty 17

## 2021-12-26 DIAGNOSIS — J329 Chronic sinusitis, unspecified: Secondary | ICD-10-CM | POA: Diagnosis not present

## 2021-12-26 DIAGNOSIS — Z6831 Body mass index (BMI) 31.0-31.9, adult: Secondary | ICD-10-CM | POA: Diagnosis not present

## 2021-12-26 DIAGNOSIS — R030 Elevated blood-pressure reading, without diagnosis of hypertension: Secondary | ICD-10-CM | POA: Diagnosis not present

## 2021-12-27 ENCOUNTER — Emergency Department (HOSPITAL_COMMUNITY)
Admission: EM | Admit: 2021-12-27 | Discharge: 2021-12-27 | Disposition: A | Discharge status: Home / Self Care | Payer: Medicare HMO | Attending: Emergency Medicine | Admitting: Emergency Medicine

## 2021-12-27 ENCOUNTER — Other Ambulatory Visit: Payer: Self-pay

## 2021-12-27 ENCOUNTER — Encounter (HOSPITAL_COMMUNITY): Payer: Self-pay | Admitting: Emergency Medicine

## 2021-12-27 DIAGNOSIS — T7840XA Allergy, unspecified, initial encounter: Secondary | ICD-10-CM | POA: Diagnosis not present

## 2021-12-27 DIAGNOSIS — Z7984 Long term (current) use of oral hypoglycemic drugs: Secondary | ICD-10-CM | POA: Insufficient documentation

## 2021-12-27 DIAGNOSIS — I10 Essential (primary) hypertension: Secondary | ICD-10-CM | POA: Diagnosis not present

## 2021-12-27 DIAGNOSIS — Z8546 Personal history of malignant neoplasm of prostate: Secondary | ICD-10-CM | POA: Insufficient documentation

## 2021-12-27 DIAGNOSIS — E119 Type 2 diabetes mellitus without complications: Secondary | ICD-10-CM | POA: Insufficient documentation

## 2021-12-27 DIAGNOSIS — R220 Localized swelling, mass and lump, head: Secondary | ICD-10-CM | POA: Insufficient documentation

## 2021-12-27 LAB — BASIC METABOLIC PANEL
Anion gap: 9 (ref 5–15)
BUN: 30 mg/dL — ABNORMAL HIGH (ref 8–23)
CO2: 25 mmol/L (ref 22–32)
Calcium: 9.9 mg/dL (ref 8.9–10.3)
Chloride: 107 mmol/L (ref 98–111)
Creatinine, Ser: 1.88 mg/dL — ABNORMAL HIGH (ref 0.61–1.24)
GFR, Estimated: 37 mL/min — ABNORMAL LOW (ref >60)
Glucose, Bld: 151 mg/dL — ABNORMAL HIGH (ref 70–99)
Potassium: 3.8 mmol/L (ref 3.5–5.1)
Sodium: 141 mmol/L (ref 135–145)

## 2021-12-27 LAB — CBC
HCT: 32.2 % — ABNORMAL LOW (ref 39.0–52.0)
Hemoglobin: 10.4 g/dL — ABNORMAL LOW (ref 13.0–17.0)
MCH: 31.5 pg (ref 26.0–34.0)
MCHC: 32.3 g/dL (ref 30.0–36.0)
MCV: 97.6 fL (ref 80.0–100.0)
Platelets: 293 10*3/uL (ref 150–400)
RBC: 3.3 MIL/uL — ABNORMAL LOW (ref 4.22–5.81)
RDW: 13.5 % (ref 11.5–15.5)
WBC: 6.6 10*3/uL (ref 4.0–10.5)
nRBC: 0 % (ref 0.0–0.2)

## 2021-12-27 MED ORDER — LORATADINE 10 MG PO TABS: 10.0000 mg | ORAL_TABLET | Freq: Every day | ORAL | 0 refills | Status: DC | PRN

## 2021-12-27 MED ORDER — PREDNISONE 50 MG PO TABS: 50.0000 mg | ORAL_TABLET | Freq: Every day | ORAL | 0 refills | Status: DC

## 2021-12-27 MED ORDER — METHYLPREDNISOLONE SODIUM SUCC 125 MG IJ SOLR
125.0000 mg | Freq: Once | INTRAMUSCULAR | Status: AC
  Administered 2021-12-27: 125 mg via INTRAVENOUS
  Filled 2021-12-27: qty 2

## 2021-12-27 MED ORDER — FAMOTIDINE IN NACL 20-0.9 MG/50ML-% IV SOLN
20.0000 mg | Freq: Once | INTRAVENOUS | Status: AC
  Administered 2021-12-27: 20 mg via INTRAVENOUS
  Filled 2021-12-27: qty 50

## 2021-12-27 MED ORDER — LABETALOL HCL 100 MG PO TABS: 100.0000 mg | ORAL_TABLET | Freq: Two times a day (BID) | ORAL | 0 refills | Status: DC

## 2021-12-27 MED ORDER — DIPHENHYDRAMINE HCL 50 MG/ML IJ SOLN
25.0000 mg | Freq: Once | INTRAMUSCULAR | Status: AC
  Administered 2021-12-27: 25 mg via INTRAVENOUS
  Filled 2021-12-27: qty 1

## 2021-12-27 MED ORDER — AMLODIPINE BESYLATE 5 MG PO TABS: 5.0000 mg | ORAL_TABLET | Freq: Every day | ORAL | 0 refills | Status: DC

## 2021-12-27 MED ORDER — HYDRALAZINE HCL 10 MG PO TABS: 10.0000 mg | ORAL_TABLET | Freq: Three times a day (TID) | ORAL | 0 refills | Status: DC

## 2021-12-27 NOTE — Discharge Instructions (Addendum)
Your prior iron infusions were Venofer.  Return if swelling worsens.  The steroid (prednisone) will increase your sugar.  Please pay close attention to your diet and your sugar levels while on this medication.  Stop your Lisinopril (Zestril) and start Amlodipine (Norvasc).  Zestril has a common side effect of facial swelling.

## 2021-12-27 NOTE — ED Triage Notes (Signed)
Pt arrives to ED via POV w/friend d/t facial swelling & welps on forehead that started yesterday. Pt states only thing different is he had infusion of Feraheme on Monday. Denies SOB, tongue appears normal, O2 97% during triage. Denies taking otc medication for relief.

## 2021-12-27 NOTE — ED Provider Notes (Addendum)
[facility] EMERGENCY DEPARTMENT Provider Note   CSN: [id number] Arrival date & time: 12/27/21  0930     History  Chief Complaint  Patient presents with   Allergic Reaction    Todd Gates is a 76 y.o. male.  Pt is a 76 yo male with a pmhx significant for HTN, PUD, prostate cancer, sleep apnea, DM2, and chronic anemia.  Pt had an infusion of Feraheme on 10/30.  He developed facial swelling yesterday.  He has had iron infusions in the past, but they took longer.  So, he's not sure if he had the same drug.  Upon epic review, pt had Venofer in the past.  Pt did go to his pcp who put him on amox and flonase.  He said he is no better today.  He denies any swelling to his tongue or lips.  Pt denies any difficulty swallowing.       Home Medications Prior to Admission medications   Medication Sig Start Date End Date Taking? Authorizing Provider  hydrALAZINE (APRESOLINE) 10 MG tablet Take 1 tablet (10 mg total) by mouth 3 (three) times daily. 12/27/21  Yes Isla Pence, MD  loratadine (CLARITIN) 10 MG tablet Take 1 tablet (10 mg total) by mouth daily as needed for allergies (facial swelling). 12/27/21  Yes Isla Pence, MD  predniSONE (DELTASONE) 50 MG tablet Take 1 tablet (50 mg total) by mouth daily with breakfast. 12/27/21  Yes Isla Pence, MD  acetaminophen (TYLENOL) 500 MG tablet Take 500 mg by mouth every 6 (six) hours as needed for mild pain.    [provider]  atorvastatin (LIPITOR) 10 MG tablet Take 10 mg by mouth daily.    [provider]  Brinzolamide-Brimonidine (SIMBRINZA) 1-0.2 % SUSP Place 1 drop into both eyes in the morning, at noon, and at bedtime.    [provider]  Calcium Carbonate (CALCIUM 600 PO) Take 600 mg by mouth daily.    [provider]  chlorthalidone (HYGROTON) 25 MG tablet Take 25 mg by mouth daily.    [provider]  docusate sodium (COLACE) 100 MG capsule Take 1 capsule (100 mg total) by mouth 2 (two)  times daily. 06/24/21   Debbrah Alar, PA-C  febuxostat (ULORIC) 40 MG tablet Take 40 mg by mouth daily as needed (gout flares).    [provider]  HYDROcodone-acetaminophen (NORCO) 5-325 MG tablet Take 1-2 tablets by mouth every 6 (six) hours as needed for moderate pain or severe pain. 06/24/21   Debbrah Alar, PA-C  latanoprost (XALATAN) 0.005 % ophthalmic solution Place 1 drop into both eyes at bedtime.    [provider]  metFORMIN (GLUCOPHAGE-XR) 750 MG 24 hr tablet Take 750 mg by mouth daily.    [provider]  promethazine (PHENERGAN) 12.5 MG tablet Take 1 tablet (12.5 mg total) by mouth every 4 (four) hours as needed for nausea or vomiting. 06/24/21   Debbrah Alar, PA-C      Allergies    Feraheme [ferumoxytol]    Review of Systems   Review of Systems  HENT:  Positive for facial swelling.   All other systems reviewed and are negative.   Physical Exam Updated Vital Signs BP (!) 143/64   Pulse (!) 53   Temp 98.3 F (36.8 C) (Oral)   Resp 18   Ht '5\' 8"'$  (1.727 m)   Wt 93 kg   SpO2 99%   BMI 31.17 kg/m  Physical Exam Vitals and nursing note reviewed.  Constitutional:  Appearance: Normal appearance.  HENT:     Head: Atraumatic.     Comments: Swelling to both upper cheeks.  No swelling to tongue or to lips.    Right Ear: External ear normal.     Left Ear: External ear normal.     Nose: Nose normal.     Mouth/Throat:     Mouth: Mucous membranes are moist.     Pharynx: Oropharynx is clear.  Eyes:     Extraocular Movements: Extraocular movements intact.     Conjunctiva/sclera: Conjunctivae normal.     Pupils: Pupils are equal, round, and reactive to light.  Cardiovascular:     Rate and Rhythm: Normal rate and regular rhythm.     Pulses: Normal pulses.     Heart sounds: Normal heart sounds.  Pulmonary:     Effort: Pulmonary effort is normal.     Breath sounds: Normal breath sounds.  Abdominal:     General: Abdomen is flat. Bowel sounds are  normal.     Palpations: Abdomen is soft.  Musculoskeletal:        General: Normal range of motion.     Cervical back: Normal range of motion and neck supple.  Skin:    General: Skin is warm.     Capillary Refill: Capillary refill takes less than 2 seconds.  Neurological:     General: No focal deficit present.     Mental Status: He is alert and oriented to person, place, and time.  Psychiatric:        Mood and Affect: Mood normal.        Behavior: Behavior normal.     ED Results / Procedures / Treatments   Labs (all labs ordered are listed, but only abnormal results are displayed) Labs Reviewed  CBC - Abnormal; Notable for the following components:      Result Value   RBC 3.30 (*)    Hemoglobin 10.4 (*)    HCT 32.2 (*)    All other components within normal limits  BASIC METABOLIC PANEL - Abnormal; Notable for the following components:   Glucose, Bld 151 (*)    BUN 30 (*)    Creatinine, Ser 1.88 (*)    GFR, Estimated 37 (*)    All other components within normal limits    EKG None  Radiology No results found.  Procedures Procedures    Medications Ordered in ED Medications  diphenhydrAMINE (BENADRYL) injection 25 mg (25 mg Intravenous Given 12/27/21 1025)  methylPREDNISolone sodium succinate (SOLU-MEDROL) 125 mg/2 mL injection 125 mg (125 mg Intravenous Given 12/27/21 1025)  famotidine (PEPCID) IVPB 20 mg premix (0 mg Intravenous Stopped 12/27/21 1058)    ED Course/ Medical Decision Making/ A&P                           Medical Decision Making Amount and/or Complexity of Data Reviewed Labs: ordered.  Risk OTC drugs. Prescription drug management.   This patient presents to the ED for concern of facial swelling, this involves an extensive number of treatment options, and is a complaint that carries with it a high risk of complications and morbidity.  The differential diagnosis includes allergic rxn, sinusitis   Co morbidities that complicate the patient  evaluation  HTN, PUD, prostate cancer, sleep apnea, DM2, and chronic anemia   Additional history obtained:  Additional history obtained from epic chart review External records from outside source obtained and reviewed including friend   Lab  Tests:  I Ordered, and personally interpreted labs.  The pertinent results include:  cbc with hgb 10.4 (improved), bmp with glucose 151 and cr 1.88 (chronic)   Cardiac Monitoring:  The patient was maintained on a cardiac monitor.  I personally viewed and interpreted the cardiac monitored which showed an underlying rhythm of: nsr   Medicines ordered and prescription drug management:  I ordered medication including solumedrol, pepcid, and benadryl  for allergic sx  Reevaluation of the patient after these medicines showed that the patient improved I have reviewed the patients home medicines and have made adjustments as needed  Critical Interventions:  Allergy meds    Problem List / ED Course:  Facial swelling:  no tongue or lip swelling.  Pt is on lisinopril.  I am going to take him off this medication (norvasc makes his ankles swell; he is on labetalol which was just started; he is already on a diuretic (lasix)).  Pt has responded to benadryl, pepcid, and solumedrol.  He is d/c with prednisone and is warned about elevated bs.  He is to return if worse.  Pt is to monitor his bp.  He has an appt with his pcp.   Reevaluation:  After the interventions noted above, I reevaluated the patient and found that they have :improved   Social Determinants of Health:  Lives at home.   Dispostion:  After consideration of the diagnostic results and the patients response to treatment, I feel that the patent would benefit from discharge with outpatient f/u.          Final Clinical Impression(s) / ED Diagnoses Final diagnoses:  Allergic reaction, initial encounter  Facial swelling    Rx / DC Orders ED Discharge Orders          Ordered     predniSONE (DELTASONE) 50 MG tablet  Daily with breakfast        12/27/21 1102    loratadine (CLARITIN) 10 MG tablet  Daily PRN        12/27/21 1102    amLODipine (NORVASC) 5 MG tablet  Daily,   Status:  Discontinued        12/27/21 1109    hydrALAZINE (APRESOLINE) 10 MG tablet  3 times daily        12/27/21 1118              Isla Pence, MD 12/27/21 1110    Isla Pence, MD 12/27/21 Williams, Lorine Iannaccone, MD 12/27/21 1136

## 2021-12-29 ENCOUNTER — Encounter (HOSPITAL_COMMUNITY): Payer: Medicare HMO

## 2021-12-29 DIAGNOSIS — N1832 Chronic kidney disease, stage 3b: Secondary | ICD-10-CM | POA: Diagnosis not present

## 2021-12-30 DIAGNOSIS — R600 Localized edema: Secondary | ICD-10-CM | POA: Diagnosis not present

## 2021-12-30 DIAGNOSIS — E1140 Type 2 diabetes mellitus with diabetic neuropathy, unspecified: Secondary | ICD-10-CM | POA: Diagnosis not present

## 2021-12-30 DIAGNOSIS — N189 Chronic kidney disease, unspecified: Secondary | ICD-10-CM | POA: Diagnosis not present

## 2021-12-30 DIAGNOSIS — Z85528 Personal history of other malignant neoplasm of kidney: Secondary | ICD-10-CM | POA: Diagnosis not present

## 2021-12-30 DIAGNOSIS — E785 Hyperlipidemia, unspecified: Secondary | ICD-10-CM | POA: Diagnosis not present

## 2021-12-30 DIAGNOSIS — Z6831 Body mass index (BMI) 31.0-31.9, adult: Secondary | ICD-10-CM | POA: Diagnosis not present

## 2021-12-30 DIAGNOSIS — E1169 Type 2 diabetes mellitus with other specified complication: Secondary | ICD-10-CM | POA: Diagnosis not present

## 2021-12-30 DIAGNOSIS — D649 Anemia, unspecified: Secondary | ICD-10-CM | POA: Diagnosis not present

## 2021-12-30 DIAGNOSIS — I10 Essential (primary) hypertension: Secondary | ICD-10-CM | POA: Diagnosis not present

## 2022-01-05 ENCOUNTER — Encounter (HOSPITAL_COMMUNITY)
Admission: RE | Admit: 2022-01-05 | Discharge: 2022-01-05 | Disposition: A | Discharge status: Home / Self Care | Payer: No Typology Code available for payment source | Source: Ambulatory Visit | Attending: Nephrology | Admitting: Nephrology

## 2022-01-05 DIAGNOSIS — N189 Chronic kidney disease, unspecified: Secondary | ICD-10-CM | POA: Diagnosis not present

## 2022-01-05 DIAGNOSIS — D631 Anemia in chronic kidney disease: Secondary | ICD-10-CM | POA: Insufficient documentation

## 2022-01-05 MED ORDER — SODIUM CHLORIDE 0.9 % IV SOLN
510.0000 mg | INTRAVENOUS | Status: DC
  Administered 2022-01-05: 510 mg via INTRAVENOUS
  Filled 2022-01-05: qty 510

## 2022-01-23 DIAGNOSIS — N189 Chronic kidney disease, unspecified: Secondary | ICD-10-CM | POA: Diagnosis not present

## 2022-01-23 DIAGNOSIS — D649 Anemia, unspecified: Secondary | ICD-10-CM | POA: Diagnosis not present

## 2022-01-26 DIAGNOSIS — L851 Acquired keratosis [keratoderma] palmaris et plantaris: Secondary | ICD-10-CM | POA: Diagnosis not present

## 2022-01-26 DIAGNOSIS — B351 Tinea unguium: Secondary | ICD-10-CM | POA: Diagnosis not present

## 2022-01-26 DIAGNOSIS — E119 Type 2 diabetes mellitus without complications: Secondary | ICD-10-CM | POA: Diagnosis not present

## 2022-01-29 DIAGNOSIS — Z6831 Body mass index (BMI) 31.0-31.9, adult: Secondary | ICD-10-CM | POA: Diagnosis not present

## 2022-01-29 DIAGNOSIS — D649 Anemia, unspecified: Secondary | ICD-10-CM | POA: Diagnosis not present

## 2022-01-29 DIAGNOSIS — E1140 Type 2 diabetes mellitus with diabetic neuropathy, unspecified: Secondary | ICD-10-CM | POA: Diagnosis not present

## 2022-01-29 DIAGNOSIS — R600 Localized edema: Secondary | ICD-10-CM | POA: Diagnosis not present

## 2022-01-29 DIAGNOSIS — E1169 Type 2 diabetes mellitus with other specified complication: Secondary | ICD-10-CM | POA: Diagnosis not present

## 2022-01-29 DIAGNOSIS — I10 Essential (primary) hypertension: Secondary | ICD-10-CM | POA: Diagnosis not present

## 2022-01-29 DIAGNOSIS — E785 Hyperlipidemia, unspecified: Secondary | ICD-10-CM | POA: Diagnosis not present

## 2022-01-29 DIAGNOSIS — Z85528 Personal history of other malignant neoplasm of kidney: Secondary | ICD-10-CM | POA: Diagnosis not present

## 2022-01-29 DIAGNOSIS — N189 Chronic kidney disease, unspecified: Secondary | ICD-10-CM | POA: Diagnosis not present

## 2022-03-09 DIAGNOSIS — N1832 Chronic kidney disease, stage 3b: Secondary | ICD-10-CM | POA: Diagnosis not present

## 2022-03-09 DIAGNOSIS — R5383 Other fatigue: Secondary | ICD-10-CM | POA: Diagnosis not present

## 2022-03-09 DIAGNOSIS — D529 Folate deficiency anemia, unspecified: Secondary | ICD-10-CM | POA: Diagnosis not present

## 2022-03-09 DIAGNOSIS — E1169 Type 2 diabetes mellitus with other specified complication: Secondary | ICD-10-CM | POA: Diagnosis not present

## 2022-03-09 DIAGNOSIS — D649 Anemia, unspecified: Secondary | ICD-10-CM | POA: Diagnosis not present

## 2022-03-09 DIAGNOSIS — E785 Hyperlipidemia, unspecified: Secondary | ICD-10-CM | POA: Diagnosis not present

## 2022-03-17 DIAGNOSIS — R600 Localized edema: Secondary | ICD-10-CM | POA: Diagnosis not present

## 2022-03-17 DIAGNOSIS — N189 Chronic kidney disease, unspecified: Secondary | ICD-10-CM | POA: Diagnosis not present

## 2022-03-17 DIAGNOSIS — Z85528 Personal history of other malignant neoplasm of kidney: Secondary | ICD-10-CM | POA: Diagnosis not present

## 2022-03-17 DIAGNOSIS — R001 Bradycardia, unspecified: Secondary | ICD-10-CM | POA: Diagnosis not present

## 2022-03-17 DIAGNOSIS — E1140 Type 2 diabetes mellitus with diabetic neuropathy, unspecified: Secondary | ICD-10-CM | POA: Diagnosis not present

## 2022-03-17 DIAGNOSIS — D649 Anemia, unspecified: Secondary | ICD-10-CM | POA: Diagnosis not present

## 2022-03-17 DIAGNOSIS — E1169 Type 2 diabetes mellitus with other specified complication: Secondary | ICD-10-CM | POA: Diagnosis not present

## 2022-03-17 DIAGNOSIS — E785 Hyperlipidemia, unspecified: Secondary | ICD-10-CM | POA: Diagnosis not present

## 2022-03-17 DIAGNOSIS — Z6831 Body mass index (BMI) 31.0-31.9, adult: Secondary | ICD-10-CM | POA: Diagnosis not present

## 2022-03-17 DIAGNOSIS — I10 Essential (primary) hypertension: Secondary | ICD-10-CM | POA: Diagnosis not present

## 2022-04-06 DIAGNOSIS — E119 Type 2 diabetes mellitus without complications: Secondary | ICD-10-CM | POA: Diagnosis not present

## 2022-04-06 DIAGNOSIS — N1832 Chronic kidney disease, stage 3b: Secondary | ICD-10-CM | POA: Diagnosis not present

## 2022-04-06 DIAGNOSIS — B351 Tinea unguium: Secondary | ICD-10-CM | POA: Diagnosis not present

## 2022-04-06 DIAGNOSIS — L851 Acquired keratosis [keratoderma] palmaris et plantaris: Secondary | ICD-10-CM | POA: Diagnosis not present

## 2022-04-06 DIAGNOSIS — M2011 Hallux valgus (acquired), right foot: Secondary | ICD-10-CM | POA: Diagnosis not present

## 2022-04-08 DIAGNOSIS — I872 Venous insufficiency (chronic) (peripheral): Secondary | ICD-10-CM | POA: Diagnosis not present

## 2022-04-22 ENCOUNTER — Ambulatory Visit (INDEPENDENT_AMBULATORY_CARE_PROVIDER_SITE_OTHER): Payer: Medicare HMO | Admitting: Internal Medicine

## 2022-04-22 ENCOUNTER — Encounter: Payer: Self-pay | Admitting: Internal Medicine

## 2022-04-22 VITALS — BP 160/73 | HR 66 | Ht 68.0 in | Wt 205.0 lb

## 2022-04-22 DIAGNOSIS — E1122 Type 2 diabetes mellitus with diabetic chronic kidney disease: Secondary | ICD-10-CM | POA: Diagnosis not present

## 2022-04-22 DIAGNOSIS — H4010X0 Unspecified open-angle glaucoma, stage unspecified: Secondary | ICD-10-CM | POA: Insufficient documentation

## 2022-04-22 DIAGNOSIS — I10 Essential (primary) hypertension: Secondary | ICD-10-CM | POA: Diagnosis not present

## 2022-04-22 DIAGNOSIS — Z1321 Encounter for screening for nutritional disorder: Secondary | ICD-10-CM

## 2022-04-22 DIAGNOSIS — K279 Peptic ulcer, site unspecified, unspecified as acute or chronic, without hemorrhage or perforation: Secondary | ICD-10-CM | POA: Diagnosis not present

## 2022-04-22 DIAGNOSIS — Z1329 Encounter for screening for other suspected endocrine disorder: Secondary | ICD-10-CM

## 2022-04-22 DIAGNOSIS — N2889 Other specified disorders of kidney and ureter: Secondary | ICD-10-CM | POA: Diagnosis not present

## 2022-04-22 DIAGNOSIS — E785 Hyperlipidemia, unspecified: Secondary | ICD-10-CM

## 2022-04-22 DIAGNOSIS — Z862 Personal history of diseases of the blood and blood-forming organs and certain disorders involving the immune mechanism: Secondary | ICD-10-CM | POA: Diagnosis not present

## 2022-04-22 DIAGNOSIS — Z8546 Personal history of malignant neoplasm of prostate: Secondary | ICD-10-CM

## 2022-04-22 DIAGNOSIS — Z0001 Encounter for general adult medical examination with abnormal findings: Secondary | ICD-10-CM

## 2022-04-22 DIAGNOSIS — N183 Chronic kidney disease, stage 3 unspecified: Secondary | ICD-10-CM | POA: Insufficient documentation

## 2022-04-22 DIAGNOSIS — M109 Gout, unspecified: Secondary | ICD-10-CM | POA: Diagnosis not present

## 2022-04-22 DIAGNOSIS — N1832 Chronic kidney disease, stage 3b: Secondary | ICD-10-CM

## 2022-04-22 DIAGNOSIS — E1169 Type 2 diabetes mellitus with other specified complication: Secondary | ICD-10-CM | POA: Diagnosis not present

## 2022-04-22 DIAGNOSIS — G4733 Obstructive sleep apnea (adult) (pediatric): Secondary | ICD-10-CM | POA: Insufficient documentation

## 2022-04-22 DIAGNOSIS — Z2821 Immunization not carried out because of patient refusal: Secondary | ICD-10-CM

## 2022-04-22 MED ORDER — ATORVASTATIN CALCIUM 10 MG PO TABS: 10.0000 mg | ORAL_TABLET | Freq: Every day | ORAL | 0 refills | Status: DC

## 2022-04-22 MED ORDER — OLMESARTAN MEDOXOMIL 20 MG PO TABS: 20.0000 mg | ORAL_TABLET | Freq: Every day | ORAL | 2 refills | Status: DC

## 2022-04-22 NOTE — Assessment & Plan Note (Signed)
Previously documented history of iron deficiency anemia, requiring iron infusions. -Repeat labs, including iron studies ordered today.

## 2022-04-22 NOTE — Patient Instructions (Signed)
It was a pleasure to see you today.  Thank you for giving [place] the opportunity to be involved in your care.  Below is a brief recap of your visit and next steps.  We will plan to see you again in 4 weeks.  Summary You have established care today We will check labs and request records from [place] Start olmesartan 20 mg daily for hypertension Atorvastatin has been refilled Follow up in 4 weeks

## 2022-04-22 NOTE — Assessment & Plan Note (Signed)
Currently prescribed atorvastatin 10 mg daily.  Repeat lipid panel ordered today.

## 2022-04-22 NOTE — Assessment & Plan Note (Signed)
Currently prescribed Simbrinza ophthalmic drops for application to both eyes 3 times daily.  This is managed by the [place].

## 2022-04-22 NOTE — Assessment & Plan Note (Signed)
He endorses a history of gout and is currently prescribed febuxostat.  Denies recent gout flares.  No medication changes today.

## 2022-04-22 NOTE — Assessment & Plan Note (Signed)
Previously documented history of PUD.  He is not currently on any antacid medications and is asymptomatic.

## 2022-04-22 NOTE — Assessment & Plan Note (Signed)
Currently prescribed Jardiance 10 mg daily.  Previously intolerant of metformin.  Reports that his last hemoglobin A1c was 6.7. -Repeat A1c and urine microalbumin/creatinine ratio ordered today

## 2022-04-22 NOTE — Assessment & Plan Note (Signed)
History of prostate cancer diagnosed in 2009.  Treated with prostatectomy and radiation.  He is asymptomatic currently.

## 2022-04-22 NOTE — Assessment & Plan Note (Signed)
He is currently prescribed labetalol 100 mg twice daily as well as Lasix 40 mg daily for treatment of hypertension.  His blood pressure today was 163/72 initially and 160/73 on repeat.  He has a prior history of allergies to amlodipine and lisinopril. -Add olmesartan 20 mg daily -Follow-up in 4 weeks for HTN check

## 2022-04-22 NOTE — Progress Notes (Signed)
New Patient Office Visit  Subjective    Patient ID: Todd Gates, male    DOB: [birth year]  Age: 77 y.o. MRN: [id number]  CC:  Chief Complaint  Patient presents with   Establish Care   HPI Todd Gates presents to establish care.  He is a 77 year old male with a past medical history significant for hypertension, prostate cancer s/p prostatectomy and radiation, gout, hyperlipidemia, diabetes mellitus, CKD 3B, PUD, iron deficiency anemia, OSA, and glaucoma.  He has most recently been followed by [facility] in [place], [place].  Todd Gates is also followed by nephrology (Dr. Marval Regal).  He reports feeling well today.  His acute concern is poorly controlled hypertension.  He is otherwise asymptomatic and has no acute concerns to discuss.  Chronic medical conditions and outstanding preventative care items discussed today are individually addressed in A/P below.  Outpatient Encounter Medications as of 04/22/2022  Medication Sig   acetaminophen (TYLENOL) 500 MG tablet Take 500 mg by mouth every 6 (six) hours as needed for mild pain.   Brinzolamide-Brimonidine (SIMBRINZA) 1-0.2 % SUSP Place 1 drop into both eyes in the morning, at noon, and at bedtime.   Calcium Carbonate (CALCIUM 600 PO) Take 600 mg by mouth daily.   empagliflozin (JARDIANCE) 10 MG TABS tablet Take 10 mg by mouth daily.   febuxostat (ULORIC) 40 MG tablet Take 40 mg by mouth daily as needed (gout flares).   furosemide (LASIX) 20 MG tablet Take 40 mg by mouth daily.   labetalol (NORMODYNE) 100 MG tablet Take 1 tablet (100 mg total) by mouth 2 (two) times daily.   olmesartan (BENICAR) 20 MG tablet Take 1 tablet (20 mg total) by mouth daily.   [DISCONTINUED] atorvastatin (LIPITOR) 10 MG tablet Take 10 mg by mouth daily.   [DISCONTINUED] chlorthalidone (HYGROTON) 25 MG tablet Take 25 mg by mouth daily.   [DISCONTINUED] latanoprost (XALATAN) 0.005 % ophthalmic solution Place 1 drop into both eyes at bedtime.   [DISCONTINUED] lisinopril  (ZESTRIL) 40 MG tablet Take 40 mg by mouth daily.   [DISCONTINUED] loratadine (CLARITIN) 10 MG tablet Take 1 tablet (10 mg total) by mouth daily as needed for allergies (facial swelling).   [DISCONTINUED] predniSONE (DELTASONE) 50 MG tablet Take 1 tablet (50 mg total) by mouth daily with breakfast.   [DISCONTINUED] promethazine (PHENERGAN) 12.5 MG tablet Take 1 tablet (12.5 mg total) by mouth every 4 (four) hours as needed for nausea or vomiting.   atorvastatin (LIPITOR) 10 MG tablet Take 1 tablet (10 mg total) by mouth daily.   [DISCONTINUED] docusate sodium (COLACE) 100 MG capsule Take 1 capsule (100 mg total) by mouth 2 (two) times daily.   [DISCONTINUED] hydrALAZINE (APRESOLINE) 10 MG tablet Take 1 tablet (10 mg total) by mouth 3 (three) times daily.   [DISCONTINUED] HYDROcodone-acetaminophen (NORCO) 5-325 MG tablet Take 1-2 tablets by mouth every 6 (six) hours as needed for moderate pain or severe pain.   [DISCONTINUED] metFORMIN (GLUCOPHAGE-XR) 750 MG 24 hr tablet Take 750 mg by mouth daily.   No facility-administered encounter medications on file as of 04/22/2022.    Past Medical History:  Diagnosis Date   Arthritis    Cancer ([place])    prostate; prostatectomy; radiation   Diabetes mellitus without complication ([place])    TYPE 2   Fatty liver    Glaucoma    Both eyes   Gout    HTN (hypertension)    Peptic ulcer disease    Pneumonia    HX OF AS A  CHILD   Sleep apnea    HAS CPAP    Past Surgical History:  Procedure Laterality Date   COLONOSCOPY W/ POLYPECTOMY  02/2020   PROSTATE SURGERY     PROSTATECTOMY  2008   ROBOTIC ASSITED PARTIAL NEPHRECTOMY Right 06/24/2021   Procedure: XI ROBOTIC ASSITED PARTIAL NEPHRECTOMY;  Surgeon: Ceasar Mons, MD;  Location: [facility];  Service: Urology;  Laterality: Right;   TONSILLECTOMY     TOTAL HIP ARTHROPLASTY Left 10/15/2020   Procedure: LEFT TOTAL HIP ARTHROPLASTY ANTERIOR APPROACH;  Surgeon: Mcarthur Rossetti, MD;  Location:  [facility];  Service: Orthopedics;  Laterality: Left;    Family History  Problem Relation Age of Onset   Hypertension Unknown     Social History   Socioeconomic History   Marital status: Married    Spouse name: Not on file   Number of children: Not on file   Years of education: Not on file   Highest education level: Not on file  Occupational History   Not on file  Tobacco Use   Smoking status: Former    Types: Cigarettes   Smokeless tobacco: Never  Vaping Use   Vaping Use: Not on file  Substance and Sexual Activity   Alcohol use: No   Drug use: No   Sexual activity: Not on file  Other Topics Concern   Not on file  Social History Narrative   Not on file   Social Determinants of Health   Financial Resource Strain: Not on file  Food Insecurity: No Food Insecurity (10/08/2021)   Hunger Vital Sign    Worried About Running Out of Food in the Last Year: Never true    Ran Out of Food in the Last Year: Never true  Transportation Needs: No Transportation Needs (10/08/2021)   PRAPARE - Hydrologist (Medical): No    Lack of Transportation (Non-Medical): No  Physical Activity: Not on file  Stress: Not on file  Social Connections: Not on file  Intimate Partner Violence: Not on file   Review of Systems  Constitutional:  Negative for chills and fever.  HENT:  Negative for sore throat.   Respiratory:  Negative for cough and shortness of breath.   Cardiovascular:  Negative for chest pain, palpitations and leg swelling.  Gastrointestinal:  Negative for abdominal pain, blood in stool, constipation, diarrhea, nausea and vomiting.  Genitourinary:  Negative for dysuria and hematuria.  Musculoskeletal:  Negative for myalgias.  Skin:  Negative for itching and rash.  Neurological:  Negative for dizziness and headaches.  Psychiatric/Behavioral:  Negative for depression and suicidal ideas.    Objective    BP (!) 160/73   Pulse 66   Ht '5\' 8"'$  (1.727 m)   Wt  205 lb (93 kg)   SpO2 95%   BMI 31.17 kg/m   Physical Exam Vitals reviewed.  Constitutional:      General: He is not in acute distress.    Appearance: Normal appearance. He is obese. He is not ill-appearing.  HENT:     Head: Normocephalic and atraumatic.     Right Ear: External ear normal.     Left Ear: External ear normal.     Nose: Nose normal. No congestion or rhinorrhea.     Mouth/Throat:     Mouth: Mucous membranes are moist.     Pharynx: Oropharynx is clear.  Eyes:     General: No scleral icterus.    Extraocular Movements: Extraocular movements intact.  Conjunctiva/sclera: Conjunctivae normal.     Pupils: Pupils are equal, round, and reactive to light.  Cardiovascular:     Rate and Rhythm: Normal rate and regular rhythm.     Pulses: Normal pulses.     Heart sounds: Murmur heard.  Pulmonary:     Effort: Pulmonary effort is normal.     Breath sounds: Normal breath sounds. No wheezing, rhonchi or rales.  Abdominal:     General: Abdomen is flat. Bowel sounds are normal. There is no distension.     Palpations: Abdomen is soft.     Tenderness: There is no abdominal tenderness.     Hernia: A hernia (Reducible ventral hernia) is present.  Musculoskeletal:        General: No swelling or deformity. Normal range of motion.     Cervical back: Normal range of motion.  Skin:    General: Skin is warm and dry.     Capillary Refill: Capillary refill takes less than 2 seconds.  Neurological:     General: No focal deficit present.     Mental Status: He is alert and oriented to person, place, and time.     Motor: No weakness.  Psychiatric:        Mood and Affect: Mood normal.        Behavior: Behavior normal.        Thought Content: Thought content normal.    Assessment & Plan:   Problem List Items Addressed This Visit       Essential hypertension    He is currently prescribed labetalol 100 mg twice daily as well as Lasix 40 mg daily for treatment of hypertension.  His  blood pressure today was 163/72 initially and 160/73 on repeat.  He has a prior history of allergies to amlodipine and lisinopril. -Add olmesartan 20 mg daily -Follow-up in 4 weeks for HTN check      OSA on CPAP    Previously documented history of OSA.  He endorses nightly compliance with CPAP.      PUD (peptic ulcer disease)    Previously documented history of PUD.  He is not currently on any antacid medications and is asymptomatic.      Hyperlipidemia associated with type 2 diabetes mellitus ([place])    Currently prescribed atorvastatin 10 mg daily.  Repeat lipid panel ordered today.      Type 2 diabetes mellitus with diabetic chronic kidney disease ([place])    Currently prescribed Jardiance 10 mg daily.  Previously intolerant of metformin.  Reports that his last hemoglobin A1c was 6.7. -Repeat A1c and urine microalbumin/creatinine ratio ordered today      CKD (chronic kidney disease) stage 3, GFR 30-59 ml/min (HCC)    History of CKD stage IIIb.  Followed by nephrology (Dr. Marval Regal). -Repeat labs ordered today      Right kidney mass    Underwent right partial nephrectomy in May 2023. 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  Margins were negative. -Urology follow-up scheduled for September 2024      History of prostate cancer    History of prostate cancer diagnosed in 2009.  Treated with prostatectomy and radiation.  He is asymptomatic currently.      Gout    He endorses a history of gout and is currently prescribed febuxostat.  Denies recent gout flares.  No medication changes today.      History of iron deficiency anemia    Previously documented history of iron deficiency anemia,  requiring iron infusions. -Repeat labs, including iron studies ordered today.      Bilateral open angle glaucoma    Currently prescribed Simbrinza ophthalmic drops for application to both eyes 3 times daily.  This is managed by the [place].      Encounter for  general adult medical examination with abnormal findings - Primary    Presenting today to establish care.  Available records and labs have been reviewed. -Repeat labs ordered today -Records from [facility] have been requested -Follow-up in 4 weeks for HTN check and will address outstanding preventative care items at that time.      Return in about 4 weeks (around 05/20/2022) for HTN, review labs.   Johnette Abraham, MD

## 2022-04-22 NOTE — Assessment & Plan Note (Signed)
History of CKD stage IIIb.  Followed by nephrology (Dr. Marval Regal). -Repeat labs ordered today

## 2022-04-22 NOTE — Assessment & Plan Note (Signed)
Presenting today to establish care.  Available records and labs have been reviewed. -Repeat labs ordered today -Records from [facility] have been requested -Follow-up in 4 weeks for HTN check and will address outstanding preventative care items at that time.

## 2022-04-22 NOTE — Assessment & Plan Note (Signed)
Previously documented history of OSA.  He endorses nightly compliance with CPAP.

## 2022-04-22 NOTE — Assessment & Plan Note (Signed)
Underwent right partial nephrectomy in May 2023. 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  Margins were negative. -Urology follow-up scheduled for September 2024

## 2022-04-24 LAB — CMP14+EGFR
ALT: 13 IU/L (ref 0–44)
AST: 22 IU/L (ref 0–40)
Albumin/Globulin Ratio: 1.9 (ref 1.2–2.2)
Albumin: 5 g/dL — ABNORMAL HIGH (ref 3.8–4.8)
Alkaline Phosphatase: 107 IU/L (ref 44–121)
BUN/Creatinine Ratio: 14 (ref 10–24)
BUN: 29 mg/dL — ABNORMAL HIGH (ref 8–27)
Bilirubin Total: 0.4 mg/dL (ref 0.0–1.2)
CO2: 21 mmol/L (ref 20–29)
Calcium: 10.3 mg/dL — ABNORMAL HIGH (ref 8.6–10.2)
Chloride: 106 mmol/L (ref 96–106)
Creatinine, Ser: 2.02 mg/dL — ABNORMAL HIGH (ref 0.76–1.27)
Globulin, Total: 2.6 g/dL (ref 1.5–4.5)
Glucose: 105 mg/dL — ABNORMAL HIGH (ref 70–99)
Potassium: 3.9 mmol/L (ref 3.5–5.2)
Sodium: 143 mmol/L (ref 134–144)
Total Protein: 7.6 g/dL (ref 6.0–8.5)
eGFR: 33 mL/min/{1.73_m2} — ABNORMAL LOW (ref >59)

## 2022-04-24 LAB — HEMOGLOBIN A1C
Est. average glucose Bld gHb Est-mCnc: 166 mg/dL
Hgb A1c MFr Bld: 7.4 % — ABNORMAL HIGH (ref 4.8–5.6)

## 2022-04-24 LAB — CBC WITH DIFFERENTIAL/PLATELET
Basophils Absolute: 0.1 10*3/uL (ref 0.0–0.2)
Basos: 1 %
EOS (ABSOLUTE): 0.4 10*3/uL (ref 0.0–0.4)
Eos: 4 %
Hematocrit: 36.3 % — ABNORMAL LOW (ref 37.5–51.0)
Hemoglobin: 12.2 g/dL — ABNORMAL LOW (ref 13.0–17.7)
Immature Grans (Abs): 0 10*3/uL (ref 0.0–0.1)
Immature Granulocytes: 0 %
Lymphocytes Absolute: 2.5 10*3/uL (ref 0.7–3.1)
Lymphs: 29 %
MCH: 30 pg (ref 26.6–33.0)
MCHC: 33.6 g/dL (ref 31.5–35.7)
MCV: 89 fL (ref 79–97)
Monocytes Absolute: 0.8 10*3/uL (ref 0.1–0.9)
Monocytes: 9 %
Neutrophils Absolute: 4.9 10*3/uL (ref 1.4–7.0)
Neutrophils: 57 %
Platelets: 235 10*3/uL (ref 150–450)
RBC: 4.07 x10E6/uL — ABNORMAL LOW (ref 4.14–5.80)
RDW: 14.1 % (ref 11.6–15.4)
WBC: 8.6 10*3/uL (ref 3.4–10.8)

## 2022-04-24 LAB — LIPID PANEL
Chol/HDL Ratio: 3.5 ratio (ref 0.0–5.0)
Cholesterol, Total: 174 mg/dL (ref 100–199)
HDL: 50 mg/dL (ref >39)
LDL Chol Calc (NIH): 89 mg/dL (ref 0–99)
Triglycerides: 207 mg/dL — ABNORMAL HIGH (ref 0–149)
VLDL Cholesterol Cal: 35 mg/dL (ref 5–40)

## 2022-04-24 LAB — MICROALBUMIN / CREATININE URINE RATIO
Creatinine, Urine: 44.1 mg/dL
Microalb/Creat Ratio: 27 mg/g creat (ref 0–29)
Microalbumin, Urine: 12.1 ug/mL

## 2022-04-24 LAB — TSH+FREE T4
Free T4: 1.15 ng/dL (ref 0.82–1.77)
TSH: 1.37 u[IU]/mL (ref 0.450–4.500)

## 2022-04-24 LAB — IRON,TIBC AND FERRITIN PANEL
Ferritin: 748 ng/mL — ABNORMAL HIGH (ref 30–400)
Iron Saturation: 19 % (ref 15–55)
Iron: 60 ug/dL (ref 38–169)
Total Iron Binding Capacity: 321 ug/dL (ref 250–450)
UIBC: 261 ug/dL (ref 111–343)

## 2022-04-24 LAB — B12 AND FOLATE PANEL
Folate: >20 ng/mL (ref >3.0)
Vitamin B-12: 328 pg/mL (ref 232–1245)

## 2022-04-24 LAB — VITAMIN D 25 HYDROXY (VIT D DEFICIENCY, FRACTURES): Vit D, 25-Hydroxy: 33.9 ng/mL (ref 30.0–100.0)

## 2022-05-20 ENCOUNTER — Encounter: Payer: Self-pay | Admitting: Internal Medicine

## 2022-05-20 ENCOUNTER — Ambulatory Visit (INDEPENDENT_AMBULATORY_CARE_PROVIDER_SITE_OTHER): Payer: Medicare HMO | Admitting: Internal Medicine

## 2022-05-20 VITALS — BP 120/68 | HR 57 | Ht 68.0 in | Wt 204.4 lb

## 2022-05-20 DIAGNOSIS — E1169 Type 2 diabetes mellitus with other specified complication: Secondary | ICD-10-CM | POA: Diagnosis not present

## 2022-05-20 DIAGNOSIS — E1122 Type 2 diabetes mellitus with diabetic chronic kidney disease: Secondary | ICD-10-CM | POA: Diagnosis not present

## 2022-05-20 DIAGNOSIS — N1832 Chronic kidney disease, stage 3b: Secondary | ICD-10-CM

## 2022-05-20 DIAGNOSIS — I10 Essential (primary) hypertension: Secondary | ICD-10-CM

## 2022-05-20 DIAGNOSIS — E785 Hyperlipidemia, unspecified: Secondary | ICD-10-CM

## 2022-05-20 MED ORDER — EMPAGLIFLOZIN 25 MG PO TABS: 25.0000 mg | ORAL_TABLET | Freq: Every day | ORAL | 2 refills | Status: DC

## 2022-05-20 MED ORDER — ATORVASTATIN CALCIUM 40 MG PO TABS: 40.0000 mg | ORAL_TABLET | Freq: Every day | ORAL | 3 refills | Status: DC

## 2022-05-20 NOTE — Assessment & Plan Note (Signed)
Currently prescribed atorvastatin 10 mg daily.  Lipid panel updated last month.  Total cholesterol 174 and LDL 89.  His 10-year ASCVD risk today is 39.9%. -Increase atorvastatin 40 mg daily

## 2022-05-20 NOTE — Patient Instructions (Signed)
It was a pleasure to see you today.  Thank you for giving [place] the opportunity to be involved in your care.  Below is a brief recap of your visit and next steps.  We will plan to see you again in 3 months.  Summary Increase atorvastatin to 40 mg daily Increase Jardiance to 25 mg daily Follow up in 3 months

## 2022-05-20 NOTE — Assessment & Plan Note (Addendum)
A1c 7.4 on labs from last month.  He is currently prescribed Jardiance 10 mg daily.  Previously intolerant of metformin. -Increase Jardiance to 25 mg daily for improved glycemic control -We reviewed the importance of dietary modifications aimed at improving his blood sugar as well

## 2022-05-20 NOTE — Assessment & Plan Note (Signed)
Presenting today for HTN follow-up.  Olmesartan 20 mg daily was added to his antihypertensive regimen at his last appointment.  He is additionally prescribed labetalol 100 mg twice daily and Lasix 40 mg daily.  BP today was 131/54 initially and 120/58 on repeat. -No additional medication change today.  Continue current regimen.

## 2022-05-20 NOTE — Progress Notes (Signed)
Established Patient Office Visit  Subjective   Patient ID: Todd Gates, male    DOB: September 12, 1945  Age: 77 y.o. MRN: [id number]  Chief Complaint  Patient presents with   Hypertension    Follow up   Todd Gates returns to care today for HTN follow-up and lab review.  He was last evaluated by me on 2/28 as a new patient presenting to establish care.  Olmesartan 20 mg daily was added to his antihypertensive regimen and baseline labs were ordered.  There have been no acute interval events.  Todd Gates reports feeling well today.  He has no acute concerns to discuss.  Past Medical History:  Diagnosis Date   Arthritis    Cancer [facility])    prostate; prostatectomy; radiation   Diabetes mellitus without complication ([place])    TYPE 2   Fatty liver    Glaucoma    Both eyes   Gout    HTN (hypertension)    Peptic ulcer disease    Pneumonia    HX OF AS A CHILD   Sleep apnea    HAS CPAP   Past Surgical History:  Procedure Laterality Date   COLONOSCOPY W/ POLYPECTOMY  02/2020   PROSTATE SURGERY     PROSTATECTOMY  2008   ROBOTIC ASSITED PARTIAL NEPHRECTOMY Right 06/24/2021   Procedure: XI ROBOTIC ASSITED PARTIAL NEPHRECTOMY;  Surgeon: Ceasar Mons, MD;  Location: [facility];  Service: Urology;  Laterality: Right;   TONSILLECTOMY     TOTAL HIP ARTHROPLASTY Left 10/15/2020   Procedure: LEFT TOTAL HIP ARTHROPLASTY ANTERIOR APPROACH;  Surgeon: Mcarthur Rossetti, MD;  Location: [facility];  Service: Orthopedics;  Laterality: Left;   Social History   Tobacco Use   Smoking status: Former    Types: Cigarettes   Smokeless tobacco: Never  Substance Use Topics   Alcohol use: No   Drug use: No   Family History  Problem Relation Age of Onset   Hypertension Unknown    Allergies  Allergen Reactions   Feraheme [Ferumoxytol] Swelling    Facial swelling   Review of Systems  Constitutional:  Negative for chills and fever.  HENT:  Negative for sore throat.   Respiratory:  Negative for  cough and shortness of breath.   Cardiovascular:  Negative for chest pain, palpitations and leg swelling.  Gastrointestinal:  Negative for abdominal pain, blood in stool, constipation, diarrhea, nausea and vomiting.  Genitourinary:  Negative for dysuria and hematuria.  Musculoskeletal:  Negative for myalgias.  Skin:  Negative for itching and rash.  Neurological:  Negative for dizziness and headaches.  Psychiatric/Behavioral:  Negative for depression and suicidal ideas.      Objective:     BP 120/68   Pulse (!) 57   Ht 5\' 8"  (1.727 m)   Wt 204 lb 6.4 oz (92.7 kg)   SpO2 96%   BMI 31.08 kg/m  BP Readings from Last 3 Encounters:  05/20/22 120/68  04/22/22 (!) 160/73  01/05/22 (!) 127/53   Physical Exam Vitals reviewed.  Constitutional:      General: He is not in acute distress.    Appearance: Normal appearance. He is obese. He is not ill-appearing.  HENT:     Head: Normocephalic and atraumatic.     Right Ear: External ear normal.     Left Ear: External ear normal.     Nose: Nose normal. No congestion or rhinorrhea.     Mouth/Throat:     Mouth: Mucous membranes are moist.  Pharynx: Oropharynx is clear.  Eyes:     General: No scleral icterus.    Extraocular Movements: Extraocular movements intact.     Conjunctiva/sclera: Conjunctivae normal.     Pupils: Pupils are equal, round, and reactive to light.  Cardiovascular:     Rate and Rhythm: Normal rate and regular rhythm.     Pulses: Normal pulses.     Heart sounds: Murmur heard.  Pulmonary:     Effort: Pulmonary effort is normal.     Breath sounds: Normal breath sounds. No wheezing, rhonchi or rales.  Abdominal:     General: Abdomen is flat. Bowel sounds are normal. There is no distension.     Palpations: Abdomen is soft.     Tenderness: There is no abdominal tenderness.     Hernia: A hernia (Reducible ventral hernia) is present.  Musculoskeletal:        General: No swelling or deformity. Normal range of motion.      Cervical back: Normal range of motion.  Skin:    General: Skin is warm and dry.     Capillary Refill: Capillary refill takes less than 2 seconds.  Neurological:     General: No focal deficit present.     Mental Status: He is alert and oriented to person, place, and time.     Motor: No weakness.  Psychiatric:        Mood and Affect: Mood normal.        Behavior: Behavior normal.        Thought Content: Thought content normal.   Last CBC Lab Results  Component Value Date   WBC 8.6 04/22/2022   HGB 12.2 (L) 04/22/2022   HCT 36.3 (L) 04/22/2022   MCV 89 04/22/2022   MCH 30.0 04/22/2022   RDW 14.1 04/22/2022   PLT 235 [id number]   Last metabolic panel Lab Results  Component Value Date   GLUCOSE 105 (H) 04/22/2022   NA 143 04/22/2022   K 3.9 04/22/2022   CL 106 04/22/2022   CO2 21 04/22/2022   BUN 29 (H) 04/22/2022   CREATININE 2.02 (H) 04/22/2022   EGFR 33 (L) 04/22/2022   CALCIUM 10.3 (H) 04/22/2022   PROT 7.6 04/22/2022   ALBUMIN 5.0 (H) 04/22/2022   LABGLOB 2.6 04/22/2022   AGRATIO 1.9 04/22/2022   BILITOT 0.4 04/22/2022   ALKPHOS 107 04/22/2022   AST 22 04/22/2022   ALT 13 04/22/2022   ANIONGAP 9 12/27/2021   Last lipids Lab Results  Component Value Date   CHOL 174 04/22/2022   HDL 50 04/22/2022   LDLCALC 89 04/22/2022   TRIG 207 (H) 04/22/2022   CHOLHDL 3.5 04/22/2022   Last hemoglobin A1c Lab Results  Component Value Date   HGBA1C 7.4 (H) 04/22/2022   Last thyroid functions Lab Results  Component Value Date   TSH 1.370 04/22/2022   Last vitamin D Lab Results  Component Value Date   VD25OH 33.9 04/22/2022   Last vitamin B12 and Folate Lab Results  Component Value Date   VITAMINB12 328 04/22/2022   FOLATE >20.0 04/22/2022   The 10-year ASCVD risk score (Arnett DK, et al., 2019) is: 35%    Assessment & Plan:   Problem List Items Addressed This Visit       Essential hypertension    Presenting today for HTN follow-up.  Olmesartan 20  mg daily was added to his antihypertensive regimen at his last appointment.  He is additionally prescribed labetalol 100 mg twice daily and  Lasix 40 mg daily.  BP today was 131/54 initially and 120/58 on repeat. -No additional medication change today.  Continue current regimen.      Hyperlipidemia associated with type 2 diabetes mellitus ([place])    Currently prescribed atorvastatin 10 mg daily.  Lipid panel updated last month.  Total cholesterol 174 and LDL 89.  His 10-year ASCVD risk today is 39.9%. -Increase atorvastatin 40 mg daily      Type 2 diabetes mellitus with diabetic chronic kidney disease ([place]) - Primary    A1c 7.4 on labs from last month.  He is currently prescribed Jardiance 10 mg daily.  Previously intolerant of metformin. -Increase Jardiance to 25 mg daily for improved glycemic control -We reviewed the importance of dietary modifications aimed at improving his blood sugar as well -Diabetic foot and eye exams are scheduled with podiatry and ophthalmology respectively next month       Return in about 3 months (around 08/20/2022).    Johnette Abraham, MD

## 2022-06-03 ENCOUNTER — Ambulatory Visit (INDEPENDENT_AMBULATORY_CARE_PROVIDER_SITE_OTHER): Payer: Medicare HMO | Admitting: Internal Medicine

## 2022-06-03 ENCOUNTER — Encounter: Payer: Self-pay | Admitting: Internal Medicine

## 2022-06-03 DIAGNOSIS — Z0000 Encounter for general adult medical examination without abnormal findings: Secondary | ICD-10-CM

## 2022-06-03 NOTE — Patient Instructions (Addendum)
  Todd Gates , Thank you for taking time to come for your Medicare Wellness Visit. I appreciate your ongoing commitment to your health goals. Please review the following plan we discussed and let me know if I can assist you in the future.   These are the goals we discussed: Patient would like to stay active. He may need hip surgery. He is doing light weight lifting at gym and will continue this.   Obtain Vaccine records [facility]   This is a list of the screening recommended for you and due dates:  Health Maintenance  Topic Date Due   COVID-19 Vaccine (1) Never done   Complete foot exam   Never done   Eye exam for diabetics  Never done   Hepatitis C Screening: USPSTF Recommendation to screen - Ages 43-79 yo.  Never done   DTaP/Tdap/Td vaccine (1 - Tdap) Never done   Zoster (Shingles) Vaccine (1 of 2) Never done   Medicare Annual Wellness Visit  07/10/2022   Pneumonia Vaccine (1 of 1 - PCV) 05/19/2023*   Flu Shot  09/24/2022   Hemoglobin A1C  10/21/2022   Yearly kidney function blood test for diabetes  04/23/2023   Yearly kidney health urinalysis for diabetes  04/23/2023   HPV Vaccine  Aged Out  *Topic was postponed. The date shown is not the original due date.

## 2022-06-03 NOTE — Progress Notes (Signed)
Subjective:  This is a telephone encounter between Trenda Moots and Milus Banister on 06/03/2022 for AWV. The visit was conducted with the patient located at home and Milus Banister at [facility]. The patient's identity was confirmed using their DOB and current address. The patient has consented to being evaluated through a telephone encounter and understands the associated risks (an examination cannot be done and the patient may need to come in for an appointment) / benefits (allows the patient to remain at home, decreasing exposure to coronavirus).     Akshaj Besancon is a 77 y.o. male who presents for Medicare Annual/Subsequent preventive examination.  Review of Systems    Review of Systems  All other systems reviewed and are negative.    Objective:    There were no vitals filed for this visit. There is no height or weight on file to calculate BMI.     06/03/2022    3:48 PM 12/27/2021   10:04 AM 06/24/2021    6:09 AM 06/11/2021    1:52 PM 04/18/2021    9:16 AM 04/08/2021   11:18 AM 04/01/2021   11:34 AM  Advanced Directives  Does Patient Have a Medical Advance Directive? No No No No No No No  Would patient like information on creating a medical advance directive? No - Patient declined  No - Patient declined  No - Patient declined No - Patient declined No - Patient declined    Current Medications (verified) Outpatient Encounter Medications as of 06/03/2022  Medication Sig   acetaminophen (TYLENOL) 500 MG tablet Take 500 mg by mouth every 6 (six) hours as needed for mild pain.   atorvastatin (LIPITOR) 40 MG tablet Take 1 tablet (40 mg total) by mouth daily.   Brinzolamide-Brimonidine (SIMBRINZA) 1-0.2 % SUSP Place 1 drop into both eyes in the morning, at noon, and at bedtime.   Calcium Carbonate (CALCIUM 600 PO) Take 600 mg by mouth daily.   empagliflozin (JARDIANCE) 25 MG TABS tablet Take 1 tablet (25 mg total) by mouth daily before breakfast.   febuxostat (ULORIC) 40 MG tablet Take 40 mg by  mouth daily as needed (gout flares).   labetalol (NORMODYNE) 100 MG tablet Take 1 tablet (100 mg total) by mouth 2 (two) times daily.   olmesartan (BENICAR) 20 MG tablet Take 1 tablet (20 mg total) by mouth daily.   [DISCONTINUED] furosemide (LASIX) 20 MG tablet Take 40 mg by mouth daily.   No facility-administered encounter medications on file as of 06/03/2022.    Allergies (verified) Feraheme [ferumoxytol]   History: Past Medical History:  Diagnosis Date   Arthritis    Cancer ([facility])    prostate; prostatectomy; radiation   Diabetes mellitus without complication ([facility])    TYPE 2   Fatty liver    Glaucoma    Both eyes   Gout    HTN (hypertension)    Peptic ulcer disease    Pneumonia    HX OF AS A CHILD   Sleep apnea    HAS CPAP   Past Surgical History:  Procedure Laterality Date   COLONOSCOPY W/ POLYPECTOMY  02/2020   PROSTATE SURGERY     PROSTATECTOMY  2008   ROBOTIC ASSITED PARTIAL NEPHRECTOMY Right 06/24/2021   Procedure: XI ROBOTIC ASSITED PARTIAL NEPHRECTOMY;  Surgeon: Rene Paci, MD;  [facility];  Service: Urology;  Laterality: Right;   TONSILLECTOMY     TOTAL HIP ARTHROPLASTY Left 10/15/2020   Procedure: LEFT TOTAL HIP ARTHROPLASTY ANTERIOR APPROACH;  Surgeon:  Kathryne HitchBlackman, Christopher Y, MD;  [facility];  Service: Orthopedics;  Laterality: Left;   Family History  Problem Relation Age of Onset   Hypertension Unknown    Social History   Socioeconomic History   Marital status: Married    Spouse name: Not on file   Number of children: Not on file   Years of education: Not on file   Highest education level: Not on file  Occupational History   Not on file  Tobacco Use   Smoking status: Former    Types: Cigarettes   Smokeless tobacco: Never  Vaping Use   Vaping Use: Not on file  Substance and Sexual Activity   Alcohol use: No   Drug use: No   Sexual activity: Not on file  Other Topics Concern   Not on file  Social History Narrative    Not on file   Social Determinants of Health   Financial Resource Strain: Not on file  Food Insecurity: No Food Insecurity (10/08/2021)   Hunger Vital Sign    Worried About Running Out of Food in the Last Year: Never true    Ran Out of Food in the Last Year: Never true  Transportation Needs: No Transportation Needs (10/08/2021)   PRAPARE - Administrator, Civil ServiceTransportation    Lack of Transportation (Medical): No    Lack of Transportation (Non-Medical): No  Physical Activity: Not on file  Stress: Not on file  Social Connections: Not on file    Tobacco Counseling Counseling given: Not Answered   Clinical Intake:  Pre-visit preparation completed: Yes  Pain : No/denies pain     Diabetes: No  How often do you need to have someone help you when you read instructions, pamphlets, or other written materials from your doctor or pharmacy?: 1 - Never What is the last grade level you completed in school?: 2 years of college  Diabetic?No         Activities of Daily Living    06/03/2022    3:50 PM 06/24/2021    8:16 PM  In your present state of health, do you have any difficulty performing the following activities:  Hearing? 0   Vision? 0   Difficulty concentrating or making decisions? 0   Walking or climbing stairs? 0   Dressing or bathing? 0   Doing errands, shopping? 0 0    Patient Care Team: Billie Ladeixon, Phillip E, MD as PCP - General (Internal Medicine) Doreatha MassedKatragadda, Sreedhar, MD as Medical Oncologist (Medical Oncology)  Indicate any recent Medical Services you may have received from other than [facility] providers in the past year (date may be approximate).     Assessment:   This is a routine wellness examination for [place].  Hearing/Vision screen No results found.  Dietary issues and exercise activities discussed:     Goals Addressed   None    Depression Screen    06/03/2022    3:49 PM 05/20/2022    1:45 PM 04/22/2022    2:14 PM  PHQ 2/9 Scores  PHQ - 2 Score 0 0 1  PHQ- 9 Score  0  1    Fall Risk    06/03/2022    3:49 PM 05/20/2022    1:45 PM 04/22/2022    2:14 PM  Fall Risk   Falls in the past year? 0 0 0  Number falls in past yr:  0 0  Injury with Fall?  0 0  Risk for fall due to :  No Fall Risks No Fall Risks  Follow up  Falls evaluation completed Falls evaluation completed    FALL RISK PREVENTION PERTAINING TO THE HOME:  Any stairs in or around the home? Yes  If so, are there any without handrails? Yes  Home free of loose throw rugs in walkways, pet beds, electrical cords, etc? Yes  Adequate lighting in your home to reduce risk of falls? Yes   ASSISTIVE DEVICES UTILIZED TO PREVENT FALLS:  Life alert? No  Use of a cane, walker or w/c? No  Grab bars in the bathroom? Yes  Shower chair or bench in shower? No  Elevated toilet seat or a handicapped toilet? Yes   Cognitive Function:        06/03/2022    3:50 PM  6CIT Screen  What Year? 0 points  What month? 0 points  What time? 0 points  Count back from 20 0 points  Months in reverse 0 points  Repeat phrase 0 points  Total Score 0 points    Immunizations: Patient may have received many of these vaccines but we do not have record. He is going to go to [facility] store and to [place] to obtain records. He will bring these by the office or have them faxed so we can give recommendations.   There is no immunization history on file for this patient.  TDAP status: Due, Education has been provided regarding the importance of this vaccine. Advised may receive this vaccine at local pharmacy or Health Dept. Aware to provide a copy of the vaccination record if obtained from local pharmacy or Health Dept. Verbalized acceptance and understanding.  Flu Vaccine status: Due, Education has been provided regarding the importance of this vaccine. Advised may receive this vaccine at local pharmacy or Health Dept. Aware to provide a copy of the vaccination record if obtained from local pharmacy or Health Dept. Verbalized  acceptance and understanding.  Pneumococcal vaccine status: Due, Education has been provided regarding the importance of this vaccine. Advised may receive this vaccine at local pharmacy or Health Dept. Aware to provide a copy of the vaccination record if obtained from local pharmacy or Health Dept. Verbalized acceptance and understanding.  Covid-19 vaccine status: Information provided on how to obtain vaccines.   Qualifies for Shingles Vaccine? Yes   Zostavax completed No   Shingrix Completed?: No.    Education has been provided regarding the importance of this vaccine. Patient has been advised to call insurance company to determine out of pocket expense if they have not yet received this vaccine. Advised may also receive vaccine at local pharmacy or Health Dept. Verbalized acceptance and understanding.  Screening Tests Health Maintenance  Topic Date Due   COVID-19 Vaccine (1) Never done   FOOT EXAM  Never done   OPHTHALMOLOGY EXAM  Never done   Hepatitis C Screening  Never done   DTaP/Tdap/Td (1 - Tdap) Never done   Zoster Vaccines- Shingrix (1 of 2) Never done   Medicare Annual Wellness (AWV)  07/10/2022   Pneumonia Vaccine 21+ Years old (1 of 1 - PCV) 05/19/2023 (Originally 03/27/2010)   INFLUENZA VACCINE  09/24/2022   HEMOGLOBIN A1C  10/21/2022   Diabetic kidney evaluation - eGFR measurement  04/23/2023   Diabetic kidney evaluation - Urine ACR  04/23/2023   HPV VACCINES  Aged Out    Health Maintenance  Health Maintenance Due  Topic Date Due   COVID-19 Vaccine (1) Never done   FOOT EXAM  Never done   OPHTHALMOLOGY EXAM  Never done  Hepatitis C Screening  Never done   DTaP/Tdap/Td (1 - Tdap) Never done   Zoster Vaccines- Shingrix (1 of 2) Never done   Medicare Annual Wellness (AWV)  07/10/2022    Colorectal cancer screening: No longer required.   Lung Cancer Screening: (Low Dose CT Chest recommended if Age 3-80 years, 30 pack-year currently smoking OR have quit w/in  15years.) does not qualify.   Additional Screening:  Hepatitis C Screening: does qualify; Can complete with routine blood work   Vision Screening: Recommended annual ophthalmology exams for early detection of glaucoma and other disorders of the eye. Is the patient up to date with their annual eye exam?  Yes  Who is the provider or what is the name of the office in which the patient attends [facility] exams? [facility] If pt is not established with a provider, would they like to be referred to a provider to establish care? No .   Dental Screening: Recommended annual dental exams for proper oral hygiene  Community Resource Referral / Chronic Care Management: CRR required this visit?  No   CCM required this visit?  No      Plan:     I have personally reviewed and noted the following in the patient's chart:   Medical and social history Use of alcohol, tobacco or illicit drugs  Current medications and supplements including opioid prescriptions. Patient is not currently taking opioid prescriptions. Functional ability and status Nutritional status Physical activity Advanced directives List of other physicians Hospitalizations, surgeries, and ER visits in previous 12 months Vitals Screenings to include cognitive, depression, and falls Referrals and appointments  In addition, I have reviewed and discussed with patient certain preventive protocols, quality metrics, and best practice recommendations. A written personalized care plan for preventive services as well as general preventive health recommendations were provided to patient.     Milus Banister, MD   06/03/2022

## 2022-06-29 DIAGNOSIS — E119 Type 2 diabetes mellitus without complications: Secondary | ICD-10-CM | POA: Diagnosis not present

## 2022-06-29 DIAGNOSIS — M2011 Hallux valgus (acquired), right foot: Secondary | ICD-10-CM | POA: Diagnosis not present

## 2022-06-29 DIAGNOSIS — L851 Acquired keratosis [keratoderma] palmaris et plantaris: Secondary | ICD-10-CM | POA: Diagnosis not present

## 2022-06-29 DIAGNOSIS — B351 Tinea unguium: Secondary | ICD-10-CM | POA: Diagnosis not present

## 2022-07-02 ENCOUNTER — Other Ambulatory Visit: Payer: Self-pay | Admitting: Internal Medicine

## 2022-07-02 DIAGNOSIS — I10 Essential (primary) hypertension: Secondary | ICD-10-CM

## 2022-07-08 ENCOUNTER — Ambulatory Visit (INDEPENDENT_AMBULATORY_CARE_PROVIDER_SITE_OTHER): Payer: Medicare HMO

## 2022-07-08 ENCOUNTER — Ambulatory Visit (INDEPENDENT_AMBULATORY_CARE_PROVIDER_SITE_OTHER): Payer: No Typology Code available for payment source | Admitting: Physical Medicine and Rehabilitation

## 2022-07-08 ENCOUNTER — Other Ambulatory Visit: Payer: Self-pay

## 2022-07-08 ENCOUNTER — Ambulatory Visit (INDEPENDENT_AMBULATORY_CARE_PROVIDER_SITE_OTHER): Payer: No Typology Code available for payment source | Admitting: Orthopaedic Surgery

## 2022-07-08 DIAGNOSIS — M79604 Pain in right leg: Secondary | ICD-10-CM

## 2022-07-08 DIAGNOSIS — M25551 Pain in right hip: Secondary | ICD-10-CM

## 2022-07-08 MED ORDER — BUPIVACAINE HCL 0.25 % IJ SOLN
4.0000 mL | INTRAMUSCULAR | Status: AC | PRN
  Administered 2022-07-08: 4 mL via INTRA_ARTICULAR

## 2022-07-08 MED ORDER — TRIAMCINOLONE ACETONIDE 40 MG/ML IJ SUSP
40.0000 mg | INTRAMUSCULAR | Status: AC | PRN
  Administered 2022-07-08: 40 mg via INTRA_ARTICULAR

## 2022-07-08 NOTE — Progress Notes (Signed)
The patient is a 77 year old gentleman well-known to [place].  We replaced his left hip in 2022.  He comes in today with right hip and groin pain but also radicular component of the pain.  He does ambulate using a cane.  He did strain his back and his hip while he has been lifting his wife who has dementia.  She is at home and hospice care but she is at home.  He is a patient of the [place] system.  He said they did put him on a steroid earlier this month which was a steroid taper and that did help some.  My exam the right hip moves smoothly and fluidly with no blocks to rotation but there is some pain in the groin but more so when I perform a straight leg raise.  However it does not seem to be in the sciatic area.  He does state it throbs at night.  An AP pelvis and lateral of the right hip shows a superior lateral osteophyte off the acetabular dome.  The joint space itself is well-maintained.  There is a well-seated left total hip arthroplasty.  X-rays of the lumbar spine show significant degenerative changes at multiple levels with degenerative disc disease as well.  The next prudent step would be setting him up for an intra-articular steroid injection in his right hip joint under radiographic guidance.  We will need to set this up and have him come back for that injection.  Once he has the injection we will have him get set up for a follow-up appointment with me about 2 weeks later so we can determine if this is helped and then what the neck step may be.

## 2022-07-08 NOTE — Progress Notes (Signed)
Functional Pain Scale - descriptive words and definitions  Moderate (4)   Constantly aware of pain, can complete ADLs with modification/sleep marginally affected at times/passive distraction is of no use, but active distraction gives some relief. Moderate range order  Average Pain 5   +Driver, -BT, -Dye Allergies.  Right hip pain that radiates into the groin and can feel pain in the leg

## 2022-07-08 NOTE — Progress Notes (Signed)
   Todd Gates - 77 y.o. male MRN [id number]  Date of birth: 30-Jun-1945  Office Visit Note: Visit Date: 07/08/2022 PCP: Billie Lade, MD Referred by: Kathryne Hitch*  Subjective: Chief Complaint  Patient presents with   Right Hip - Pain   HPI:  Santo Hitt is a 77 y.o. male who comes in today at the request of Dr. Doneen Poisson for planned Right anesthetic hip arthrogram with fluoroscopic guidance.  The patient has failed conservative care including home exercise, medications, time and activity modification.  This injection will be diagnostic and hopefully therapeutic.  Please see requesting physician notes for further details and justification.   ROS Otherwise per HPI.  Assessment & Plan: Visit Diagnoses:    ICD-10-CM   1. Pain in right hip  M25.551 Large Joint Inj: R hip joint    XR C-ARM NO REPORT      Plan: No additional findings.   Meds & Orders: No orders of the defined types were placed in this encounter.   Orders Placed This Encounter  Procedures   Large Joint Inj: R hip joint   XR C-ARM NO REPORT    Follow-up: No follow-ups on file.   Procedures: Large Joint Inj: R hip joint on 07/08/2022 1:49 PM Indications: diagnostic evaluation and pain Details: 22 G 3.5 in needle, fluoroscopy-guided anterior approach  Arthrogram: No  Medications: 4 mL bupivacaine 0.25 %; 40 mg triamcinolone acetonide 40 MG/ML Outcome: tolerated well, no immediate complications  There was excellent flow of contrast producing a partial arthrogram of the hip. The patient did have relief of symptoms during the anesthetic phase of the injection. Procedure, treatment alternatives, risks and benefits explained, specific risks discussed. Consent was given by the patient. Immediately prior to procedure a time out was called to verify the correct patient, procedure, equipment, support staff and site/side marked as required. Patient was prepped and draped in the usual sterile  fashion.          Clinical History: No specialty comments available.     Objective:  VS:  HT:    WT:   BMI:     BP:   HR: bpm  TEMP: ( )  RESP:  Physical Exam   Imaging: XR HIP UNILAT W OR W/O PELVIS 1V RIGHT  Result Date: 07/08/2022 An AP pelvis and lateral of the right hip shows a well-seated left total hip arthroplasty.  The right hip joint spaces well-maintained but there is a lateral and superior osteophyte off the edge of the acetabular dome.  XR Lumbar Spine 2-3 Views  Result Date: 07/08/2022 2 views of the lumbar spine show multilevel degenerative changes at the upper and lower lumbar spine.  There is significant degenerative disc disease.

## 2022-07-27 ENCOUNTER — Ambulatory Visit: Payer: Medicare HMO | Admitting: Physician Assistant

## 2022-07-28 ENCOUNTER — Ambulatory Visit (INDEPENDENT_AMBULATORY_CARE_PROVIDER_SITE_OTHER): Payer: No Typology Code available for payment source | Admitting: Physician Assistant

## 2022-07-28 ENCOUNTER — Encounter: Payer: Self-pay | Admitting: Physician Assistant

## 2022-07-28 DIAGNOSIS — M5416 Radiculopathy, lumbar region: Secondary | ICD-10-CM

## 2022-07-28 MED ORDER — METHOCARBAMOL 500 MG PO TABS: 500.0000 mg | ORAL_TABLET | Freq: Three times a day (TID) | ORAL | 0 refills | Status: DC | PRN

## 2022-07-28 NOTE — Progress Notes (Signed)
Office Visit Note   Patient: Todd Gates           Date of Birth: 14-May-1945           MRN: [id number] Visit Date: 07/28/2022              Requested by: Billie Lade, MD [address] [place],  [place] [address] PCP: Billie Lade, MD  Chief Complaint  Patient presents with   Right Hip - Pain      HPI: Patient is a pleasant 77 year old gentleman who is a follow-up from Dr. Magnus Ivan.  He presented to Dr. Magnus Ivan with right groin pain and leg pain.  Was at that time he had x-rays which demonstrated degenerative changes in his lumbar spine.  On exam it was felt that he had more issues going on with his hip.  He did undergo a steroid injection with Dr. Alvester Morin into his hip and had significant relief in his groin pain he still complains of sciatic pain has been going on for several weeks.  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  He cannot take anti-inflammatories because of a kidney condition and he cannot really tolerate steroids because they do cause his blood sugars to go up.  He denies any loss of bowel or bladder control  Assessment & Plan: Visit Diagnoses:  1. Radiculopathy, lumbar region     Plan: Findings consistent with lumbar radiculopathy.  He is got good strength and is neurovascularly intact.  He does have a positive straight leg raise on the right.  Because of limits of treatment I will refer him to physical therapy but I think he would also benefit from an MRI and possible ESI will follow-up in Dr. Magnus Ivan in 6 weeks sooner if he is having more problems  Follow-Up Instructions: Return in about 6 weeks (around 09/08/2022).   Ortho Exam  Patient is alert, oriented, no adenopathy, well-dressed, normal affect, normal respiratory effort. Examination of his low back he has some tenderness to palpation over the lower spine but no step-off.  He has no pain with manipulation of his hip.  He has 5 out of 5 strength  with resisted dorsiflexion and plantarflexion of his ankle and legs.  He has a positive straight leg raise on the right which reproduces the symptoms running down the lateral side of his leg.  Sensation is intact to light touch  Imaging: No results found. No images are attached to the encounter.  Labs: Lab Results  Component Value Date   HGBA1C 7.4 (H) 04/22/2022   HGBA1C 7.3 (H) 06/11/2021   HGBA1C 7.5 (H) 10/08/2020   REPTSTATUS 09/21/2007 FINAL 09/19/2007   CULT NO GROWTH 09/19/2007     Lab Results  Component Value Date   ALBUMIN 5.0 (H) 04/22/2022   ALBUMIN 4.7 03/03/2021    No results found for: "MG" Lab Results  Component Value Date   VD25OH 33.9 04/22/2022    No results found for: "PREALBUMIN"    Latest Ref Rng & Units 04/22/2022    3:03 PM 12/27/2021   10:30 AM 06/26/2021    3:51 AM  CBC EXTENDED  WBC 3.4 - 10.8 x10E3/uL 8.6  6.6    RBC 4.14 - 5.80 x10E6/uL 4.07  3.30    Hemoglobin 13.0 - 17.7 g/dL 21.3  08.6  9.5   HCT 57.8 - 51.0 % 36.3  32.2  28.9   Platelets  150 - 450 x10E3/uL 235  293    NEUT# 1.4 - 7.0 x10E3/uL 4.9     Lymph# 0.7 - 3.1 x10E3/uL 2.5        There is no height or weight on file to calculate BMI.  Orders:  Orders Placed This Encounter  Procedures   MR Lumbar Spine w/o contrast   Ambulatory referral to Physical Therapy   Meds ordered this encounter  Medications   methocarbamol (ROBAXIN) 500 MG tablet    Sig: Take 1 tablet (500 mg total) by mouth every 8 (eight) hours as needed for muscle spasms.    Dispense:  30 tablet    Refill:  0     Procedures: No procedures performed  Clinical Data: No additional findings.  ROS:  All other systems negative, except as noted in the HPI. Review of Systems  Objective: Vital Signs: There were no vitals taken for this visit.  Specialty Comments:  No specialty comments available.  PMFS History: Patient Active Problem List   Diagnosis Date Noted   Essential hypertension 04/22/2022    History of prostate cancer 04/22/2022   Gout 04/22/2022   Hyperlipidemia associated with type 2 diabetes mellitus (HCC) 04/22/2022   Type 2 diabetes mellitus with diabetic chronic kidney disease (HCC) 04/22/2022   CKD (chronic kidney disease) stage 3, GFR 30-59 ml/min (HCC) 04/22/2022   PUD (peptic ulcer disease) 04/22/2022   History of iron deficiency anemia 04/22/2022   OSA on CPAP 04/22/2022   Bilateral open angle glaucoma 04/22/2022   Encounter for general adult medical examination with abnormal findings 04/22/2022   Renal mass 06/24/2021   Iron deficiency anemia 03/31/2021   Right kidney mass 03/03/2021   Normocytic anemia 03/03/2021   Unilateral primary osteoarthritis, left hip 10/15/2020   Status post left hip replacement 10/15/2020   Achilles tendon contracture, left 02/21/2016   Plantar fasciitis 01/23/2016   Primary osteoarthritis of left knee 07/13/2013   Past Medical History:  Diagnosis Date   Arthritis    Cancer (HCC)    prostate; prostatectomy; radiation   Diabetes mellitus without complication (HCC)    TYPE 2   Fatty liver    Glaucoma    Both eyes   Gout    HTN (hypertension)    Peptic ulcer disease    Pneumonia    HX OF AS A CHILD   Sleep apnea    HAS CPAP    Family History  Problem Relation Age of Onset   Hypertension Unknown     Past Surgical History:  Procedure Laterality Date   COLONOSCOPY W/ POLYPECTOMY  02/2020   PROSTATE SURGERY     PROSTATECTOMY  2008   ROBOTIC ASSITED PARTIAL NEPHRECTOMY Right 06/24/2021   Procedure: XI ROBOTIC ASSITED PARTIAL NEPHRECTOMY;  Surgeon: Rene Paci, MD;  Location: [facility];  Service: Urology;  Laterality: Right;   TONSILLECTOMY     TOTAL HIP ARTHROPLASTY Left 10/15/2020   Procedure: LEFT TOTAL HIP ARTHROPLASTY ANTERIOR APPROACH;  Surgeon: Kathryne Hitch, MD;  Location: [facility];  Service: Orthopedics;  Laterality: Left;   Social History   Occupational History   Not on file  Tobacco Use    Smoking status: Former    Types: Cigarettes   Smokeless tobacco: Never  Vaping Use   Vaping Use: Not on file  Substance and Sexual Activity   Alcohol use: No   Drug use: No   Sexual activity: Not on file

## 2022-08-10 ENCOUNTER — Encounter (HOSPITAL_COMMUNITY): Payer: Self-pay | Admitting: Hematology

## 2022-08-18 ENCOUNTER — Telehealth: Payer: Self-pay | Admitting: Physician Assistant

## 2022-08-18 NOTE — Telephone Encounter (Signed)
[facility] to ATTN : Erie Noe Fax # [phone]

## 2022-08-20 ENCOUNTER — Other Ambulatory Visit: Payer: Self-pay | Admitting: Internal Medicine

## 2022-08-20 DIAGNOSIS — N1832 Chronic kidney disease, stage 3b: Secondary | ICD-10-CM

## 2022-08-24 ENCOUNTER — Other Ambulatory Visit: Payer: Self-pay

## 2022-08-24 ENCOUNTER — Encounter: Payer: Self-pay | Admitting: Internal Medicine

## 2022-08-24 ENCOUNTER — Ambulatory Visit (INDEPENDENT_AMBULATORY_CARE_PROVIDER_SITE_OTHER): Payer: Medicare HMO | Admitting: Internal Medicine

## 2022-08-24 VITALS — BP 132/57 | HR 54 | Ht 68.0 in | Wt 200.3 lb

## 2022-08-24 DIAGNOSIS — G4733 Obstructive sleep apnea (adult) (pediatric): Secondary | ICD-10-CM

## 2022-08-24 DIAGNOSIS — E785 Hyperlipidemia, unspecified: Secondary | ICD-10-CM

## 2022-08-24 DIAGNOSIS — I10 Essential (primary) hypertension: Secondary | ICD-10-CM | POA: Diagnosis not present

## 2022-08-24 DIAGNOSIS — N1832 Chronic kidney disease, stage 3b: Secondary | ICD-10-CM

## 2022-08-24 DIAGNOSIS — E1169 Type 2 diabetes mellitus with other specified complication: Secondary | ICD-10-CM | POA: Diagnosis not present

## 2022-08-24 DIAGNOSIS — E1122 Type 2 diabetes mellitus with diabetic chronic kidney disease: Secondary | ICD-10-CM

## 2022-08-24 LAB — POCT GLYCOSYLATED HEMOGLOBIN (HGB A1C): HbA1c, POC (controlled diabetic range): 7.2 % — AB (ref 0.0–7.0)

## 2022-08-24 MED ORDER — EMPAGLIFLOZIN 25 MG PO TABS: 25.0000 mg | ORAL_TABLET | Freq: Every day | ORAL | 2 refills | Status: DC

## 2022-08-24 MED ORDER — LABETALOL HCL 100 MG PO TABS: 100.0000 mg | ORAL_TABLET | Freq: Two times a day (BID) | ORAL | 2 refills | Status: DC

## 2022-08-24 NOTE — Progress Notes (Signed)
Established Patient Office Visit  Subjective   Patient ID: Todd Gates, male    DOB: 02-18-1946  Age: 77 y.o. MRN: [id number]  Chief Complaint  Patient presents with   Diabetes    Three month follow up    Todd Gates returns to care today for routine follow-up.  He was last evaluated by me on 3/27 for HTN follow-up.  Atorvastatin was increased to 40 mg daily at that time for improved treatment of hyperlipidemia in the setting of diabetes mellitus.  Jardiance was also increased to 25 mg daily for improved glycemic control.  In the interim he has been seen by orthopedic surgery for management of chronic back/right hip pain.  There have otherwise been no acute interval events.  Todd Gates reports feeling well today.  He continues to endorse chronic pain in his right hip and lower back.  He is otherwise asymptomatic and has no acute concerns to discuss today.  Past Medical History:  Diagnosis Date   Arthritis    Cancer [facility])    prostate; prostatectomy; radiation   Diabetes mellitus without complication (HCC)    TYPE 2   Fatty liver    Glaucoma    Both eyes   Gout    HTN (hypertension)    Peptic ulcer disease    Pneumonia    HX OF AS A CHILD   Sleep apnea    HAS CPAP   Past Surgical History:  Procedure Laterality Date   COLONOSCOPY W/ POLYPECTOMY  02/2020   PROSTATE SURGERY     PROSTATECTOMY  2008   ROBOTIC ASSITED PARTIAL NEPHRECTOMY Right 06/24/2021   Procedure: XI ROBOTIC ASSITED PARTIAL NEPHRECTOMY;  Surgeon: Rene Paci, MD;  Location: [facility];  Service: Urology;  Laterality: Right;   TONSILLECTOMY     TOTAL HIP ARTHROPLASTY Left 10/15/2020   Procedure: LEFT TOTAL HIP ARTHROPLASTY ANTERIOR APPROACH;  Surgeon: Kathryne Hitch, MD;  Location: [facility];  Service: Orthopedics;  Laterality: Left;   Social History   Tobacco Use   Smoking status: Former    Types: Cigarettes   Smokeless tobacco: Never  Substance Use Topics   Alcohol use: No   Drug use: No    Family History  Problem Relation Age of Onset   Hypertension Unknown    Allergies  Allergen Reactions   Feraheme [Ferumoxytol] Swelling    Facial swelling   Review of Systems  Musculoskeletal:  Positive for back pain (Chronic lumbar back pain) and joint pain (Chronic right hip pain).  All other systems reviewed and are negative.    Objective:     BP (!) 132/57   Pulse (!) 54   Ht 5\' 8"  (1.727 m)   Wt 200 lb 4.8 oz (90.9 kg)   SpO2 96%   BMI 30.46 kg/m  BP Readings from Last 3 Encounters:  08/24/22 (!) 132/57  05/20/22 120/68  04/22/22 (!) 160/73   Physical Exam Vitals reviewed.  Constitutional:      General: He is not in acute distress.    Appearance: Normal appearance. He is obese. He is not ill-appearing.  HENT:     Head: Normocephalic and atraumatic.     Right Ear: External ear normal.     Left Ear: External ear normal.     Nose: Nose normal. No congestion or rhinorrhea.     Mouth/Throat:     Mouth: Mucous membranes are moist.     Pharynx: Oropharynx is clear.  Eyes:     General: No  scleral icterus.    Extraocular Movements: Extraocular movements intact.     Conjunctiva/sclera: Conjunctivae normal.     Pupils: Pupils are equal, round, and reactive to light.  Cardiovascular:     Rate and Rhythm: Normal rate and regular rhythm.     Pulses: Normal pulses.     Heart sounds: Murmur heard.  Pulmonary:     Effort: Pulmonary effort is normal.     Breath sounds: Normal breath sounds. No wheezing, rhonchi or rales.  Abdominal:     General: Abdomen is flat. Bowel sounds are normal. There is no distension.     Palpations: Abdomen is soft.     Tenderness: There is no abdominal tenderness.     Hernia: A hernia (Reducible ventral hernia) is present.  Musculoskeletal:        General: No swelling or deformity. Normal range of motion.     Cervical back: Normal range of motion.  Skin:    General: Skin is warm and dry.     Capillary Refill: Capillary refill takes  less than 2 seconds.  Neurological:     General: No focal deficit present.     Mental Status: He is alert and oriented to person, place, and time.     Motor: No weakness.  Psychiatric:        Mood and Affect: Mood normal.        Behavior: Behavior normal.        Thought Content: Thought content normal.   Last CBC Lab Results  Component Value Date   WBC 8.6 04/22/2022   HGB 12.2 (L) 04/22/2022   HCT 36.3 (L) 04/22/2022   MCV 89 04/22/2022   MCH 30.0 04/22/2022   RDW 14.1 04/22/2022   PLT 235 04/22/2022   Last metabolic panel Lab Results  Component Value Date   GLUCOSE 105 (H) 04/22/2022   NA 143 04/22/2022   K 3.9 04/22/2022   CL 106 04/22/2022   CO2 21 04/22/2022   BUN 29 (H) 04/22/2022   CREATININE 2.02 (H) 04/22/2022   EGFR 33 (L) 04/22/2022   CALCIUM 10.3 (H) 04/22/2022   PROT 7.6 04/22/2022   ALBUMIN 5.0 (H) 04/22/2022   LABGLOB 2.6 04/22/2022   AGRATIO 1.9 04/22/2022   BILITOT 0.4 04/22/2022   ALKPHOS 107 04/22/2022   AST 22 04/22/2022   ALT 13 04/22/2022   ANIONGAP 9 12/27/2021   Last lipids Lab Results  Component Value Date   CHOL 174 04/22/2022   HDL 50 04/22/2022   LDLCALC 89 04/22/2022   TRIG 207 (H) 04/22/2022   CHOLHDL 3.5 04/22/2022   Last hemoglobin A1c Lab Results  Component Value Date   HGBA1C 7.2 (A) 08/24/2022   Last thyroid functions Lab Results  Component Value Date   TSH 1.370 04/22/2022   Last vitamin D Lab Results  Component Value Date   VD25OH 33.9 04/22/2022   Last vitamin B12 and Folate Lab Results  Component Value Date   VITAMINB12 328 04/22/2022   FOLATE >20.0 04/22/2022   The 10-year ASCVD risk score (Arnett DK, et al., 2019) is: 40.3%    Assessment & Plan:   Problem List Items Addressed This Visit       Essential hypertension - Primary    BP remains adequately controlled on current antihypertensive regimen, which consists of labetalol 100 mg twice daily, olmesartan 20 mg daily, and Lasix 40 mg daily.  No  additional medication changes are indicated today.      OSA on CPAP  He continues to endorse nightly compliance with CPAP      Hyperlipidemia associated with type 2 diabetes mellitus (HCC)    Atorvastatin was increased to 40 mg daily at his last appointment as a result of his significantly elevated 10-year ASCVD risk and elevated LDL in the setting of diabetes mellitus.  He has not experienced any adverse side effects since making these adjustments. -Repeat lipid ordered today      Type 2 diabetes mellitus with diabetic chronic kidney disease (HCC)    POC A1c today 7.2.  He is currently prescribed Jardiance 25 mg daily. -No additional medication changes today.  We reviewed the importance of lifestyle modifications aimed at lowering his blood sugar.  He endorses daily fruit juice consumption and was counseled on reducing the frequency of juice intake. -Diabetic foot exam completed last month -He is scheduled for ophthalmology follow-up next week.      Return in about 6 months (around 02/24/2023).   Billie Lade, MD

## 2022-08-24 NOTE — Assessment & Plan Note (Signed)
BP remains adequately controlled on current antihypertensive regimen, which consists of labetalol 100 mg twice daily, olmesartan 20 mg daily, and Lasix 40 mg daily.  No additional medication changes are indicated today.

## 2022-08-24 NOTE — Patient Instructions (Signed)
It was a pleasure to see you today.  Thank you for giving [place] the opportunity to be involved in your care.  Below is a brief recap of your visit and next steps.  We will plan to see you again in 6 months.  Summary No medication changes today Work on limiting fruit juices Follow up in 6 months

## 2022-08-24 NOTE — Assessment & Plan Note (Signed)
He continues to endorse nightly compliance with CPAP. 

## 2022-08-24 NOTE — Assessment & Plan Note (Signed)
POC A1c today 7.2.  He is currently prescribed Jardiance 25 mg daily. -No additional medication changes today.  We reviewed the importance of lifestyle modifications aimed at lowering his blood sugar.  He endorses daily fruit juice consumption and was counseled on reducing the frequency of juice intake. -Diabetic foot exam completed last month -He is scheduled for ophthalmology follow-up next week.

## 2022-08-24 NOTE — Assessment & Plan Note (Signed)
Atorvastatin was increased to 40 mg daily at his last appointment as a result of his significantly elevated 10-year ASCVD risk and elevated LDL in the setting of diabetes mellitus.  He has not experienced any adverse side effects since making these adjustments. -Repeat lipid ordered today

## 2022-08-25 LAB — LIPID PANEL
Chol/HDL Ratio: 3.5 ratio (ref 0.0–5.0)
Cholesterol, Total: 170 mg/dL (ref 100–199)
HDL: 48 mg/dL (ref >39)
LDL Chol Calc (NIH): 82 mg/dL (ref 0–99)
Triglycerides: 242 mg/dL — ABNORMAL HIGH (ref 0–149)
VLDL Cholesterol Cal: 40 mg/dL (ref 5–40)

## 2022-09-01 ENCOUNTER — Ambulatory Visit: Payer: Medicare HMO | Admitting: Physician Assistant

## 2022-09-03 ENCOUNTER — Ambulatory Visit: Payer: Medicare HMO | Admitting: Physician Assistant

## 2022-09-04 ENCOUNTER — Telehealth: Payer: Self-pay | Admitting: Internal Medicine

## 2022-09-04 NOTE — Telephone Encounter (Signed)
Patient called went to [facility] in [place] to have his eyes check and was told to let provider know when this was done and can get his records.

## 2022-09-04 NOTE — Telephone Encounter (Signed)
Requested records from [facility]

## 2022-09-09 ENCOUNTER — Other Ambulatory Visit: Payer: Self-pay | Admitting: Urology

## 2022-09-09 DIAGNOSIS — C641 Malignant neoplasm of right kidney, except renal pelvis: Secondary | ICD-10-CM

## 2022-09-14 ENCOUNTER — Ambulatory Visit (INDEPENDENT_AMBULATORY_CARE_PROVIDER_SITE_OTHER): Payer: No Typology Code available for payment source | Admitting: Physician Assistant

## 2022-09-14 ENCOUNTER — Encounter: Payer: Self-pay | Admitting: Physician Assistant

## 2022-09-14 ENCOUNTER — Other Ambulatory Visit (HOSPITAL_COMMUNITY): Payer: Self-pay | Admitting: Urology

## 2022-09-14 DIAGNOSIS — C641 Malignant neoplasm of right kidney, except renal pelvis: Secondary | ICD-10-CM

## 2022-09-14 DIAGNOSIS — C61 Malignant neoplasm of prostate: Secondary | ICD-10-CM

## 2022-09-14 DIAGNOSIS — M5440 Lumbago with sciatica, unspecified side: Secondary | ICD-10-CM | POA: Diagnosis not present

## 2022-09-14 MED ORDER — METHOCARBAMOL 500 MG PO TABS: 500.0000 mg | ORAL_TABLET | Freq: Three times a day (TID) | ORAL | 0 refills | Status: DC | PRN

## 2022-09-14 NOTE — Progress Notes (Signed)
Office Visit Note   Patient: Todd Gates           Date of Birth: 02-Sep-1945           MRN: [id number] Visit Date: 09/14/2022              Requested by: Billie Lade, MD [address] [place],  [place] [address] PCP: Billie Lade, MD  Chief Complaint  Patient presents with   Lower Back - Follow-up      HPI: Todd Gates is a pleasant 77 year old gentleman who comes in today for follow-up on his sciatic pain in his right lower back that runs down his leg.  This is been now going on well over 6 weeks and his plain x-ray shows degenerative changes.  He has been working with physical therapy and has not noticed any improvement.  We had recommended an MRI and possible ESI with Dr. Alvester Morin.  He has had hip injections with Dr. Alvester Morin and they seem to help his hip pain.  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  He cannot take NSAIDs because of kidney issues  Assessment & Plan: Visit Diagnoses: Low right sided back pain with sciatica  Plan: Pleasant 77 year old gentleman with greater than 6-week history of low back pain and sciatica with positive straight leg raise.  He has failed conservative treatment including rest cannot take anti-inflammatories because of a kidney issue and has had physical therapy without any improvement.  Will go forward with an MRI and referral to Dr. Alvester Morin for possible ESI.  Follow-Up Instructions: Dr. Franchot Heidelberg Exam  Patient is alert, oriented, no adenopathy, well-dressed, normal affect, normal respiratory effort. Low back he has pain with radicular findings going down his right posterior buttock to the back of his leg.  He is neurovascular intact he has good strength has a positive straight leg raise.  No pain in his hip joint.  Compartments are soft and nontender  Imaging: No results found. No images are attached to the encounter.  Labs: Lab Results  Component Value Date   HGBA1C 7.2  (A) 08/24/2022   HGBA1C 7.4 (H) 04/22/2022   HGBA1C 7.3 (H) 06/11/2021   REPTSTATUS 09/21/2007 FINAL 09/19/2007   CULT NO GROWTH 09/19/2007     Lab Results  Component Value Date   ALBUMIN 5.0 (H) 04/22/2022   ALBUMIN 4.7 03/03/2021    No results found for: "MG" Lab Results  Component Value Date   VD25OH 33.9 04/22/2022    No results found for: "PREALBUMIN"    Latest Ref Rng & Units 04/22/2022    3:03 PM 12/27/2021   10:30 AM 06/26/2021    3:51 AM  CBC EXTENDED  WBC 3.4 - 10.8 x10E3/uL 8.6  6.6    RBC 4.14 - 5.80 x10E6/uL 4.07  3.30    Hemoglobin 13.0 - 17.7 g/dL 29.5  62.1  9.5   HCT 30.8 - 51.0 % 36.3  32.2  28.9   Platelets 150 - 450 x10E3/uL 235  293    NEUT# 1.4 - 7.0 x10E3/uL 4.9     Lymph# 0.7 - 3.1 x10E3/uL 2.5        There is no height or weight on file to calculate BMI.  Orders:  No orders of the defined types were placed in this encounter.  No orders of the defined types were placed in this encounter.    Procedures: No procedures performed  Clinical Data: No additional findings.  ROS:  All other systems negative, except as noted in the HPI. Review of Systems  Objective: Vital Signs: There were no vitals taken for this visit.  Specialty Comments:  No specialty comments available.  PMFS History: Patient Active Problem List   Diagnosis Date Noted   Essential hypertension 04/22/2022   History of prostate cancer 04/22/2022   Gout 04/22/2022   Hyperlipidemia associated with type 2 diabetes mellitus (HCC) 04/22/2022   Type 2 diabetes mellitus with diabetic chronic kidney disease (HCC) 04/22/2022   CKD (chronic kidney disease) stage 3, GFR 30-59 ml/min (HCC) 04/22/2022   PUD (peptic ulcer disease) 04/22/2022   History of iron deficiency anemia 04/22/2022   OSA on CPAP 04/22/2022   Bilateral open angle glaucoma 04/22/2022   Encounter for general adult medical examination with abnormal findings 04/22/2022   Renal mass 06/24/2021   Iron  deficiency anemia 03/31/2021   Right kidney mass 03/03/2021   Normocytic anemia 03/03/2021   Unilateral primary osteoarthritis, left hip 10/15/2020   Status post left hip replacement 10/15/2020   Achilles tendon contracture, left 02/21/2016   Plantar fasciitis 01/23/2016   Primary osteoarthritis of left knee 07/13/2013   Past Medical History:  Diagnosis Date   Arthritis    Cancer (HCC)    prostate; prostatectomy; radiation   Diabetes mellitus without complication (HCC)    TYPE 2   Fatty liver    Glaucoma    Both eyes   Gout    HTN (hypertension)    Peptic ulcer disease    Pneumonia    HX OF AS A CHILD   Sleep apnea    HAS CPAP    Family History  Problem Relation Age of Onset   Hypertension Unknown     Past Surgical History:  Procedure Laterality Date   COLONOSCOPY W/ POLYPECTOMY  02/2020   PROSTATE SURGERY     PROSTATECTOMY  2008   ROBOTIC ASSITED PARTIAL NEPHRECTOMY Right 06/24/2021   Procedure: XI ROBOTIC ASSITED PARTIAL NEPHRECTOMY;  Surgeon: Rene Paci, MD;  Location: [facility];  Service: Urology;  Laterality: Right;   TONSILLECTOMY     TOTAL HIP ARTHROPLASTY Left 10/15/2020   Procedure: LEFT TOTAL HIP ARTHROPLASTY ANTERIOR APPROACH;  Surgeon: Kathryne Hitch, MD;  Location: [facility];  Service: Orthopedics;  Laterality: Left;   Social History   Occupational History   Not on file  Tobacco Use   Smoking status: Former    Types: Cigarettes   Smokeless tobacco: Never  Vaping Use   Vaping status: Not on file  Substance and Sexual Activity   Alcohol use: No   Drug use: No   Sexual activity: Not on file

## 2022-09-14 NOTE — Addendum Note (Signed)
Addended by: Polly Cobia on: 09/14/2022 02:25 PM   Modules accepted: Orders

## 2022-09-21 ENCOUNTER — Telehealth: Payer: Self-pay | Admitting: Physician Assistant

## 2022-09-21 NOTE — Telephone Encounter (Signed)
Patient called needing to schedule an MRI on his back. The number to contact patient is [phone]

## 2022-09-24 ENCOUNTER — Encounter (HOSPITAL_COMMUNITY): Payer: Self-pay | Admitting: Hematology

## 2022-10-02 ENCOUNTER — Other Ambulatory Visit: Payer: No Typology Code available for payment source

## 2022-10-05 ENCOUNTER — Other Ambulatory Visit: Payer: Self-pay | Admitting: Internal Medicine

## 2022-10-05 DIAGNOSIS — B351 Tinea unguium: Secondary | ICD-10-CM | POA: Diagnosis not present

## 2022-10-05 DIAGNOSIS — I10 Essential (primary) hypertension: Secondary | ICD-10-CM

## 2022-10-05 DIAGNOSIS — M2012 Hallux valgus (acquired), left foot: Secondary | ICD-10-CM | POA: Diagnosis not present

## 2022-10-05 DIAGNOSIS — L851 Acquired keratosis [keratoderma] palmaris et plantaris: Secondary | ICD-10-CM | POA: Diagnosis not present

## 2022-10-05 DIAGNOSIS — E119 Type 2 diabetes mellitus without complications: Secondary | ICD-10-CM | POA: Diagnosis not present

## 2022-10-07 ENCOUNTER — Ambulatory Visit
Admission: RE | Admit: 2022-10-07 | Discharge: 2022-10-07 | Disposition: A | Discharge status: Home / Self Care | Payer: No Typology Code available for payment source | Source: Ambulatory Visit | Attending: Physician Assistant | Admitting: Physician Assistant

## 2022-10-07 ENCOUNTER — Encounter (HOSPITAL_COMMUNITY): Payer: Self-pay | Admitting: Hematology

## 2022-10-07 ENCOUNTER — Other Ambulatory Visit: Payer: No Typology Code available for payment source

## 2022-10-07 DIAGNOSIS — M5416 Radiculopathy, lumbar region: Secondary | ICD-10-CM

## 2022-10-21 ENCOUNTER — Ambulatory Visit (INDEPENDENT_AMBULATORY_CARE_PROVIDER_SITE_OTHER): Payer: No Typology Code available for payment source | Admitting: Physician Assistant

## 2022-10-21 ENCOUNTER — Other Ambulatory Visit: Payer: Self-pay | Admitting: Physical Medicine and Rehabilitation

## 2022-10-21 ENCOUNTER — Encounter: Payer: Self-pay | Admitting: Physician Assistant

## 2022-10-21 DIAGNOSIS — M5416 Radiculopathy, lumbar region: Secondary | ICD-10-CM

## 2022-10-21 NOTE — Progress Notes (Signed)
Office Visit Note   Patient: Todd Gates           Date of Birth: 09-Jan-1946           MRN: [id number] Visit Date: 10/21/2022              Requested by: Billie Lade, MD [address] [place],  [place] [address] PCP: Billie Lade, MD  Chief Complaint  Patient presents with   Lower Back - Pain      HPI: Todd Gates is a pleasant 77 year old gentleman that I have been following for low back pain with radiation down into his right leg.  He is here to review his MRI.  He continues to have right-sided symptoms.  He is anxious to try and get an injection as he is taking care of his wife who is in hospice  Assessment & Plan: Visit Diagnoses: Lumbar radiculopathy canal stenosis  Plan: Did review his MRI with Dr. Virgina Jock team.  Would like to officially get him in for an interlaminar injection at L4-5.  Will put a referral in to [facility] imaging is that he could probably do this the soonest.  Will also consider having him follow-up with Dr. Christell Constant in the future  Follow-Up Instructions: No follow-ups on file.   Ortho Exam  Patient is alert, oriented, no adenopathy, well-dressed, normal affect, normal respiratory effort.  Patient is alert, oriented, no adenopathy, well-dressed, normal affect, normal respiratory effort. Low back he has pain with radicular findings going down his right posterior buttock to the back of his leg.  He is neurovascular intact he has good strength has a positive straight leg raise.  No pain in his hip joint.  Compartments are soft and nontende Imaging: No results found. No images are attached to the encounter.  Labs: Lab Results  Component Value Date   HGBA1C 7.2 (A) 08/24/2022   HGBA1C 7.4 (H) 04/22/2022   HGBA1C 7.3 (H) 06/11/2021   REPTSTATUS 09/21/2007 FINAL 09/19/2007   CULT NO GROWTH 09/19/2007     Lab Results  Component Value Date   ALBUMIN 5.0 (H) 04/22/2022   ALBUMIN 4.7 03/03/2021    No results found for: "MG" Lab Results   Component Value Date   VD25OH 33.9 04/22/2022    No results found for: "PREALBUMIN"    Latest Ref Rng & Units 04/22/2022    3:03 PM 12/27/2021   10:30 AM 06/26/2021    3:51 AM  CBC EXTENDED  WBC 3.4 - 10.8 x10E3/uL 8.6  6.6    RBC 4.14 - 5.80 x10E6/uL 4.07  3.30    Hemoglobin 13.0 - 17.7 g/dL 52.8  41.3  9.5   HCT 24.4 - 51.0 % 36.3  32.2  28.9   Platelets 150 - 450 x10E3/uL 235  293    NEUT# 1.4 - 7.0 x10E3/uL 4.9     Lymph# 0.7 - 3.1 x10E3/uL 2.5        There is no height or weight on file to calculate BMI.  Orders:  No orders of the defined types were placed in this encounter.  No orders of the defined types were placed in this encounter.    Procedures: No procedures performed  Clinical Data: No additional findings.  ROS:  All other systems negative, except as noted in the HPI. Review of Systems  Objective: Vital Signs: There were no vitals taken for this visit.  Specialty Comments:  No specialty comments available.  PMFS History: Patient Active Problem  List   Diagnosis Date Noted   Essential hypertension 04/22/2022   History of prostate cancer 04/22/2022   Gout 04/22/2022   Hyperlipidemia associated with type 2 diabetes mellitus (HCC) 04/22/2022   Type 2 diabetes mellitus with diabetic chronic kidney disease (HCC) 04/22/2022   CKD (chronic kidney disease) stage 3, GFR 30-59 ml/min (HCC) 04/22/2022   PUD (peptic ulcer disease) 04/22/2022   History of iron deficiency anemia 04/22/2022   OSA on CPAP 04/22/2022   Bilateral open angle glaucoma 04/22/2022   Encounter for general adult medical examination with abnormal findings 04/22/2022   Renal mass 06/24/2021   Iron deficiency anemia 03/31/2021   Right kidney mass 03/03/2021   Normocytic anemia 03/03/2021   Unilateral primary osteoarthritis, left hip 10/15/2020   Status post left hip replacement 10/15/2020   Achilles tendon contracture, left 02/21/2016   Plantar fasciitis 01/23/2016   Primary  osteoarthritis of left knee 07/13/2013   Past Medical History:  Diagnosis Date   Arthritis    Cancer (HCC)    prostate; prostatectomy; radiation   Diabetes mellitus without complication (HCC)    TYPE 2   Fatty liver    Glaucoma    Both eyes   Gout    HTN (hypertension)    Peptic ulcer disease    Pneumonia    HX OF AS A CHILD   Sleep apnea    HAS CPAP    Family History  Problem Relation Age of Onset   Hypertension Unknown     Past Surgical History:  Procedure Laterality Date   COLONOSCOPY W/ POLYPECTOMY  02/2020   PROSTATE SURGERY     PROSTATECTOMY  2008   ROBOTIC ASSITED PARTIAL NEPHRECTOMY Right 06/24/2021   Procedure: XI ROBOTIC ASSITED PARTIAL NEPHRECTOMY;  Surgeon: Rene Paci, MD;  Location: [facility];  Service: Urology;  Laterality: Right;   TONSILLECTOMY     TOTAL HIP ARTHROPLASTY Left 10/15/2020   Procedure: LEFT TOTAL HIP ARTHROPLASTY ANTERIOR APPROACH;  Surgeon: Kathryne Hitch, MD;  Location: [facility] OR;  Service: Orthopedics;  Laterality: Left;   Social History   Occupational History   Not on file  Tobacco Use   Smoking status: Former    Types: Cigarettes   Smokeless tobacco: Never  Vaping Use   Vaping status: Not on file  Substance and Sexual Activity   Alcohol use: No   Drug use: No   Sexual activity: Not on file

## 2022-10-23 ENCOUNTER — Ambulatory Visit
Admission: RE | Admit: 2022-10-23 | Discharge: 2022-10-23 | Disposition: A | Discharge status: Home / Self Care | Payer: No Typology Code available for payment source | Source: Ambulatory Visit | Attending: Physical Medicine and Rehabilitation | Admitting: Physical Medicine and Rehabilitation

## 2022-10-23 DIAGNOSIS — M5416 Radiculopathy, lumbar region: Secondary | ICD-10-CM

## 2022-10-23 MED ORDER — IOPAMIDOL (ISOVUE-M 200) INJECTION 41%
1.0000 mL | Freq: Once | INTRAMUSCULAR | Status: AC
  Administered 2022-10-23: 1 mL via EPIDURAL

## 2022-10-23 MED ORDER — METHYLPREDNISOLONE ACETATE 40 MG/ML INJ SUSP (RADIOLOG
80.0000 mg | Freq: Once | INTRAMUSCULAR | Status: AC
  Administered 2022-10-23: 80 mg via EPIDURAL

## 2022-10-23 NOTE — Discharge Instructions (Signed)

## 2022-10-27 DIAGNOSIS — U071 COVID-19: Secondary | ICD-10-CM | POA: Diagnosis not present

## 2022-10-27 DIAGNOSIS — Z20822 Contact with and (suspected) exposure to covid-19: Secondary | ICD-10-CM | POA: Diagnosis not present

## 2022-10-30 ENCOUNTER — Encounter (HOSPITAL_BASED_OUTPATIENT_CLINIC_OR_DEPARTMENT_OTHER): Payer: Self-pay | Admitting: Urology

## 2022-11-02 ENCOUNTER — Encounter (HOSPITAL_COMMUNITY): Payer: Self-pay | Admitting: Hematology

## 2022-11-03 ENCOUNTER — Ambulatory Visit (HOSPITAL_COMMUNITY)
Admission: RE | Admit: 2022-11-03 | Discharge: 2022-11-03 | Disposition: A | Discharge status: Home / Self Care | Payer: No Typology Code available for payment source | Source: Ambulatory Visit | Attending: Urology | Admitting: Urology

## 2022-11-03 ENCOUNTER — Other Ambulatory Visit: Payer: Medicare HMO

## 2022-11-03 DIAGNOSIS — C641 Malignant neoplasm of right kidney, except renal pelvis: Secondary | ICD-10-CM | POA: Diagnosis present

## 2022-11-03 DIAGNOSIS — C61 Malignant neoplasm of prostate: Secondary | ICD-10-CM | POA: Insufficient documentation

## 2022-11-03 MED ORDER — GADOBUTROL 1 MMOL/ML IV SOLN
9.0000 mL | Freq: Once | INTRAVENOUS | Status: AC | PRN
  Administered 2022-11-03: 9 mL via INTRAVENOUS

## 2022-11-05 ENCOUNTER — Encounter: Payer: Self-pay | Admitting: Family Medicine

## 2022-11-05 ENCOUNTER — Ambulatory Visit (INDEPENDENT_AMBULATORY_CARE_PROVIDER_SITE_OTHER): Payer: Medicare HMO | Admitting: Family Medicine

## 2022-11-05 VITALS — BP 150/73 | HR 54 | Ht 68.0 in | Wt 200.1 lb

## 2022-11-05 DIAGNOSIS — U071 COVID-19: Secondary | ICD-10-CM | POA: Diagnosis not present

## 2022-11-05 MED ORDER — FLUTICASONE PROPIONATE 50 MCG/ACT NA SUSP
2.0000 | Freq: Every day | NASAL | 2 refills | Status: DC
Start: 2022-11-05 — End: 2023-03-23

## 2022-11-05 NOTE — Progress Notes (Signed)
Virtual Visit via Video Note  I connected with Trenda Moots on 11/05/22 at  9:20 AM EDT by a video enabled telemedicine application and verified that I am speaking with the correct person using two identifiers.  Patient Location: Other:  In the Exam room Provider Location: Office/Clinic  I discussed the limitations, risks, security, and privacy concerns of performing an evaluation and management service by video and the availability of in person appointments. I also discussed with the patient that there may be a patient responsible charge related to this service. The patient expressed understanding and agreed to proceed.  Subjective: PCP: Billie Lade, MD  Chief Complaint  Patient presents with   Covid Positive    Was seen in [facility] 10/27/22 at [facility]. Wife recently passed and was surrounded by family, developed cough, and he is has been having sweats.    Sinus Problem    Sinus pressure   Immunizations    Pt wants Flu shot today. Pt. Has questions on if he can receive RSV, Tdap, Covid shot.    HPI The patient tested positive for COVID-19 on 10/27/2022 and was started on Paxlovid. He reports beginning Paxlovid on 10/28/2022 but has only been taking the medication once daily instead of the prescribed twice daily dosage. He currently has 11 pills remaining and reports ongoing symptoms of nasal congestion, night sweats, and a cough with clear mucus. He denies experiencing fever, sinus pressure or pain, headaches, nausea, vomiting, or loss of smell and taste.    ROS: Per HPI  Current Outpatient Medications:    atorvastatin (LIPITOR) 40 MG tablet, Take 1 tablet (40 mg total) by mouth daily., Disp: 90 tablet, Rfl: 3   Brinzolamide-Brimonidine (SIMBRINZA) 1-0.2 % SUSP, Place 1 drop into both eyes in the morning, at noon, and at bedtime., Disp: , Rfl:    Calcium Carbonate (CALCIUM 600 PO), Take 600 mg by mouth daily., Disp: , Rfl:    empagliflozin (JARDIANCE) 25 MG TABS tablet, Take 1 tablet (25  mg total) by mouth daily before breakfast., Disp: 30 tablet, Rfl: 2   fluticasone (FLONASE) 50 MCG/ACT nasal spray, Place 2 sprays into both nostrils daily., Disp: 16 g, Rfl: 2   labetalol (NORMODYNE) 100 MG tablet, Take 1 tablet (100 mg total) by mouth 2 (two) times daily., Disp: 60 tablet, Rfl: 2   MOLNUPIRAVIR PO, Take 4 capsules by mouth every 12 (twelve) hours. For 5 days, Disp: , Rfl:    olmesartan (BENICAR) 20 MG tablet, TAKE 1 TABLET BY MOUTH DAILY, Disp: 30 tablet, Rfl: 2   acetaminophen (TYLENOL) 500 MG tablet, Take 500 mg by mouth every 6 (six) hours as needed for mild pain. (Patient not taking: Reported on 11/05/2022), Disp: , Rfl:    febuxostat (ULORIC) 40 MG tablet, Take 40 mg by mouth daily as needed (gout flares). (Patient not taking: Reported on 11/05/2022), Disp: , Rfl:    methocarbamol (ROBAXIN) 500 MG tablet, Take 1 tablet (500 mg total) by mouth every 8 (eight) hours as needed for muscle spasms. (Patient not taking: Reported on 11/05/2022), Disp: 30 tablet, Rfl: 0  Observations/Objective: Today's Vitals   11/05/22 0859  BP: (!) 150/73  Pulse: (!) 54  SpO2: 95%  Weight: 200 lb 1.3 oz (90.8 kg)  Height: 5\' 8"  (1.727 m)  PainSc: 0-No pain   Physical Exam  Assessment and Plan: Lab test positive for detection of COVID-19 virus -     COVID-19, Flu A+B and RSV -     Fluticasone  Propionate; Place 2 sprays into both nostrils daily.  Dispense: 16 g; Refill: 2  COVID-19 Positive: -Medication: Please take Paxlovid twice daily as prescribed and complete the full course of treatment to help control your symptoms. -Nasal Congestion: I recommend using Flonase nasal spray to relieve nasal congestion. -Night Sweats: Night sweats can be a symptom of COVID-19. Stay hydrated and use a fan or air conditioning to help regulate your body temperature. -Hydration and Rest: Increase fluid intake and allow for plenty of rest. -Pain and Fever: Take Tylenol as needed for pain, fever, or general  discomfort. -Throat Discomfort: Warm saltwater gargles, 3-4 times daily, can help relieve throat pain or discomfort. -Cough and Congestion: I recommend using a humidifier at bedtime to help alleviate cough and nasal congestion.  Follow Up Instructions: Please follow up if your symptoms do not improve.  I discussed the assessment and treatment plan with the patient. The patient was provided an opportunity to ask questions, and all were answered. The patient agreed with the plan and demonstrated an understanding of the instructions.   The patient was advised to call back or seek an in-person evaluation if the symptoms worsen or if the condition fails to improve as anticipated.  The above assessment and management plan was discussed with the patient. The patient verbalized understanding of and has agreed to the management plan.   Gilmore Laroche, FNP

## 2022-11-05 NOTE — Patient Instructions (Addendum)
I appreciate the opportunity to provide care to you today!    Follow up:  pcp  COVID-19 Positive: -Medication: Please take Paxlovid twice daily as prescribed and complete the full course of treatment to help control your symptoms. -Nasal Congestion: I recommend using Flonase nasal spray to relieve nasal congestion. -Night Sweats: Night sweats can be a symptom of COVID-19. Stay hydrated and use a fan or air conditioning to help regulate your body temperature. -Hydration and Rest: Increase fluid intake and allow for plenty of rest. -Pain and Fever: Take Tylenol as needed for pain, fever, or general discomfort. -Throat Discomfort: Warm saltwater gargles, 3-4 times daily, can help relieve throat pain or discomfort. -Cough and Congestion: I recommend using a humidifier at bedtime to help alleviate cough and nasal congestion.  Please follow up if your symptoms do not improve.    Please continue to a heart-healthy diet and increase your physical activities. Try to exercise for at least five days a week.    It was a pleasure to see you and I look forward to continuing to work together on your health and well-being. Please do not hesitate to call the office if you need care or have questions about your care.  In case of emergency, please visit the Emergency Department for urgent care, or contact our clinic at [phone] to schedule an appointment. We're here to help you!   Have a wonderful day and week. With Gratitude, Gilmore Laroche MSN, FNP-BC

## 2022-11-07 LAB — COVID-19, FLU A+B AND RSV
Influenza A, NAA: NOT DETECTED
Influenza B, NAA: NOT DETECTED
RSV, NAA: NOT DETECTED
SARS-CoV-2, NAA: DETECTED — AB

## 2022-11-07 NOTE — Progress Notes (Signed)
Please inform the patient that he has tested positive for COVID-19. I recommend completing the Paxlovid treatment, staying well-hydrated, and getting plenty of rest.

## 2022-11-09 ENCOUNTER — Other Ambulatory Visit: Payer: Self-pay | Admitting: Internal Medicine

## 2022-11-09 DIAGNOSIS — I10 Essential (primary) hypertension: Secondary | ICD-10-CM

## 2022-11-12 DIAGNOSIS — J209 Acute bronchitis, unspecified: Secondary | ICD-10-CM | POA: Diagnosis not present

## 2022-11-12 DIAGNOSIS — Z20822 Contact with and (suspected) exposure to covid-19: Secondary | ICD-10-CM | POA: Diagnosis not present

## 2022-11-20 ENCOUNTER — Other Ambulatory Visit: Payer: Self-pay | Admitting: Urology

## 2022-11-20 ENCOUNTER — Other Ambulatory Visit: Payer: Self-pay | Admitting: Internal Medicine

## 2022-11-20 DIAGNOSIS — C641 Malignant neoplasm of right kidney, except renal pelvis: Secondary | ICD-10-CM

## 2022-11-20 DIAGNOSIS — E1122 Type 2 diabetes mellitus with diabetic chronic kidney disease: Secondary | ICD-10-CM

## 2022-12-14 DIAGNOSIS — L851 Acquired keratosis [keratoderma] palmaris et plantaris: Secondary | ICD-10-CM | POA: Diagnosis not present

## 2022-12-14 DIAGNOSIS — B351 Tinea unguium: Secondary | ICD-10-CM | POA: Diagnosis not present

## 2022-12-14 DIAGNOSIS — E1142 Type 2 diabetes mellitus with diabetic polyneuropathy: Secondary | ICD-10-CM | POA: Diagnosis not present

## 2022-12-14 DIAGNOSIS — M2012 Hallux valgus (acquired), left foot: Secondary | ICD-10-CM | POA: Diagnosis not present

## 2022-12-23 LAB — LAB REPORT - SCANNED
Albumin, Urine POC: 3
Creatinine, POC: 36.1 mg/dL
EGFR: 43
Microalb Creat Ratio: 8

## 2023-01-01 ENCOUNTER — Other Ambulatory Visit: Payer: Self-pay | Admitting: Internal Medicine

## 2023-01-01 DIAGNOSIS — I10 Essential (primary) hypertension: Secondary | ICD-10-CM

## 2023-01-08 ENCOUNTER — Encounter: Payer: Self-pay | Admitting: Internal Medicine

## 2023-01-08 ENCOUNTER — Ambulatory Visit (INDEPENDENT_AMBULATORY_CARE_PROVIDER_SITE_OTHER): Payer: Medicare HMO | Admitting: Internal Medicine

## 2023-01-08 VITALS — BP 149/61 | HR 58

## 2023-01-08 DIAGNOSIS — G6289 Other specified polyneuropathies: Secondary | ICD-10-CM

## 2023-01-08 DIAGNOSIS — I10 Essential (primary) hypertension: Secondary | ICD-10-CM | POA: Diagnosis not present

## 2023-01-08 DIAGNOSIS — G629 Polyneuropathy, unspecified: Secondary | ICD-10-CM | POA: Insufficient documentation

## 2023-01-08 MED ORDER — SIMBRINZA 1-0.2 % OP SUSP: 1.0000 [drp] | Freq: Three times a day (TID) | OPHTHALMIC | 0 refills | Status: DC

## 2023-01-08 MED ORDER — GABAPENTIN 100 MG PO CAPS: 100.0000 mg | ORAL_CAPSULE | Freq: Every day | ORAL | 3 refills | Status: DC

## 2023-01-08 MED ORDER — OLMESARTAN MEDOXOMIL 20 MG PO TABS
20.0000 mg | ORAL_TABLET | Freq: Every day | ORAL | 1 refills | Status: DC
Start: 2023-01-08 — End: 2023-06-28

## 2023-01-08 MED ORDER — LABETALOL HCL 100 MG PO TABS
100.0000 mg | ORAL_TABLET | Freq: Two times a day (BID) | ORAL | 1 refills | Status: DC
Start: 2023-01-08 — End: 2023-08-12

## 2023-01-08 NOTE — Progress Notes (Signed)
Acute Office Visit  Subjective:     Patient ID: Todd Gates, male    DOB: 11-25-1945, 77 y.o.   MRN: [id number]  Chief Complaint  Patient presents with   Tingling    Has been having pins and needles in his feet, especially at night for the past 2 months   Medication Refill    Wants to know if he needs to take a baby aspirin and if he should get the covid shot. States he had covid in Aug. Asking for a refill of his eyedrops because it takes the [place] too long to fill it and wants to keep it on hand   Mr. Matkin presents today for an acute visit with multiple concerns to discuss.  His chief concern is bilateral numbness/tingling and a burning sensation in his feet that mostly occurs at night.  Symptoms have been present for the last month.  He states that he briefly discussed this with his podiatrist, he felt his symptoms may be related to peripheral neuropathy.  Of note, he has a history of chronic lumbar back pain with radicular symptoms radiating to his right leg.  He received an epidural steroid injection in late August and reports that symptoms have significantly improved.  His additional concerns are mostly related to reviewing his currently prescribed medications.  Review of Systems  Neurological:  Positive for tingling (Numbness/tingling and burning sensation in both feet occurring at night).  All other systems reviewed and are negative.     Objective:    BP (!) 149/61   Pulse (!) 58   SpO2 95%   Physical Exam Vitals reviewed.  Constitutional:      General: He is not in acute distress.    Appearance: Normal appearance. He is obese. He is not ill-appearing.  HENT:     Head: Normocephalic and atraumatic.     Right Ear: External ear normal.     Left Ear: External ear normal.     Nose: Nose normal. No congestion or rhinorrhea.     Mouth/Throat:     Mouth: Mucous membranes are moist.     Pharynx: Oropharynx is clear.  Eyes:     General: No scleral icterus.    Extraocular  Movements: Extraocular movements intact.     Conjunctiva/sclera: Conjunctivae normal.     Pupils: Pupils are equal, round, and reactive to light.  Cardiovascular:     Rate and Rhythm: Normal rate and regular rhythm.     Pulses: Normal pulses.     Heart sounds: Murmur heard.  Pulmonary:     Effort: Pulmonary effort is normal.     Breath sounds: Normal breath sounds. No wheezing, rhonchi or rales.  Abdominal:     General: Abdomen is flat. Bowel sounds are normal. There is no distension.     Palpations: Abdomen is soft.     Tenderness: There is no abdominal tenderness.     Hernia: A hernia (Reducible ventral hernia) is present.  Musculoskeletal:        General: No swelling or deformity. Normal range of motion.     Cervical back: Normal range of motion.  Skin:    General: Skin is warm and dry.     Capillary Refill: Capillary refill takes less than 2 seconds.  Neurological:     General: No focal deficit present.     Mental Status: He is alert and oriented to person, place, and time.     Motor: No weakness.  Psychiatric:  Mood and Affect: Mood normal.        Behavior: Behavior normal.        Thought Content: Thought content normal.       Assessment & Plan:   Problem List Items Addressed This Visit       Peripheral neuropathy - Primary    Presenting today for an acute visit with chief concern of numbness/tingling and a burning sensation in his feet that occurs at night.  Symptoms have been present for the last month and are separate from symptoms he has been recently experienced with chronic lumbar back pain.  No obvious deformity or gross sensory deficits are appreciated on foot examination today.  Symptoms seem most consistent with diabetic polyneuropathy. -Start gabapentin 100 mg nightly.  Okay to increase dose in increments of 100 mg to a maximum dose of 300 mg for improved symptom control.  He will return to care for previously scheduled follow-up with me in January.        Meds ordered this encounter  Medications   Brinzolamide-Brimonidine (SIMBRINZA) 1-0.2 % SUSP    Sig: Place 1 drop into both eyes in the morning, at noon, and at bedtime.    Dispense:  8 mL    Refill:  0   labetalol (NORMODYNE) 100 MG tablet    Sig: Take 1 tablet (100 mg total) by mouth 2 (two) times daily.    Dispense:  180 tablet    Refill:  1    This prescription was filled on 10/20/2022. Any refills authorized will be placed on file.   olmesartan (BENICAR) 20 MG tablet    Sig: Take 1 tablet (20 mg total) by mouth daily.    Dispense:  90 tablet    Refill:  1    This prescription was filled on 12/12/2022. Any refills authorized will be placed on file.   gabapentin (NEURONTIN) 100 MG capsule    Sig: Take 1 capsule (100 mg total) by mouth at bedtime.    Dispense:  90 capsule    Refill:  3    Return if symptoms worsen or fail to improve.  Billie Lade, MD

## 2023-01-08 NOTE — Patient Instructions (Signed)
It was a pleasure to see you today.  Thank you for giving [place] the opportunity to be involved in your care.  Below is a brief recap of your visit and next steps.  We will plan to see you again in January.  Summary Start gabapentin 100 mg nightly for neuropathy. OK to increase in increments of 100 mg up to 300 mg nightly. Follow up as scheduled in January

## 2023-01-08 NOTE — Assessment & Plan Note (Signed)
Presenting today for an acute visit with chief concern of numbness/tingling and a burning sensation in his feet that occurs at night.  Symptoms have been present for the last month and are separate from symptoms he has been recently experienced with chronic lumbar back pain.  No obvious deformity or gross sensory deficits are appreciated on foot examination today.  Symptoms seem most consistent with diabetic polyneuropathy. -Start gabapentin 100 mg nightly.  Okay to increase dose in increments of 100 mg to a maximum dose of 300 mg for improved symptom control.  He will return to care for previously scheduled follow-up with me in January.

## 2023-01-18 ENCOUNTER — Other Ambulatory Visit (INDEPENDENT_AMBULATORY_CARE_PROVIDER_SITE_OTHER): Payer: Self-pay

## 2023-01-18 ENCOUNTER — Encounter: Payer: Self-pay | Admitting: Physician Assistant

## 2023-01-18 ENCOUNTER — Encounter (HOSPITAL_COMMUNITY): Payer: Self-pay | Admitting: Hematology

## 2023-01-18 ENCOUNTER — Ambulatory Visit: Payer: Medicare HMO | Admitting: Physician Assistant

## 2023-01-18 DIAGNOSIS — M25562 Pain in left knee: Secondary | ICD-10-CM

## 2023-01-18 DIAGNOSIS — M5416 Radiculopathy, lumbar region: Secondary | ICD-10-CM | POA: Diagnosis not present

## 2023-01-18 DIAGNOSIS — G8929 Other chronic pain: Secondary | ICD-10-CM | POA: Diagnosis not present

## 2023-01-18 MED ORDER — LIDOCAINE HCL 1 % IJ SOLN
3.0000 mL | INTRAMUSCULAR | Status: AC | PRN
Start: 2023-01-18 — End: 2023-01-18
  Administered 2023-01-18: 3 mL

## 2023-01-18 MED ORDER — METHYLPREDNISOLONE ACETATE 40 MG/ML IJ SUSP
40.0000 mg | INTRAMUSCULAR | Status: AC | PRN
Start: 2023-01-18 — End: 2023-01-18
  Administered 2023-01-18: 40 mg via INTRA_ARTICULAR

## 2023-01-18 NOTE — Progress Notes (Signed)
Office Visit Note   Patient: Todd Gates           Date of Birth: 03-Apr-1945           MRN: [id number] Visit Date: 01/18/2023              Requested by: Billie Lade, MD [address] [place],  [place] [address] PCP: Billie Lade, MD  Left knee pain    HPI: Patient is a pleasant 77 year old gentleman with a 2-day history of left knee pain.  He says it stiff in the morning.  He would like to get a steroid injection if possible.  Also discussing wants a steroid injection into his back.  Last previous injection was at the end of August  Assessment & Plan: Visit Diagnoses: Left knee pain                            Lumbar radiculopathy  Plan: Mr. Treadaway has a history of osteoarthritis especially of his left knee.  This has become somewhat symptomatic.  He is had no new injury.  He also has had injections into his back done at [facility] imaging last being at the end of August she is asking for new referral.  Will go forward with his knee injection today and place a referral for the left knee  Follow-Up Instructions: Return if symptoms worsen or fail to improve.   Ortho Exam  Patient is alert, oriented, no adenopathy, well-dressed, normal affect, normal respiratory effort. Left knee no effusion no erythema compartments are soft and compressible he is neurovascularly intact  Imaging: No results found. No images are attached to the encounter.  Labs: Lab Results  Component Value Date   HGBA1C 7.2 (A) 08/24/2022   HGBA1C 7.4 (H) 04/22/2022   HGBA1C 7.3 (H) 06/11/2021   REPTSTATUS 09/21/2007 FINAL 09/19/2007   CULT NO GROWTH 09/19/2007     Lab Results  Component Value Date   ALBUMIN 5.0 (H) 04/22/2022   ALBUMIN 4.7 03/03/2021    No results found for: "MG" Lab Results  Component Value Date   VD25OH 33.9 04/22/2022    No results found for: "PREALBUMIN"    Latest Ref Rng & Units 04/22/2022    3:03 PM 12/27/2021   10:30 AM 06/26/2021    3:51 AM  CBC EXTENDED   WBC 3.4 - 10.8 x10E3/uL 8.6  6.6    RBC 4.14 - 5.80 x10E6/uL 4.07  3.30    Hemoglobin 13.0 - 17.7 g/dL 21.3  08.6  9.5   HCT 57.8 - 51.0 % 36.3  32.2  28.9   Platelets 150 - 450 x10E3/uL 235  293    NEUT# 1.4 - 7.0 x10E3/uL 4.9     Lymph# 0.7 - 3.1 x10E3/uL 2.5        There is no height or weight on file to calculate BMI.  Orders:  No orders of the defined types were placed in this encounter.  No orders of the defined types were placed in this encounter.    Procedures: Large Joint Inj: L knee on 01/18/2023 9:24 AM Indications: pain and diagnostic evaluation Details: 25 G 1.5 in needle, anteromedial approach  Arthrogram: No  Medications: 40 mg methylPREDNISolone acetate 40 MG/ML; 3 mL lidocaine 1 % Outcome: tolerated well, no immediate complications Procedure, treatment alternatives, risks and benefits explained, specific risks discussed. Consent was given by the patient.    Clinical Data: No additional  findings.  ROS:  All other systems negative, except as noted in the HPI. Review of Systems  Objective: Vital Signs: There were no vitals taken for this visit.  Specialty Comments:  No specialty comments available.  PMFS History: Patient Active Problem List   Diagnosis Date Noted  . Peripheral neuropathy 01/08/2023  . Essential hypertension 04/22/2022  . History of prostate cancer 04/22/2022  . Gout 04/22/2022  . Hyperlipidemia associated with type 2 diabetes mellitus (HCC) 04/22/2022  . Type 2 diabetes mellitus with diabetic chronic kidney disease (HCC) 04/22/2022  . CKD (chronic kidney disease) stage 3, GFR 30-59 ml/min (HCC) 04/22/2022  . PUD (peptic ulcer disease) 04/22/2022  . History of iron deficiency anemia 04/22/2022  . OSA on CPAP 04/22/2022  . Bilateral open angle glaucoma 04/22/2022  . Encounter for general adult medical examination with abnormal findings 04/22/2022  . Renal mass 06/24/2021  . Iron deficiency anemia 03/31/2021  . Right kidney  mass 03/03/2021  . Normocytic anemia 03/03/2021  . Unilateral primary osteoarthritis, left hip 10/15/2020  . Status post left hip replacement 10/15/2020  . Achilles tendon contracture, left 02/21/2016  . Plantar fasciitis 01/23/2016  . Primary osteoarthritis of left knee 07/13/2013   Past Medical History:  Diagnosis Date  . Arthritis   . Cancer [facility])    prostate; prostatectomy; radiation  . Diabetes mellitus without complication (HCC)    TYPE 2  . Fatty liver   . Glaucoma    Both eyes  . Gout   . HTN (hypertension)   . Peptic ulcer disease   . Pneumonia    HX OF AS A CHILD  . Sleep apnea    HAS CPAP    Family History  Problem Relation Age of Onset  . Hypertension Unknown     Past Surgical History:  Procedure Laterality Date  . COLONOSCOPY W/ POLYPECTOMY  02/2020  . PROSTATE SURGERY    . PROSTATECTOMY  2008  . ROBOTIC ASSITED PARTIAL NEPHRECTOMY Right 06/24/2021   Procedure: XI ROBOTIC ASSITED PARTIAL NEPHRECTOMY;  Surgeon: Rene Paci, MD;  Location: [facility];  Service: Urology;  Laterality: Right;  . TONSILLECTOMY    . TOTAL HIP ARTHROPLASTY Left 10/15/2020   Procedure: LEFT TOTAL HIP ARTHROPLASTY ANTERIOR APPROACH;  Surgeon: Kathryne Hitch, MD;  Location: [facility] OR;  Service: Orthopedics;  Laterality: Left;   Social History   Occupational History  . Not on file  Tobacco Use  . Smoking status: Former    Types: Cigarettes  . Smokeless tobacco: Never  Vaping Use  . Vaping status: Not on file  Substance and Sexual Activity  . Alcohol use: No  . Drug use: No  . Sexual activity: Not on file

## 2023-01-25 DIAGNOSIS — J0100 Acute maxillary sinusitis, unspecified: Secondary | ICD-10-CM | POA: Diagnosis not present

## 2023-01-27 NOTE — Discharge Instructions (Signed)

## 2023-01-28 ENCOUNTER — Ambulatory Visit
Admission: RE | Admit: 2023-01-28 | Discharge: 2023-01-28 | Disposition: A | Discharge status: Home / Self Care | Payer: No Typology Code available for payment source | Source: Ambulatory Visit | Attending: Physician Assistant | Admitting: Physician Assistant

## 2023-01-28 DIAGNOSIS — M5416 Radiculopathy, lumbar region: Secondary | ICD-10-CM

## 2023-01-28 DIAGNOSIS — M4727 Other spondylosis with radiculopathy, lumbosacral region: Secondary | ICD-10-CM | POA: Diagnosis not present

## 2023-01-28 MED ORDER — METHYLPREDNISOLONE ACETATE 40 MG/ML INJ SUSP (RADIOLOG
80.0000 mg | Freq: Once | INTRAMUSCULAR | Status: AC
  Administered 2023-01-28: 80 mg via EPIDURAL

## 2023-01-28 MED ORDER — IOPAMIDOL (ISOVUE-M 200) INJECTION 41%
1.0000 mL | Freq: Once | INTRAMUSCULAR | Status: AC
  Administered 2023-01-28: 1 mL via EPIDURAL

## 2023-02-11 DIAGNOSIS — Z008 Encounter for other general examination: Secondary | ICD-10-CM | POA: Diagnosis not present

## 2023-02-22 DIAGNOSIS — E1142 Type 2 diabetes mellitus with diabetic polyneuropathy: Secondary | ICD-10-CM | POA: Diagnosis not present

## 2023-02-22 DIAGNOSIS — L851 Acquired keratosis [keratoderma] palmaris et plantaris: Secondary | ICD-10-CM | POA: Diagnosis not present

## 2023-02-22 DIAGNOSIS — B351 Tinea unguium: Secondary | ICD-10-CM | POA: Diagnosis not present

## 2023-03-01 ENCOUNTER — Encounter: Payer: Self-pay | Admitting: Internal Medicine

## 2023-03-01 ENCOUNTER — Ambulatory Visit: Payer: Medicare HMO | Admitting: Internal Medicine

## 2023-03-01 VITALS — BP 135/62 | HR 60 | Ht 68.0 in | Wt 209.2 lb

## 2023-03-01 DIAGNOSIS — E1169 Type 2 diabetes mellitus with other specified complication: Secondary | ICD-10-CM | POA: Diagnosis not present

## 2023-03-01 DIAGNOSIS — E1122 Type 2 diabetes mellitus with diabetic chronic kidney disease: Secondary | ICD-10-CM

## 2023-03-01 DIAGNOSIS — G6289 Other specified polyneuropathies: Secondary | ICD-10-CM

## 2023-03-01 DIAGNOSIS — E785 Hyperlipidemia, unspecified: Secondary | ICD-10-CM

## 2023-03-01 DIAGNOSIS — N1831 Chronic kidney disease, stage 3a: Secondary | ICD-10-CM | POA: Diagnosis not present

## 2023-03-01 DIAGNOSIS — I10 Essential (primary) hypertension: Secondary | ICD-10-CM

## 2023-03-01 DIAGNOSIS — Z7984 Long term (current) use of oral hypoglycemic drugs: Secondary | ICD-10-CM

## 2023-03-01 DIAGNOSIS — N1832 Chronic kidney disease, stage 3b: Secondary | ICD-10-CM

## 2023-03-01 DIAGNOSIS — R0789 Other chest pain: Secondary | ICD-10-CM | POA: Insufficient documentation

## 2023-03-01 DIAGNOSIS — G4733 Obstructive sleep apnea (adult) (pediatric): Secondary | ICD-10-CM

## 2023-03-01 NOTE — Progress Notes (Signed)
 Established Patient Office Visit  Subjective   Patient ID: Todd Gates, male    DOB: 05/17/1945  Age: 78 y.o. MRN: [id number]  Chief Complaint  Patient presents with   Hypertension    Follow up    Chest Pain    Tightness in the top part of his chest    Todd Gates returns to care today for follow-up.  He was last evaluated by me on 11/15 for an acute visit in the setting of numbness/tingling and a burning sensation in his feet that occurred at night.  Gabapentin  100 mg nightly was started for treatment of peripheral neuropathy.  In the interim, he was seen by orthopedic surgery for evaluation of left knee pain.  There have otherwise been no acute interval events.  Previously seen by me for routine follow-up on 7/1.  Todd Gates reports feeling fairly well today.  His acute concern is intermittent chest tightness that has occurred for the last month.  He is unaware of any exacerbating or alleviating factors.  Denies additional symptoms.  No prior cardiac history.  Not currently followed by cardiology.  Past Medical History:  Diagnosis Date   Arthritis    Cancer [facility])    prostate; prostatectomy; radiation   Diabetes mellitus without complication (HCC)    TYPE 2   Fatty liver    Glaucoma    Both eyes   Gout    HTN (hypertension)    Peptic ulcer disease    Pneumonia    HX OF AS A CHILD   Sleep apnea    HAS CPAP   Past Surgical History:  Procedure Laterality Date   COLONOSCOPY W/ POLYPECTOMY  02/2020   PROSTATE SURGERY     PROSTATECTOMY  2008   ROBOTIC ASSITED PARTIAL NEPHRECTOMY Right 06/24/2021   Procedure: XI ROBOTIC ASSITED PARTIAL NEPHRECTOMY;  Surgeon: Todd Lonni Righter, MD;  Location: [facility];  Service: Urology;  Laterality: Right;   TONSILLECTOMY     TOTAL HIP ARTHROPLASTY Left 10/15/2020   Procedure: LEFT TOTAL HIP ARTHROPLASTY ANTERIOR APPROACH;  Surgeon: Todd Lonni GRADE, MD;  Location: [facility];  Service: Orthopedics;  Laterality: Left;   Social History    Tobacco Use   Smoking status: Former    Types: Cigarettes   Smokeless tobacco: Never  Substance Use Topics   Alcohol  use: No   Drug use: No   Family History  Problem Relation Age of Onset   Hypertension Unknown    Allergies  Allergen Reactions   Feraheme  [Ferumoxytol ] Swelling    Facial swelling   Review of Systems  Constitutional:  Negative for chills and fever.  HENT:  Negative for sore throat.   Respiratory:  Negative for cough and shortness of breath.   Cardiovascular:  Positive for chest pain (Chest tightness, intermittent x 1 month). Negative for palpitations and leg swelling.  Gastrointestinal:  Negative for abdominal pain, blood in stool, constipation, diarrhea, nausea and vomiting.  Genitourinary:  Negative for dysuria and hematuria.  Musculoskeletal:  Negative for myalgias.  Skin:  Negative for itching and rash.  Neurological:  Negative for dizziness and headaches.  Psychiatric/Behavioral:  Negative for depression and suicidal ideas.      Objective:     BP 135/62 (BP Location: Left Arm, Patient Position: Sitting, Cuff Size: Normal)   Pulse 60   Ht 5' 8 (1.727 m)   Wt 209 lb 3.2 oz (94.9 kg)   SpO2 95%   BMI 31.81 kg/m  BP Readings from Last 3 Encounters:  03/01/23 135/62  01/28/23 (!) 148/63  01/08/23 (!) 149/61   Physical Exam Vitals reviewed.  Constitutional:      General: He is not in acute distress.    Appearance: Normal appearance. He is obese. He is not ill-appearing.  HENT:     Head: Normocephalic and atraumatic.     Right Ear: External ear normal.     Left Ear: External ear normal.     Nose: Nose normal. No congestion or rhinorrhea.     Mouth/Throat:     Mouth: Mucous membranes are moist.     Pharynx: Oropharynx is clear.  Eyes:     General: No scleral icterus.    Extraocular Movements: Extraocular movements intact.     Conjunctiva/sclera: Conjunctivae normal.     Pupils: Pupils are equal, round, and reactive to light.   Cardiovascular:     Rate and Rhythm: Normal rate and regular rhythm.     Pulses: Normal pulses.     Heart sounds: Normal heart sounds. No murmur heard. Pulmonary:     Effort: Pulmonary effort is normal.     Breath sounds: Normal breath sounds. No wheezing, rhonchi or rales.  Abdominal:     General: Abdomen is flat. Bowel sounds are normal. There is no distension.     Palpations: Abdomen is soft.     Tenderness: There is no abdominal tenderness.  Musculoskeletal:        General: No swelling or deformity. Normal range of motion.     Cervical back: Normal range of motion.  Skin:    General: Skin is warm and dry.     Capillary Refill: Capillary refill takes less than 2 seconds.  Neurological:     General: No focal deficit present.     Mental Status: He is alert and oriented to person, place, and time.     Motor: No weakness.  Psychiatric:        Mood and Affect: Mood normal.        Behavior: Behavior normal.        Thought Content: Thought content normal.   Last CBC Lab Results  Component Value Date   WBC 8.6 04/22/2022   HGB 12.2 (L) 04/22/2022   HCT 36.3 (L) 04/22/2022   MCV 89 04/22/2022   MCH 30.0 04/22/2022   RDW 14.1 04/22/2022   PLT 235 04/22/2022   Last metabolic panel Lab Results  Component Value Date   GLUCOSE 105 (H) 04/22/2022   NA 143 04/22/2022   K 3.9 04/22/2022   CL 106 04/22/2022   CO2 21 04/22/2022   BUN 29 (H) 04/22/2022   CREATININE 2.02 (H) 04/22/2022   EGFR 43.0 12/22/2022   CALCIUM  10.3 (H) 04/22/2022   PROT 7.6 04/22/2022   ALBUMIN 5.0 (H) 04/22/2022   LABGLOB 2.6 04/22/2022   AGRATIO 1.9 04/22/2022   BILITOT 0.4 04/22/2022   ALKPHOS 107 04/22/2022   AST 22 04/22/2022   ALT 13 04/22/2022   ANIONGAP 9 12/27/2021   Last lipids Lab Results  Component Value Date   CHOL 170 08/24/2022   HDL 48 08/24/2022   LDLCALC 82 08/24/2022   TRIG 242 (H) 08/24/2022   CHOLHDL 3.5 08/24/2022   Last hemoglobin A1c Lab Results  Component Value  Date   HGBA1C 7.2 (A) 08/24/2022   Last thyroid  functions Lab Results  Component Value Date   TSH 1.370 04/22/2022   Last vitamin D  Lab Results  Component Value Date   VD25OH 33.9 04/22/2022   Last  vitamin B12 and Folate Lab Results  Component Value Date   VITAMINB12 328 04/22/2022   FOLATE >20.0 04/22/2022   The 10-year ASCVD risk score (Arnett DK, et al., 2019) is: 41.9%    Assessment & Plan:   Problem List Items Addressed This Visit       Essential hypertension - Primary   Adequately controlled on current antihypertensive regimen.  No medication changes are indicated today.      OSA on CPAP   He continues to endorse nightly compliance with CPAP      Hyperlipidemia associated with type 2 diabetes mellitus (HCC)   Currently prescribed atorvastatin  40 mg daily.  Lipid panel last updated in July.  Total cholesterol 170 and LDL 82.  Repeat lipid panel ordered today.      Type 2 diabetes mellitus with diabetic chronic kidney disease (HCC)   A1c 7.2 on labs from July.  He is currently prescribed Jardiance  25 mg daily. -Repeat A1c ordered today      Peripheral neuropathy   He endorses significant symptomatic improvement since starting gabapentin  100 mg nightly.  No further changes are indicated today.      CKD (chronic kidney disease) stage 3, GFR 30-59 ml/min (HCC)   Followed by nephrology (Dr. Rayburn).  Currently on ARB and SGLT2i.  Repeat labs ordered today.      Chest tightness   His acute concern today is intermittent chest tightness that is occurred for the last month.  He has not identified any exacerbating or alleviating factors.  He exercises regularly and states that symptoms are not worse with exertion.  He further denies associated symptoms such as shortness of breath, dizziness/lightheadedness, and palpitations.  Pain is located in the upper part of his chest and does not radiate.  Cardiac exam today is normal.  Pain is not reproduced with palpation of  the chest wall. -Given patient's multiple cardiac risk factors and symptoms endorsed today, cardiology referral placed for further evaluation.      Return in about 4 months (around 06/29/2023).   Manus FORBES Fireman, MD

## 2023-03-01 NOTE — Assessment & Plan Note (Signed)
 A1c 7.2 on labs from July.  He is currently prescribed Jardiance 25 mg daily. -Repeat A1c ordered today

## 2023-03-01 NOTE — Assessment & Plan Note (Signed)
He continues to endorse nightly compliance with CPAP. 

## 2023-03-01 NOTE — Assessment & Plan Note (Addendum)
 Followed by nephrology (Dr. Arrie Aran).  Currently on ARB and SGLT2i.  Repeat labs ordered today.

## 2023-03-01 NOTE — Assessment & Plan Note (Signed)
 His acute concern today is intermittent chest tightness that is occurred for the last month.  He has not identified any exacerbating or alleviating factors.  He exercises regularly and states that symptoms are not worse with exertion.  He further denies associated symptoms such as shortness of breath, dizziness/lightheadedness, and palpitations.  Pain is located in the upper part of his chest and does not radiate.  Cardiac exam today is normal.  Pain is not reproduced with palpation of the chest wall. -Given patient's multiple cardiac risk factors and symptoms endorsed today, cardiology referral placed for further evaluation.

## 2023-03-01 NOTE — Assessment & Plan Note (Signed)
 Adequately controlled on current antihypertensive regimen.  No medication changes are indicated today.

## 2023-03-01 NOTE — Patient Instructions (Signed)
 It was a pleasure to see you today.  Thank you for giving us  the opportunity to be involved in your care.  Below is a brief recap of your visit and next steps.  We will plan to see you again in 4 months.  Summary No medication changes today Cardiology referral placed Follow up in 4 months

## 2023-03-01 NOTE — Assessment & Plan Note (Signed)
 Currently prescribed atorvastatin 40 mg daily.  Lipid panel last updated in July.  Total cholesterol 170 and LDL 82.  Repeat lipid panel ordered today.

## 2023-03-01 NOTE — Assessment & Plan Note (Signed)
 He endorses significant symptomatic improvement since starting gabapentin 100 mg nightly.  No further changes are indicated today.

## 2023-03-02 ENCOUNTER — Other Ambulatory Visit: Payer: Self-pay | Admitting: Internal Medicine

## 2023-03-02 DIAGNOSIS — E785 Hyperlipidemia, unspecified: Secondary | ICD-10-CM

## 2023-03-02 DIAGNOSIS — E1169 Type 2 diabetes mellitus with other specified complication: Secondary | ICD-10-CM

## 2023-03-02 LAB — CMP14+EGFR
ALT: 13 [IU]/L (ref 0–44)
AST: 22 [IU]/L (ref 0–40)
Albumin: 4.5 g/dL (ref 3.8–4.8)
Alkaline Phosphatase: 104 [IU]/L (ref 44–121)
BUN/Creatinine Ratio: 14 (ref 10–24)
BUN: 24 mg/dL (ref 8–27)
Bilirubin Total: 0.3 mg/dL (ref 0.0–1.2)
CO2: 19 mmol/L — ABNORMAL LOW (ref 20–29)
Calcium: 9.8 mg/dL (ref 8.6–10.2)
Chloride: 112 mmol/L — ABNORMAL HIGH (ref 96–106)
Creatinine, Ser: 1.77 mg/dL — ABNORMAL HIGH (ref 0.76–1.27)
Globulin, Total: 2.4 g/dL (ref 1.5–4.5)
Glucose: 147 mg/dL — ABNORMAL HIGH (ref 70–99)
Potassium: 4 mmol/L (ref 3.5–5.2)
Sodium: 145 mmol/L — ABNORMAL HIGH (ref 134–144)
Total Protein: 6.9 g/dL (ref 6.0–8.5)
eGFR: 39 mL/min/{1.73_m2} — ABNORMAL LOW (ref >59)

## 2023-03-02 LAB — B12 AND FOLATE PANEL
Folate: >20 ng/mL (ref >3.0)
Vitamin B-12: 402 pg/mL (ref 232–1245)

## 2023-03-02 LAB — HEMOGLOBIN A1C
Est. average glucose Bld gHb Est-mCnc: 186 mg/dL
Hgb A1c MFr Bld: 8.1 % — ABNORMAL HIGH (ref 4.8–5.6)

## 2023-03-02 LAB — CBC WITH DIFFERENTIAL/PLATELET
Basophils Absolute: 0.1 10*3/uL (ref 0.0–0.2)
Basos: 1 %
EOS (ABSOLUTE): 0.2 10*3/uL (ref 0.0–0.4)
Eos: 3 %
Hematocrit: 33.6 % — ABNORMAL LOW (ref 37.5–51.0)
Hemoglobin: 11.3 g/dL — ABNORMAL LOW (ref 13.0–17.7)
Immature Grans (Abs): 0 10*3/uL (ref 0.0–0.1)
Immature Granulocytes: 0 %
Lymphocytes Absolute: 2 10*3/uL (ref 0.7–3.1)
Lymphs: 30 %
MCH: 31.7 pg (ref 26.6–33.0)
MCHC: 33.6 g/dL (ref 31.5–35.7)
MCV: 94 fL (ref 79–97)
Monocytes Absolute: 0.6 10*3/uL (ref 0.1–0.9)
Monocytes: 9 %
Neutrophils Absolute: 3.7 10*3/uL (ref 1.4–7.0)
Neutrophils: 57 %
Platelets: 197 10*3/uL (ref 150–450)
RBC: 3.57 x10E6/uL — ABNORMAL LOW (ref 4.14–5.80)
RDW: 13.2 % (ref 11.6–15.4)
WBC: 6.7 10*3/uL (ref 3.4–10.8)

## 2023-03-02 LAB — LIPID PANEL
Chol/HDL Ratio: 4.2 {ratio} (ref 0.0–5.0)
Cholesterol, Total: 207 mg/dL — ABNORMAL HIGH (ref 100–199)
HDL: 49 mg/dL (ref >39)
LDL Chol Calc (NIH): 112 mg/dL — ABNORMAL HIGH (ref 0–99)
Triglycerides: 269 mg/dL — ABNORMAL HIGH (ref 0–149)
VLDL Cholesterol Cal: 46 mg/dL — ABNORMAL HIGH (ref 5–40)

## 2023-03-02 LAB — VITAMIN D 25 HYDROXY (VIT D DEFICIENCY, FRACTURES): Vit D, 25-Hydroxy: 26.4 ng/mL — ABNORMAL LOW (ref 30.0–100.0)

## 2023-03-02 LAB — TSH+FREE T4
Free T4: 1.13 ng/dL (ref 0.82–1.77)
TSH: 1.16 u[IU]/mL (ref 0.450–4.500)

## 2023-03-02 MED ORDER — ATORVASTATIN CALCIUM 80 MG PO TABS: 80.0000 mg | ORAL_TABLET | Freq: Every day | ORAL | 3 refills | Status: DC

## 2023-03-23 ENCOUNTER — Ambulatory Visit: Payer: Medicare HMO | Attending: Internal Medicine | Admitting: Internal Medicine

## 2023-03-23 ENCOUNTER — Encounter: Payer: Self-pay | Admitting: Internal Medicine

## 2023-03-23 ENCOUNTER — Encounter: Payer: Self-pay | Admitting: *Deleted

## 2023-03-23 ENCOUNTER — Telehealth: Payer: Self-pay | Admitting: Internal Medicine

## 2023-03-23 VITALS — BP 134/72 | HR 54 | Ht 68.5 in | Wt 209.2 lb

## 2023-03-23 DIAGNOSIS — R001 Bradycardia, unspecified: Secondary | ICD-10-CM | POA: Diagnosis not present

## 2023-03-23 DIAGNOSIS — R0789 Other chest pain: Secondary | ICD-10-CM

## 2023-03-23 DIAGNOSIS — E781 Pure hyperglyceridemia: Secondary | ICD-10-CM | POA: Diagnosis not present

## 2023-03-23 DIAGNOSIS — I10 Essential (primary) hypertension: Secondary | ICD-10-CM | POA: Diagnosis not present

## 2023-03-23 MED ORDER — EZETIMIBE 10 MG PO TABS: 10.0000 mg | ORAL_TABLET | Freq: Every day | ORAL | 3 refills | Status: DC

## 2023-03-23 NOTE — Patient Instructions (Addendum)
Medication Instructions:  Your physician has recommended you make the following change in your medication:  Start zetia 10 mg daily Continue all other medications as prescribed  Labwork: none  Testing/Procedures: Your physician has requested that you have a lexiscan myoview. For further information please visit [email or web address] Please follow instruction sheet, as given.  Follow-Up: Your physician recommends that you schedule a follow-up appointment in: 6 months  Any Other Special Instructions Will Be Listed Below (If Applicable).  If you need a refill on your cardiac medications before your next appointment, please call your pharmacy.

## 2023-03-23 NOTE — Telephone Encounter (Signed)
Checking percert on the following patient for testing scheduled at [facility].   LEXISCAN -  03/30/2023

## 2023-03-23 NOTE — Progress Notes (Signed)
Cardiology Office Note  Date: 03/23/2023   ID: Todd Gates, DOB 1945/06/22, MRN [id number]  PCP:  Billie Lade, MD  Cardiologist:  None Electrophysiologist:  None   History of Present Illness: Todd Gates is a 78 y.o. male known to have HTN, DM 2, OSA on CPAP was referred to cardiology clinic for evaluation of chest pain.  Patient had substernal chest tightness ongoing for the last couple of months, occurring few times per month, lasting for few minutes, occurs with both rest and exertion.  He typically noticed this chest tightness especially when he was lifting weights.  He exercises/goes to gym 3 times per week.  No other symptoms of DOE, fatigue, dizziness, lightheadedness, syncope or leg swelling.  Uses CPAP at night.  Past Medical History:  Diagnosis Date   Arthritis    Cancer [facility])    prostate; prostatectomy; radiation   Diabetes mellitus without complication (HCC)    TYPE 2   Fatty liver    Glaucoma    Both eyes   Gout    HTN (hypertension)    Peptic ulcer disease    Pneumonia    HX OF AS A CHILD   Sleep apnea    HAS CPAP    Past Surgical History:  Procedure Laterality Date   COLONOSCOPY W/ POLYPECTOMY  02/2020   PROSTATE SURGERY     PROSTATECTOMY  2008   ROBOTIC ASSITED PARTIAL NEPHRECTOMY Right 06/24/2021   Procedure: XI ROBOTIC ASSITED PARTIAL NEPHRECTOMY;  Surgeon: Rene Paci, MD;  Location: [facility];  Service: Urology;  Laterality: Right;   TONSILLECTOMY     TOTAL HIP ARTHROPLASTY Left 10/15/2020   Procedure: LEFT TOTAL HIP ARTHROPLASTY ANTERIOR APPROACH;  Surgeon: Kathryne Hitch, MD;  Location: [facility] OR;  Service: Orthopedics;  Laterality: Left;    Current Outpatient Medications  Medication Sig Dispense Refill   atorvastatin (LIPITOR) 80 MG tablet Take 1 tablet (80 mg total) by mouth daily. 90 tablet 3   Brinzolamide-Brimonidine (SIMBRINZA) 1-0.2 % SUSP Place 1 drop into both eyes in the morning, at noon, and at bedtime. 8 mL 0    Calcium Carbonate (CALCIUM 600 PO) Take 600 mg by mouth daily.     ezetimibe (ZETIA) 10 MG tablet Take 1 tablet (10 mg total) by mouth daily. 90 tablet 3   febuxostat (ULORIC) 40 MG tablet Take 40 mg by mouth daily as needed (gout flares).     furosemide (LASIX) 20 MG tablet Take 20 mg by mouth daily.     gabapentin (NEURONTIN) 100 MG capsule Take 1 capsule (100 mg total) by mouth at bedtime. 90 capsule 3   JARDIANCE 25 MG TABS tablet TAKE 1 TABLET BY MOUTH DAILY BEFORE BREAKFAST 30 tablet 2   labetalol (NORMODYNE) 100 MG tablet Take 1 tablet (100 mg total) by mouth 2 (two) times daily. 180 tablet 1   MOLNUPIRAVIR PO Take 4 capsules by mouth every 12 (twelve) hours. For 5 days     olmesartan (BENICAR) 20 MG tablet Take 1 tablet (20 mg total) by mouth daily. 90 tablet 1   No current facility-administered medications for this visit.   Allergies:  Feraheme [ferumoxytol]   Social History: The patient  reports that he has quit smoking. His smoking use included cigarettes. He has never used smokeless tobacco. He reports that he does not drink alcohol and does not use drugs.   Family History: The patient's family history includes Hypertension in his unknown relative.   ROS:  Please see  the history of present illness. Otherwise, complete review of systems is positive for none.  All other systems are reviewed and negative.   Physical Exam: VS:  BP 134/72   Pulse (!) 54   Ht 5' 8.5" (1.74 m)   Wt 209 lb 3.2 oz (94.9 kg)   SpO2 97%   BMI 31.35 kg/m , BMI Body mass index is 31.35 kg/m.  Wt Readings from Last 3 Encounters:  03/23/23 209 lb 3.2 oz (94.9 kg)  03/01/23 209 lb 3.2 oz (94.9 kg)  11/05/22 200 lb 1.3 oz (90.8 kg)    General: Patient appears comfortable at rest. HEENT: Conjunctiva and lids normal, oropharynx clear with moist mucosa. Neck: Supple, no elevated JVP or carotid bruits, no thyromegaly. Lungs: Clear to auscultation, nonlabored breathing at rest. Cardiac: Regular rate and  rhythm, no S3 or significant systolic murmur, no pericardial rub. Abdomen: Soft, nontender, no hepatomegaly, bowel sounds present, no guarding or rebound. Extremities: No pitting edema, distal pulses 2+. Skin: Warm and dry. Musculoskeletal: No kyphosis. Neuropsychiatric: Alert and oriented x3, affect grossly appropriate.  Recent Labwork: 03/01/2023: ALT 13; AST 22; BUN 24; Creatinine, Ser 1.77; Hemoglobin 11.3; Platelets 197; Potassium 4.0; Sodium 145; TSH 1.160     Component Value Date/Time   CHOL 207 (H) 03/01/2023 1330   TRIG 269 (H) 03/01/2023 1330   HDL 49 03/01/2023 1330   CHOLHDL 4.2 03/01/2023 1330   LDLCALC 112 (H) 03/01/2023 1330     Assessment and Plan:  Chest tightness: Ongoing chest tightness x couple of months, occurring with both rest and exertion (while lifting weights), lasting for few minutes and resolving spontaneously.  Occurs couple of times per month.  Not frequent.  Due to cardiac risk factors of HTN, DM2, he will benefit from [place].  Cannot do exercise Myoview due to joint issues.  No murmur on physical exam.  EKG showed NSR, no ischemia.  Sinus bradycardia: Patient extremely worried about low HR.  Provided reassurance.  Educated about the symptoms of severe sinus bradycardia and provided ER precautions.  HLD and hypertriglyceridemia: Continue atorvastatin 80 mg nightly and start Zetia 10 mg once daily.  Aggressive control of DM 2.  HbA1c 8.1 recently.  Follow-up with PCP.  OSA on CPAP: Continue CPAP.  HTN, controlled: Continue current antihypertensives, p.o. Lasix 20 mg once daily, labetalol 100 mg twice daily and olmesartan 20 mg once daily.  DM 2, poorly controlled: HbA1c 8.1.  Continue Jardiance 25 mg once daily, follow with PCP.  CKD stage IIIb: Follow-up with PCP.    Medication Adjustments/Labs and Tests Ordered: Current medicines are reviewed at length with the patient today.  Concerns regarding medicines are outlined above.    Disposition:   Follow up 6 months  Signed, Myleen Brailsford Verne Spurr, MD, 03/23/2023 4:45 PM    [facility] [address] [place], [place] [address]

## 2023-03-30 ENCOUNTER — Ambulatory Visit (HOSPITAL_COMMUNITY)
Admission: RE | Admit: 2023-03-30 | Discharge: 2023-03-30 | Disposition: A | Discharge status: Home / Self Care | Payer: No Typology Code available for payment source | Source: Ambulatory Visit | Attending: Internal Medicine | Admitting: Internal Medicine

## 2023-03-30 ENCOUNTER — Encounter (HOSPITAL_COMMUNITY)
Admission: RE | Admit: 2023-03-30 | Discharge: 2023-03-30 | Disposition: A | Discharge status: Home / Self Care | Payer: Medicare HMO | Source: Ambulatory Visit | Attending: Internal Medicine | Admitting: Internal Medicine

## 2023-03-30 DIAGNOSIS — I10 Essential (primary) hypertension: Secondary | ICD-10-CM | POA: Insufficient documentation

## 2023-03-30 DIAGNOSIS — R0789 Other chest pain: Secondary | ICD-10-CM | POA: Insufficient documentation

## 2023-03-30 LAB — NM MYOCAR MULTI W/SPECT W/WALL MOTION / EF
LV dias vol: 58 mL (ref 62–150)
LV sys vol: 17 mL
Nuc Stress EF: 70 %
Peak HR: 52 {beats}/min
RATE: 0.5
Rest HR: 47 {beats}/min
Rest Nuclear Isotope Dose: 10.7 mCi
SDS: 1
SRS: 1
SSS: 2
ST Depression (mm): 0 mm
Stress Nuclear Isotope Dose: 30.6 mCi
TID: 1.57

## 2023-03-30 MED ORDER — TECHNETIUM TC 99M TETROFOSMIN IV KIT
10.7000 | PACK | Freq: Once | INTRAVENOUS | Status: AC | PRN
  Administered 2023-03-30: 10.7 via INTRAVENOUS

## 2023-03-30 MED ORDER — TECHNETIUM TC 99M TETROFOSMIN IV KIT
30.6000 | PACK | Freq: Once | INTRAVENOUS | Status: AC | PRN
  Administered 2023-03-30: 30.6 via INTRAVENOUS

## 2023-03-30 MED ORDER — SODIUM CHLORIDE FLUSH 0.9 % IV SOLN
INTRAVENOUS | Status: AC
  Administered 2023-03-30: 10 mL via INTRAVENOUS
  Filled 2023-03-30: qty 10

## 2023-03-30 MED ORDER — REGADENOSON 0.4 MG/5ML IV SOLN
INTRAVENOUS | Status: AC
  Administered 2023-03-30: 0.4 mg via INTRAVENOUS
  Filled 2023-03-30: qty 5

## 2023-04-01 ENCOUNTER — Telehealth: Payer: Self-pay

## 2023-04-01 NOTE — Telephone Encounter (Signed)
 Patient informed and verbalized understanding of plan. Patient declined Nitroglycerin 

## 2023-04-01 NOTE — Telephone Encounter (Signed)
-----   Message from Vishnu P Mallipeddi sent at 04/01/2023  9:16 AM EST ----- Normal stress test. SL NTG 0.4 mg PRN for chest pains. Provide ER precautions for chest pain.

## 2023-04-12 ENCOUNTER — Other Ambulatory Visit: Payer: Self-pay | Admitting: Internal Medicine

## 2023-04-30 ENCOUNTER — Other Ambulatory Visit: Payer: Self-pay | Admitting: Internal Medicine

## 2023-04-30 DIAGNOSIS — N1832 Chronic kidney disease, stage 3b: Secondary | ICD-10-CM

## 2023-05-10 ENCOUNTER — Ambulatory Visit: Payer: No Typology Code available for payment source | Admitting: Internal Medicine

## 2023-05-10 DIAGNOSIS — E119 Type 2 diabetes mellitus without complications: Secondary | ICD-10-CM | POA: Diagnosis not present

## 2023-05-10 DIAGNOSIS — L851 Acquired keratosis [keratoderma] palmaris et plantaris: Secondary | ICD-10-CM | POA: Diagnosis not present

## 2023-05-10 DIAGNOSIS — B351 Tinea unguium: Secondary | ICD-10-CM | POA: Diagnosis not present

## 2023-05-17 ENCOUNTER — Ambulatory Visit (INDEPENDENT_AMBULATORY_CARE_PROVIDER_SITE_OTHER): Payer: Self-pay | Admitting: Internal Medicine

## 2023-05-17 ENCOUNTER — Encounter: Payer: Self-pay | Admitting: Internal Medicine

## 2023-05-17 VITALS — BP 140/72 | HR 55 | Ht 68.0 in | Wt 204.2 lb

## 2023-05-17 DIAGNOSIS — I10 Essential (primary) hypertension: Secondary | ICD-10-CM | POA: Diagnosis not present

## 2023-05-17 MED ORDER — HYDROCHLOROTHIAZIDE 12.5 MG PO TABS: 12.5000 mg | ORAL_TABLET | Freq: Every day | ORAL | 3 refills | Status: AC

## 2023-05-17 NOTE — Progress Notes (Unsigned)
 Acute Office Visit  Subjective:     Patient ID: Todd Gates, male    DOB: 08-10-1945, 78 y.o.   MRN: [id number]  Chief Complaint  Patient presents with   Hypertension    Pt. Wants to talk about changing b/p meds   Mr. Chillemi presents today for an acute visit to discuss hypertension.  He was last evaluated by me on 1/6 at which time his blood pressure was adequately controlled on a regimen of olmesartan 20 mg daily, labetalol 100 mg twice daily, and Lasix 20 mg daily.  He was evaluated by cardiology on 1/28 and no medication changes were made.  Today he states that he has been taking olmesartan and labetalol as prescribed but has stopped Lasix.  He is concerned that his blood pressure remains mildly elevated and he is interested in making adjustments.  He does not have any additional concerns to discuss today.  Review of Systems  Constitutional:  Negative for chills and fever.  HENT:  Negative for sore throat.   Respiratory:  Negative for cough and shortness of breath.   Cardiovascular:  Negative for chest pain, palpitations and leg swelling.  Gastrointestinal:  Negative for abdominal pain, blood in stool, constipation, diarrhea, nausea and vomiting.  Genitourinary:  Negative for dysuria and hematuria.  Musculoskeletal:  Negative for myalgias.  Skin:  Negative for itching and rash.  Neurological:  Negative for dizziness and headaches.  Psychiatric/Behavioral:  Negative for depression and suicidal ideas.       Objective:    BP (!) 140/72   Pulse (!) 55   Ht 5\' 8"  (1.727 m)   Wt 204 lb 3.2 oz (92.6 kg)   SpO2 95%   BMI 31.05 kg/m  BP Readings from Last 3 Encounters:  05/17/23 (!) 140/72  03/23/23 134/72  03/01/23 135/62   Physical Exam Vitals reviewed.  Constitutional:      General: He is not in acute distress.    Appearance: Normal appearance. He is obese. He is not ill-appearing.  HENT:     Head: Normocephalic and atraumatic.     Right Ear: External ear normal.      Left Ear: External ear normal.     Nose: Nose normal. No congestion or rhinorrhea.     Mouth/Throat:     Mouth: Mucous membranes are moist.     Pharynx: Oropharynx is clear.  Eyes:     General: No scleral icterus.    Extraocular Movements: Extraocular movements intact.     Conjunctiva/sclera: Conjunctivae normal.     Pupils: Pupils are equal, round, and reactive to light.  Cardiovascular:     Rate and Rhythm: Normal rate and regular rhythm.     Pulses: Normal pulses.     Heart sounds: Normal heart sounds. No murmur heard. Pulmonary:     Effort: Pulmonary effort is normal.     Breath sounds: Normal breath sounds. No wheezing, rhonchi or rales.  Abdominal:     General: Abdomen is flat. Bowel sounds are normal. There is no distension.     Palpations: Abdomen is soft.     Tenderness: There is no abdominal tenderness.  Musculoskeletal:        General: No swelling or deformity. Normal range of motion.     Cervical back: Normal range of motion.  Skin:    General: Skin is warm and dry.     Capillary Refill: Capillary refill takes less than 2 seconds.  Neurological:     General: No focal  deficit present.     Mental Status: He is alert and oriented to person, place, and time.     Motor: No weakness.  Psychiatric:        Mood and Affect: Mood normal.        Behavior: Behavior normal.        Thought Content: Thought content normal.       Assessment & Plan:   Problem List Items Addressed This Visit       Essential hypertension - Primary (Chronic)   Presenting today for an acute visit to discuss hypertension.  He is currently prescribed olmesartan, labetalol, and Lasix but states that he is not taking Lasix regularly.  His blood pressure is mildly elevated today.  He is interested in making adjustments to improve HTN control.  He has previously documented allergies to amlodipine and lisinopril. -Treatment options reviewed.  Will add HCTZ 12.5 mg daily for improved HTN control.   Continue olmesartan 20 mg daily and labetalol 100 mg twice daily.  Okay to use Lasix as needed for lower extremity edema.  He will return to care for routine follow-up on 5/6 as previously scheduled.      Meds ordered this encounter  Medications   hydrochlorothiazide (HYDRODIURIL) 12.5 MG tablet    Sig: Take 1 tablet (12.5 mg total) by mouth daily.    Dispense:  90 tablet    Refill:  3    Return if symptoms worsen or fail to improve.  Billie Lade, MD

## 2023-05-17 NOTE — Patient Instructions (Signed)
 It was a pleasure to see you today.  Thank you for giving [place] the opportunity to be involved in your care.  Below is a brief recap of your visit and next steps.  We will plan to see you again in May.  Summary Add hydrochlorothiazide 12.5 mg daily for hypertension Continue labetalol and olmesartan daily Use furosemide daily as needed for swelling in your legs Follow up as scheduled in May

## 2023-05-18 NOTE — Assessment & Plan Note (Signed)
 Presenting today for an acute visit to discuss hypertension.  He is currently prescribed olmesartan, labetalol, and Lasix but states that he is not taking Lasix regularly.  His blood pressure is mildly elevated today.  He is interested in making adjustments to improve HTN control.  He has previously documented allergies to amlodipine and lisinopril. -Treatment options reviewed.  Will add HCTZ 12.5 mg daily for improved HTN control.  Continue olmesartan 20 mg daily and labetalol 100 mg twice daily.  Okay to use Lasix as needed for lower extremity edema.  He will return to care for routine follow-up on 5/6 as previously scheduled.

## 2023-06-02 ENCOUNTER — Telehealth: Payer: Self-pay

## 2023-06-02 NOTE — Telephone Encounter (Signed)
 Patient was identified as falling into the [place] Measure - Diabetes.   Patient was: Appointment already scheduled for:  06/29/23.

## 2023-06-10 DIAGNOSIS — H1013 Acute atopic conjunctivitis, bilateral: Secondary | ICD-10-CM | POA: Diagnosis not present

## 2023-06-10 DIAGNOSIS — J302 Other seasonal allergic rhinitis: Secondary | ICD-10-CM | POA: Diagnosis not present

## 2023-06-10 DIAGNOSIS — Z8669 Personal history of other diseases of the nervous system and sense organs: Secondary | ICD-10-CM | POA: Diagnosis not present

## 2023-06-11 DIAGNOSIS — Z20822 Contact with and (suspected) exposure to covid-19: Secondary | ICD-10-CM | POA: Diagnosis not present

## 2023-06-14 ENCOUNTER — Ambulatory Visit (INDEPENDENT_AMBULATORY_CARE_PROVIDER_SITE_OTHER): Payer: Medicare HMO

## 2023-06-14 VITALS — Ht 68.0 in | Wt 204.0 lb

## 2023-06-14 DIAGNOSIS — Z0000 Encounter for general adult medical examination without abnormal findings: Secondary | ICD-10-CM | POA: Diagnosis not present

## 2023-06-14 DIAGNOSIS — Z1159 Encounter for screening for other viral diseases: Secondary | ICD-10-CM

## 2023-06-14 NOTE — Patient Instructions (Signed)
 Mr. Cryer , Thank you for taking time to come for your Medicare Wellness Visit. I appreciate your ongoing commitment to your health goals. Please review the following plan we discussed and let me know if I can assist you in the future.   Referrals/Orders/Follow-Ups/Clinician Recommendations:  Next Medicare AWV: June 14, 2024 at 8:00 am telephone visit.   Aim for 30 minutes of exercise or brisk walking, 6-8 glasses of water , and 5 servings of fruits and vegetables each day.   This is a list of the screening recommended for you and due dates:  Health Maintenance  Topic Date Due   Eye exam for diabetics  Never done   Hepatitis C Screening  Never done   COVID-19 Vaccine (7 - Moderna risk 2024-25 season) 07/09/2023   Hemoglobin A1C  08/29/2023   Flu Shot  09/24/2023   Yearly kidney health urinalysis for diabetes  12/22/2023   Yearly kidney function blood test for diabetes  02/29/2024   Complete foot exam   05/09/2024   Medicare Annual Wellness Visit  06/13/2024   DTaP/Tdap/Td vaccine (4 - Td or Tdap) 02/19/2030   Pneumonia Vaccine  Completed   Zoster (Shingles) Vaccine  Completed   HPV Vaccine  Aged Out   Meningitis B Vaccine  Aged Out   Colon Cancer Screening  Discontinued    Advanced directives: (Declined) Advance directive discussed with you today. Even though you declined this today, please call our office should you change your mind, and we can give you the proper paperwork for you to fill out. Advance Care Planning is important because it:  [x]  Makes sure you receive the medical care that is consistent with your values, goals, and preferences  [x]  It provides guidance to your family and loved ones and it also reduces their decisional burden about whether or not they are making the right decisions based on what you want done  Follow the link provided in your after visit summary or read over the paperwork we have mailed to you to help you started getting your Advance Directives in  place. If you need assistance in completing these, please reach out to us  so that we can help you!   Next Medicare Annual Wellness Visit scheduled for next year: yes  Understanding Your Risk for Falls Millions of people have serious injuries from falls each year. It is important to understand your risk of falling. Talk with your health care provider about your risk and what you can do to lower it. If you do have a serious fall, make sure to tell your provider. Falling once raises your risk of falling again. How can falls affect me? Serious injuries from falls are common. These include: Broken bones, such as hip fractures. Head injuries, such as traumatic brain injuries (TBI) or concussions. A fear of falling can cause you to avoid activities and stay at home. This can make your muscles weaker and raise your risk for a fall. What can increase my risk? There are a number of risk factors that increase your risk for falling. The more risk factors you have, the higher your risk of falling. Serious injuries from a fall happen most often to people who are older than 78 years old. Teenagers and young adults ages 55-29 are also at higher risk. Common risk factors include: Weakness in the lower body. Being generally weak or confused due to long-term (chronic) illness. Dizziness or balance problems. Poor vision. Medicines that cause dizziness or drowsiness. These may include: Medicines for  your blood pressure, heart, anxiety, insomnia, or swelling (edema). Pain medicines. Muscle relaxants. Other risk factors include: Drinking alcohol . Having had a fall in the past. Having foot pain or wearing improper footwear. Working at a dangerous job. Having any of the following in your home: Tripping hazards, such as floor clutter or loose rugs. Poor lighting. Pets. Having dementia or memory loss. What actions can I take to lower my risk of falling?     Physical activity Stay physically fit. Do  strength and balance exercises. Consider taking a regular class to build strength and balance. Yoga and tai chi are good options. Vision Have your eyes checked every year and your prescription for glasses or contacts updated as needed. Shoes and walking aids Wear non-skid shoes. Wear shoes that have rubber soles and low heels. Do not wear high heels. Do not walk around the house in socks or slippers. Use a cane or walker as told by your provider. Home safety Attach secure railings on both sides of your stairs. Install grab bars for your bathtub, shower, and toilet. Use a non-skid mat in your bathtub or shower. Attach bath mats securely with double-sided, non-slip rug tape. Use good lighting in all rooms. Keep a flashlight near your bed. Make sure there is a clear path from your bed to the bathroom. Use night-lights. Do not use throw rugs. Make sure all carpeting is taped or tacked down securely. Remove all clutter from walkways and stairways, including extension cords. Repair uneven or broken steps and floors. Avoid walking on icy or slippery surfaces. Walk on the grass instead of on icy or slick sidewalks. Use ice melter to get rid of ice on walkways in the winter. Use a cordless phone. Questions to ask your health care provider Can you help me check my risk for a fall? Do any of my medicines make me more likely to fall? Should I take a vitamin D  supplement? What exercises can I do to improve my strength and balance? Should I make an appointment to have my vision checked? Do I need a bone density test to check for weak bones (osteoporosis)? Would it help to use a cane or a walker? Where to find more information Centers for Disease Control and Prevention, STEADI: TonerPromos.no Community-Based Fall Prevention Programs: TonerPromos.no [employer] on Aging: BaseRingTones.pl Contact a health care provider if: You fall at home. You are afraid of falling at home. You feel weak, drowsy, or  dizzy. This information is not intended to replace advice given to you by your health care provider. Make sure you discuss any questions you have with your health care provider. Document Revised: 10/13/2021 Document Reviewed: 10/13/2021 Elsevier Patient Education  2024 [employer].

## 2023-06-14 NOTE — Progress Notes (Signed)
 Because this visit was a virtual/telehealth visit,  certain criteria was not obtained, such a blood pressure, CBG if applicable, and timed get up and go. Any medications not marked as "taking" were not mentioned during the medication reconciliation part of the visit. Any vitals not documented were not able to be obtained due to this being a telehealth visit or patient was unable to self-report a recent blood pressure reading due to a lack of equipment at home via telehealth. Vitals that have been documented are verbally provided by the patient.   Subjective:   Todd Gates is a 78 y.o. who presents for a Medicare Wellness preventive visit.  Visit Complete: Virtual I connected with  Todd Gates on 06/14/23 by a audio enabled telemedicine application and verified that I am speaking with the correct person using two identifiers.  Patient Location: Home  Provider Location: Home Office  I discussed the limitations of evaluation and management by telemedicine. The patient expressed understanding and agreed to proceed.  Vital Signs: Because this visit was a virtual/telehealth visit, some criteria may be missing or patient reported. Any vitals not documented were not able to be obtained and vitals that have been documented are patient reported.  VideoDeclined- This patient declined Librarian, academic. Therefore the visit was completed with audio only.  Persons Participating in Visit: Patient.  AWV Questionnaire: No: Patient Medicare AWV questionnaire was not completed prior to this visit.  Cardiac Risk Factors include: advanced age (>2men, >5 women);diabetes mellitus;hypertension;male gender;obesity (BMI >30kg/m2)     Objective:    Today's Vitals   06/14/23 0912  Weight: 204 lb (92.5 kg)  Height: 5\' 8"  (1.727 m)   Body mass index is 31.02 kg/m.     06/14/2023    9:13 AM 06/03/2022    3:48 PM 12/27/2021   10:04 AM 06/24/2021    6:09 AM 06/11/2021    1:52 PM  04/18/2021    9:16 AM 04/08/2021   11:18 AM  Advanced Directives  Does Patient Have a Medical Advance Directive? No No No No No No No  Would patient like information on creating a medical advance directive? No - Patient declined No - Patient declined  No - Patient declined  No - Patient declined No - Patient declined    Current Medications (verified) Outpatient Encounter Medications as of 06/14/2023  Medication Sig   atorvastatin  (LIPITOR) 80 MG tablet Take 1 tablet (80 mg total) by mouth daily.   Calcium  Carbonate (CALCIUM  600 PO) Take 600 mg by mouth daily.   ezetimibe  (ZETIA ) 10 MG tablet Take 1 tablet (10 mg total) by mouth daily.   furosemide (LASIX) 20 MG tablet Take 20 mg by mouth daily as needed for edema.   gabapentin  (NEURONTIN ) 100 MG capsule Take 1 capsule (100 mg total) by mouth at bedtime.   hydrochlorothiazide  (HYDRODIURIL ) 12.5 MG tablet Take 1 tablet (12.5 mg total) by mouth daily.   JARDIANCE  25 MG TABS tablet TAKE 1 TABLET BY MOUTH EVERY DAY BEFORE BREAKFAST   labetalol  (NORMODYNE ) 100 MG tablet Take 1 tablet (100 mg total) by mouth 2 (two) times daily.   MOLNUPIRAVIR PO Take 4 capsules by mouth every 12 (twelve) hours. For 5 days   olmesartan  (BENICAR ) 20 MG tablet Take 1 tablet (20 mg total) by mouth daily.   Semaglutide,0.25 or 0.5MG /DOS, (OZEMPIC, 0.25 OR 0.5 MG/DOSE,) 2 MG/3ML SOPN Inject 0.5 mg into the skin once a week.   SIMBRINZA  1-0.2 % SUSP INSTILL ONE DROP IN [facility]  EYE EVERY MORNING, NOON AND AT BEDTIME   febuxostat (ULORIC) 40 MG tablet Take 40 mg by mouth daily as needed (gout flares). (Patient not taking: Reported on 06/14/2023)   No facility-administered encounter medications on file as of 06/14/2023.    Allergies (verified) Feraheme  [ferumoxytol ]   History: Past Medical History:  Diagnosis Date   Arthritis    Cancer (HCC)    prostate; prostatectomy; radiation   Diabetes mellitus without complication (HCC)    TYPE 2   Fatty liver    Glaucoma     Both eyes   Gout    HTN (hypertension)    Peptic ulcer disease    Pneumonia    HX OF AS A CHILD   Sleep apnea    HAS CPAP   Past Surgical History:  Procedure Laterality Date   COLONOSCOPY W/ POLYPECTOMY  02/2020   PROSTATE SURGERY     PROSTATECTOMY  2008   ROBOTIC ASSITED PARTIAL NEPHRECTOMY Right 06/24/2021   Procedure: XI ROBOTIC ASSITED PARTIAL NEPHRECTOMY;  Surgeon: Adelbert Homans, MD;  Location: [facility];  Service: Urology;  Laterality: Right;   TONSILLECTOMY     TOTAL HIP ARTHROPLASTY Left 10/15/2020   Procedure: LEFT TOTAL HIP ARTHROPLASTY ANTERIOR APPROACH;  Surgeon: Arnie Lao, MD;  Location: [facility] OR;  Service: Orthopedics;  Laterality: Left;   Family History  Problem Relation Age of Onset   Hypertension Unknown    Social History   Socioeconomic History   Marital status: Widowed    Spouse name: Not on file   Number of children: Not on file   Years of education: Not on file   Highest education level: Not on file  Occupational History   Not on file  Tobacco Use   Smoking status: Former    Types: Cigarettes   Smokeless tobacco: Never  Vaping Use   Vaping status: Not on file  Substance and Sexual Activity   Alcohol  use: No   Drug use: No   Sexual activity: Not on file  Other Topics Concern   Not on file  Social History Narrative   Not on file   Social Drivers of Health   Financial Resource Strain: Low Risk  (06/14/2023)   Overall Financial Resource Strain (CARDIA)    Difficulty of Paying Living Expenses: Not hard at all  Food Insecurity: No Food Insecurity (06/14/2023)   Hunger Vital Sign    Worried About Running Out of Food in the Last Year: Never true    Ran Out of Food in the Last Year: Never true  Transportation Needs: No Transportation Needs (06/14/2023)   PRAPARE - Administrator, Civil Service (Medical): No    Lack of Transportation (Non-Medical): No  Physical Activity: Sufficiently Active (06/14/2023)   Exercise Vital  Sign    Days of Exercise per Week: 7 days    Minutes of Exercise per Session: 30 min  Stress: No Stress Concern Present (06/14/2023)   [employer] of Occupational Health - Occupational Stress Questionnaire    Feeling of Stress : Not at all  Social Connections: Socially Integrated (06/14/2023)   Social Connection and Isolation Panel [NHANES]    Frequency of Communication with Friends and Family: More than three times a week    Frequency of Social Gatherings with Friends and Family: More than three times a week    Attends Religious Services: More than 4 times per year    Active Member of [employer] or Organizations: Yes    Attends [employer]  or Organization Meetings: More than 4 times per year    Marital Status: Married    Tobacco Counseling Counseling given: Yes    Clinical Intake:  Pre-visit preparation completed: Yes  Pain : No/denies pain     BMI - recorded: 31.02 Nutritional Status: BMI > 30  Obese Nutritional Risks: None Diabetes: No  Lab Results  Component Value Date   HGBA1C 8.1 (H) 03/01/2023   HGBA1C 7.2 (A) 08/24/2022   HGBA1C 7.4 (H) 04/22/2022     How often do you need to have someone help you when you read instructions, pamphlets, or other written materials from your doctor or pharmacy?: 1 - Never  Interpreter Needed?: No  Information entered by :: Sally Crazier CMA   Activities of Daily Living     06/14/2023    9:12 AM  In your present state of health, do you have any difficulty performing the following activities:  Hearing? 0  Vision? 0  Difficulty concentrating or making decisions? 0  Walking or climbing stairs? 0  Dressing or bathing? 0  Doing errands, shopping? 0  Preparing Food and eating ? N  Using the Toilet? N  In the past six months, have you accidently leaked urine? N  Do you have problems with loss of bowel control? N  Managing your Medications? N  Managing your Finances? N  Housekeeping or managing your Housekeeping? N    Patient Care  Team: Tobi Fortes, MD as PCP - General (Internal Medicine) Lasalle Pointer, MD as PCP - Cardiology (Cardiology) Paulett Boros, MD as Medical Oncologist (Medical Oncology)  Indicate any recent Medical Services you may have received from other than [facility] providers in the past year (date may be approximate).     Assessment:   This is a routine wellness examination for Todd Gates.  Hearing/Vision screen Hearing Screening - Comments:: Patient denies any hearing difficulties.   Vision Screening - Comments:: Patient is up to date with eye exams. He stated he gets an exam Q 6 mths at [facility]. Next appt is May 2025   Goals Addressed             This Visit's Progress    Patient Stated       To go to [place]        Depression Screen     06/14/2023    9:14 AM 05/17/2023    2:17 PM 03/01/2023    1:08 PM 01/08/2023    3:29 PM 11/05/2022    9:11 AM 08/24/2022    1:06 PM 06/03/2022    3:49 PM  PHQ 2/9 Scores  PHQ - 2 Score 0  0 0 0 0 0  PHQ- 9 Score 0    0    Exception Documentation  Patient refusal         Fall Risk     06/14/2023    9:14 AM 03/01/2023    1:08 PM 01/08/2023    3:29 PM 11/05/2022    9:11 AM 08/24/2022    1:06 PM  Fall Risk   Falls in the past year? 0 0 0 0 0  Number falls in past yr: 0 0 0  0  Injury with Fall? 0 0 0  0  Risk for fall due to : No Fall Risks No Fall Risks     Follow up Falls prevention discussed;Falls evaluation completed Falls evaluation completed       MEDICARE RISK AT HOME:  Medicare Risk at Home Any stairs  in or around the home?: Yes If so, are there any without handrails?: No Home free of loose throw rugs in walkways, pet beds, electrical cords, etc?: Yes Adequate lighting in your home to reduce risk of falls?: Yes Life alert?: No Use of a cane, walker or w/c?: No Grab bars in the bathroom?: Yes Shower chair or bench in shower?: No Elevated toilet seat or a handicapped toilet?: No  TIMED UP AND GO:  Was the test  performed?  No  Cognitive Function: 6CIT completed        06/14/2023    9:14 AM 06/03/2022    3:50 PM  6CIT Screen  What Year? 0 points 0 points  What month? 0 points 0 points  What time? 0 points 0 points  Count back from 20 0 points 0 points  Months in reverse 0 points 0 points  Repeat phrase 0 points 0 points  Total Score 0 points 0 points    Immunizations Immunization History  Administered Date(s) Administered   DTaP 10/20/2007   Influenza, High Dose Seasonal PF 11/04/2017   Influenza,inj,quad, With Preservative 10/20/2016   Influenza-Unspecified 11/09/2022   Moderna Covid-19 Fall Seasonal Vaccine 58yrs & older 01/09/2023   Moderna Covid-19 Vaccine Bivalent Booster 23yrs & up 01/11/2020, 12/16/2021   Moderna Sars-Covid-2 Vaccination 03/31/2019, 04/29/2019, 08/12/2020   Pneumococcal Conjugate-13 04/10/2010   Pneumococcal Polysaccharide-23 06/19/2021   Td (Adult),5 Lf Tetanus Toxid, Preservative Free 07/08/2004, 04/11/2012   Tdap 02/20/2020   Zoster Recombinant(Shingrix) 10/24/2019, 02/20/2020    Screening Tests Health Maintenance  Topic Date Due   OPHTHALMOLOGY EXAM  Never done   Hepatitis C Screening  Never done   COVID-19 Vaccine (7 - Moderna risk 2024-25 season) 07/09/2023   HEMOGLOBIN A1C  08/29/2023   INFLUENZA VACCINE  09/24/2023   Diabetic kidney evaluation - Urine ACR  12/22/2023   Diabetic kidney evaluation - eGFR measurement  02/29/2024   FOOT EXAM  05/09/2024   Medicare Annual Wellness (AWV)  06/13/2024   DTaP/Tdap/Td (4 - Td or Tdap) 02/19/2030   Pneumonia Vaccine 80+ Years old  Completed   Zoster Vaccines- Shingrix  Completed   HPV VACCINES  Aged Out   Meningococcal B Vaccine  Aged Out   Colonoscopy  Discontinued    Health Maintenance  Health Maintenance Due  Topic Date Due   OPHTHALMOLOGY EXAM  Never done   Hepatitis C Screening  Never done   Health Maintenance Items Addressed: Labs Ordered:, Hepatitis C Screening  Additional  Screening:  Vision Screening: Recommended annual ophthalmology exams for early detection of glaucoma and other disorders of the eye.  Dental Screening: Recommended annual dental exams for proper oral hygiene  Community Resource Referral / Chronic Care Management: CRR required this visit?  No   CCM required this visit?  No     Plan:     I have personally reviewed and noted the following in the patient's chart:   Medical and social history Use of alcohol , tobacco or illicit drugs  Current medications and supplements including opioid prescriptions. Patient is not currently taking opioid prescriptions. Functional ability and status Nutritional status Physical activity Advanced directives List of other physicians Hospitalizations, surgeries, and ER visits in previous 12 months Vitals Screenings to include cognitive, depression, and falls Referrals and appointments  In addition, I have reviewed and discussed with patient certain preventive protocols, quality metrics, and best practice recommendations. A written personalized care plan for preventive services as well as general preventive health recommendations were provided to patient.  Elsie Sakuma Myda Detwiler, CMA   06/14/2023   After Visit Summary: (MyChart) Due to this being a telephonic visit, the after visit summary with patients personalized plan was offered to patient via MyChart   Notes: Nothing significant to report at this time.

## 2023-06-24 ENCOUNTER — Other Ambulatory Visit: Payer: Self-pay | Admitting: Internal Medicine

## 2023-06-24 DIAGNOSIS — E1122 Type 2 diabetes mellitus with diabetic chronic kidney disease: Secondary | ICD-10-CM

## 2023-06-26 IMAGING — CT CT CHEST W/O CM
3 of 8 series · 15 of 36 positions shown, 17 images · non-contrast
Comparison: Abdomen MRI on 02/18/2021

CLINICAL DATA: Right renal mass on recent ultrasound. Previous
prostatectomy and radiation therapy for prostate carcinoma.

EXAM:
CT CHEST WITHOUT CONTRAST
CT ABDOMEN AND PELVIS WITH AND WITHOUT CONTRAST
TECHNIQUE: Multidetector CT imaging of the chest was performed prior to
intravenous contrast administration. Multidetector CT imaging of the
abdomen and pelvis was subsequently performed following the standard
protocol before and during bolus administration of intravenous
contrast.

[Series 2: routine chest without · axial · non-contrast · 0.74mm/px · z∈[+1231,+1429]mm · 7 of 133 slices shown, 9 images]
[im 17/133  mediastinal]
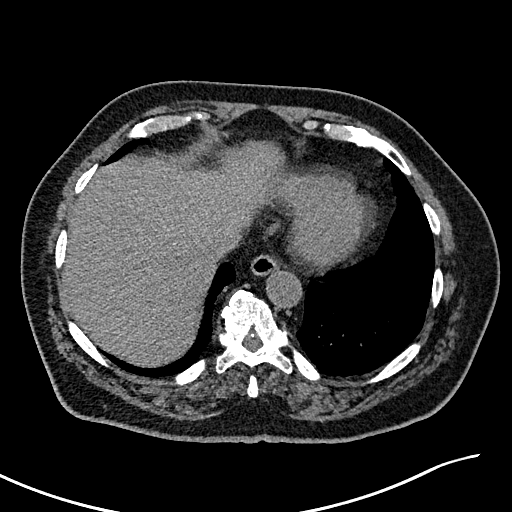
[im 17/133  lung]
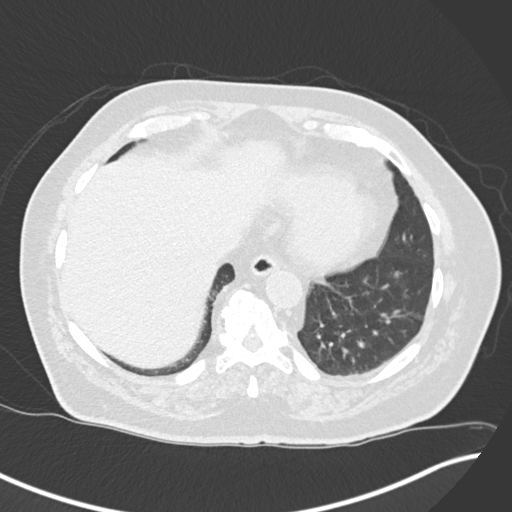
[im 34/133  lung]
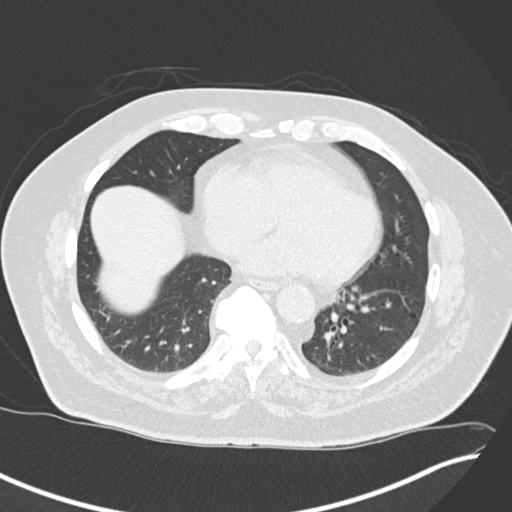
[im 50/133  lung]
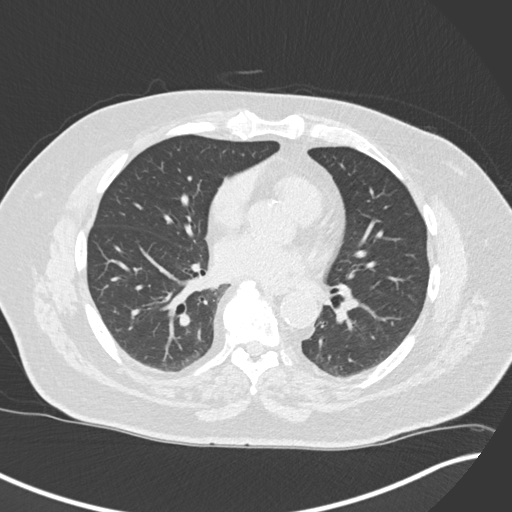
[im 67/133  lung]
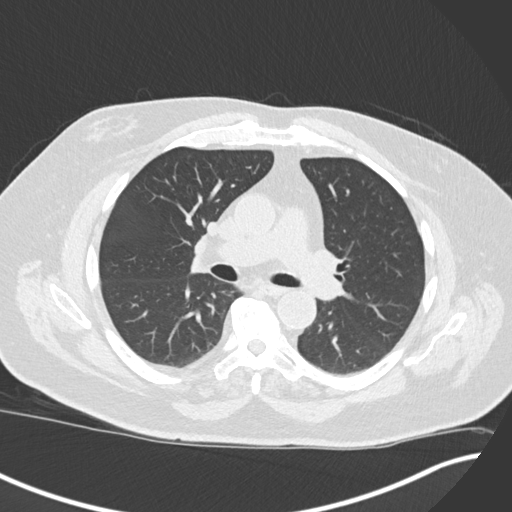
[im 83/133  mediastinal]
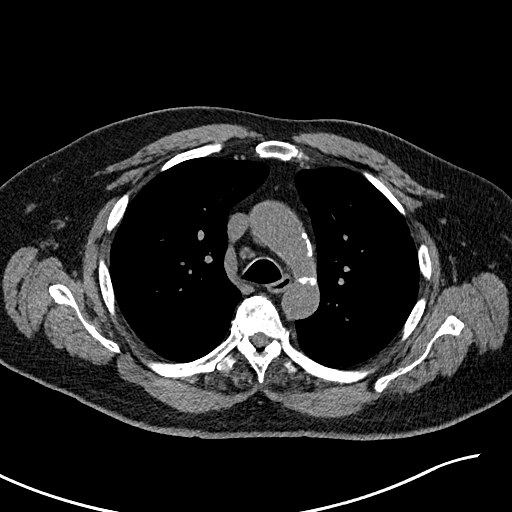
[im 83/133  lung]
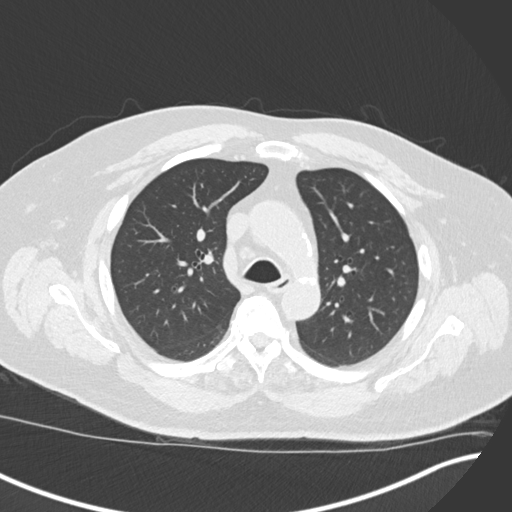
[im 100/133  lung]
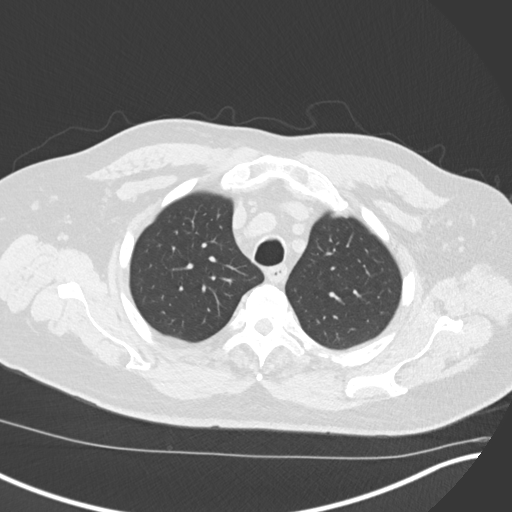
[im 116/133  lung]
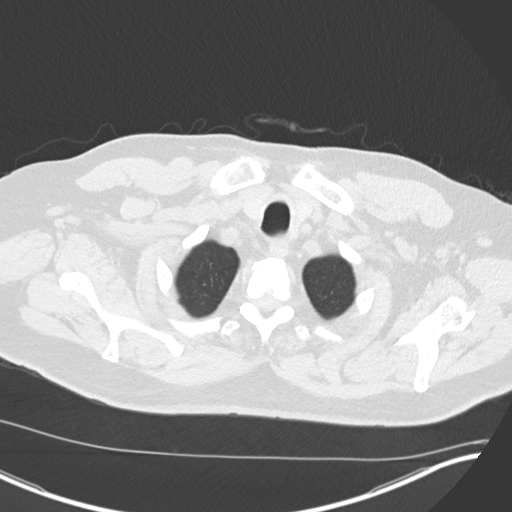

[Series 4: lungs · axial · 0.74mm/px · z∈[+1231,+1429]mm · 7 of 133 slices shown]
[im 17/133  lung]
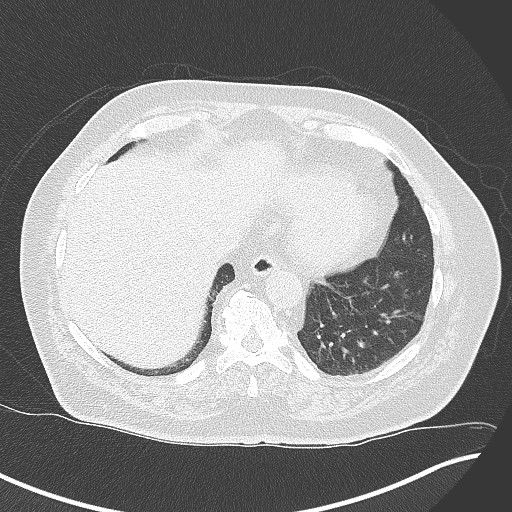
[im 34/133  lung]
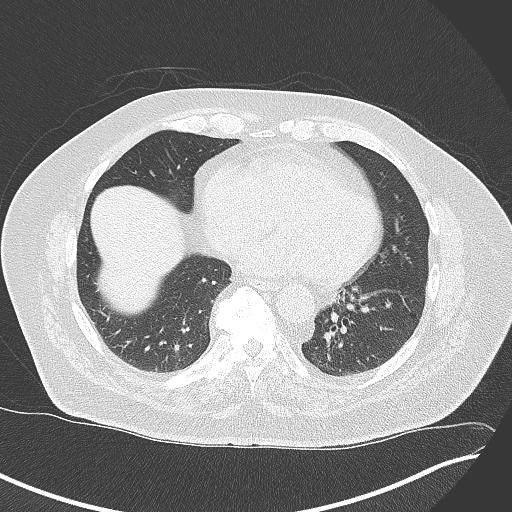
[im 50/133  lung]
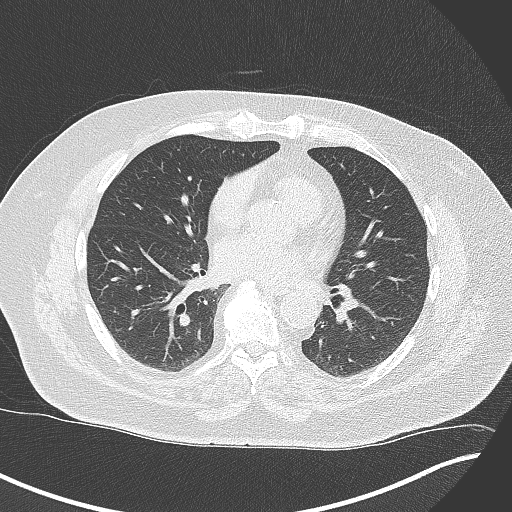
[im 67/133  lung]
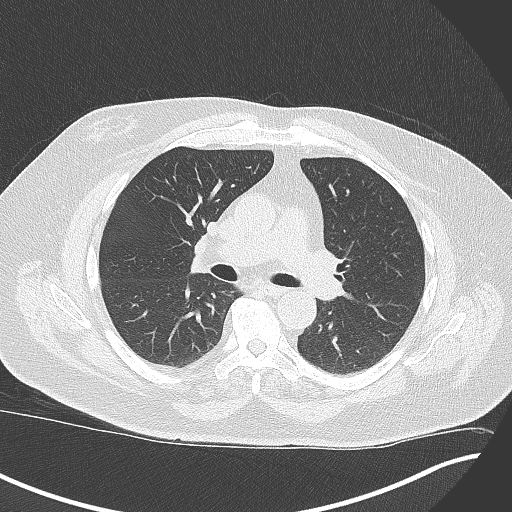
[im 83/133  lung]
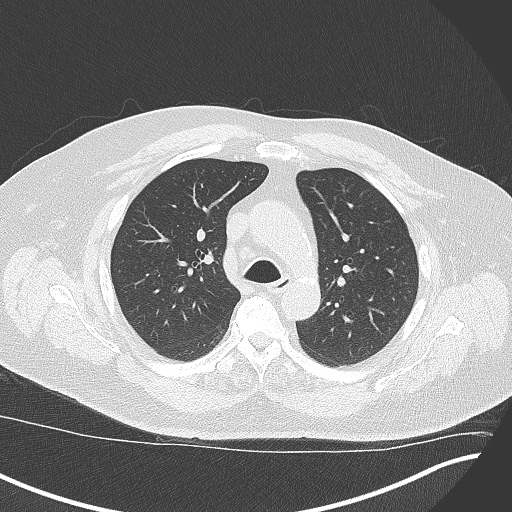
[im 100/133  lung]
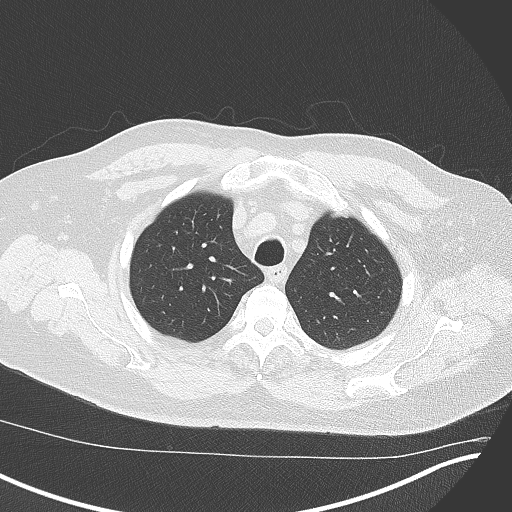
[im 116/133  lung]
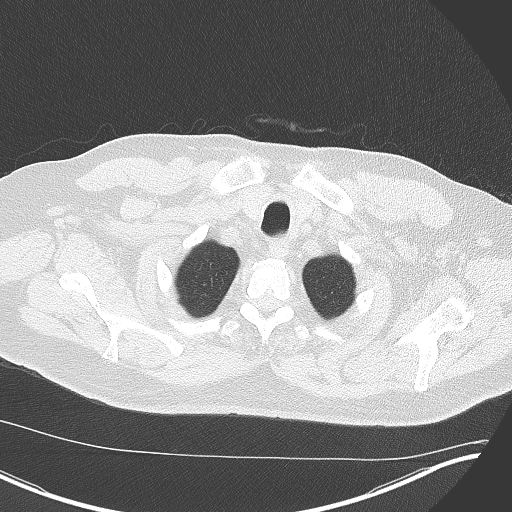

[Series 5: coronal · coronal · 0.57mm/px · 1 of 141 slices shown]
[im 71/141  lung]
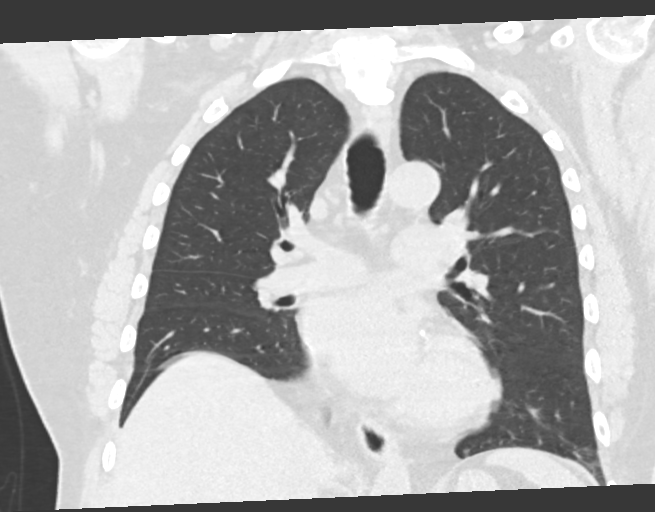

[15 of 36 positions shown; findings below may reference images not displayed]

RADIATION DOSE REDUCTION: This exam was performed according to the
departmental dose-optimization program which includes automated
exposure control, adjustment of the mA and/or kV according to
patient size and/or use of iterative reconstruction technique.

CONTRAST:  80mL OMNIPAQUE IOHEXOL 300 MG/ML  SOLN
FINDINGS: CT CHEST FINDINGS

Cardiovascular: No acute findings. Aortic and coronary
atherosclerotic calcification noted.

Mediastinum/Nodes: No masses or pathologically enlarged lymph nodes
identified on this unenhanced exam.

Lungs/Pleura: No suspicious nodules or masses identified. No
evidence of infiltrate or pleural effusion.

Musculoskeletal:  No suspicious bone lesions.

CT ABDOMEN AND PELVIS FINDINGS

Hepatobiliary: A few small hepatic cysts are stable. No hepatic
masses identified. Gallbladder is unremarkable. No evidence of
biliary ductal dilatation.

Pancreas:  No mass or inflammatory changes.

Spleen: Within normal limits in size and appearance.

Adrenals/Urinary Tract: Normal appearance the adrenal glands.
Probable tiny sub-cm cyst in midpole of left kidney remains stable.
A 1.1 cm subcapsular Bosniak category 2 cyst in the lateral midpole
of the right kidney remains stable. A subtle mass is again seen in
the lower pole of the right kidney which shows evidence of
heterogeneous contrast enhancement on subtraction imaging. This is
not as well visualized as on recent MR, but measures approximately
2.4 x 2.1 cm, without significant change. This remains suspicious
for renal cell carcinoma. No evidence of hydronephrosis.

Stomach/Bowel: No evidence of obstruction, inflammatory process or
abnormal fluid collections. Normal appendix visualized. Diffuse
colonic diverticulosis is again seen, without evidence of
diverticulitis.

Vascular/Lymphatic: No pathologically enlarged lymph nodes. No acute
vascular findings. Aortic atherosclerotic calcification noted.

Reproductive: Prior prostatectomy noted. Limited visualization due
to significant artifact from left hip prosthesis.

Other:  None.

Musculoskeletal:  No suspicious bone lesions identified.
IMPRESSION: 2.4 cm heterogeneously enhancing mass in lower pole of right kidney
is not as well visualized as on recent MR, but remains suspicious
for renal cell carcinoma.

No evidence of metastatic disease within the chest, abdomen, or
pelvis.

Colonic diverticulosis, without radiographic evidence of
diverticulitis.

Aortic Atherosclerosis ([id number]).

## 2023-06-26 IMAGING — CT CT ABD-PEL WO/W CM
2 of 13 series · 9 of 46 positions shown, 15 images · non-contrast
Comparison: Abdomen MRI on 02/18/2021

CLINICAL DATA: Right renal mass on recent ultrasound. Previous
prostatectomy and radiation therapy for prostate carcinoma.

EXAM:
CT CHEST WITHOUT CONTRAST
CT ABDOMEN AND PELVIS WITH AND WITHOUT CONTRAST
TECHNIQUE: Multidetector CT imaging of the chest was performed prior to
intravenous contrast administration. Multidetector CT imaging of the
abdomen and pelvis was subsequently performed following the standard
protocol before and during bolus administration of intravenous
contrast.

[Series 4: coronal pre · coronal · non-contrast · 0.49mm/px · 2 of 101 slices shown, 3 images]
[im 34/101  soft-tissue]
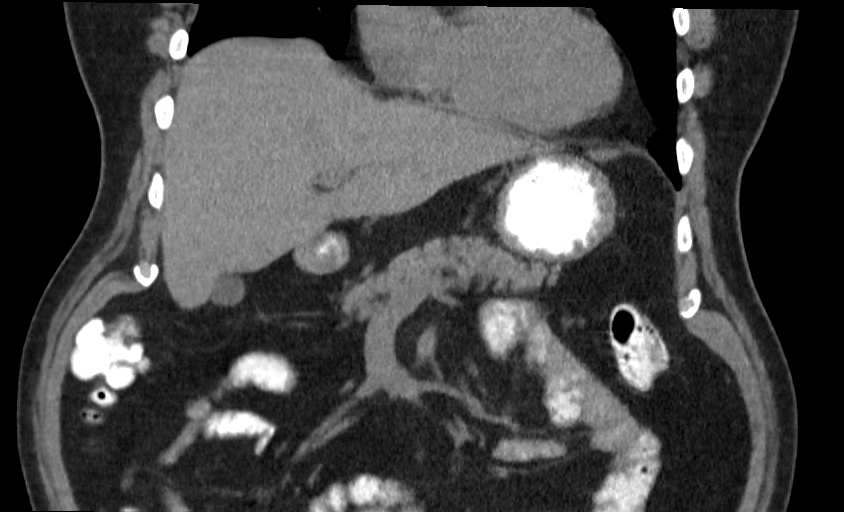
[im 34/101  bone]
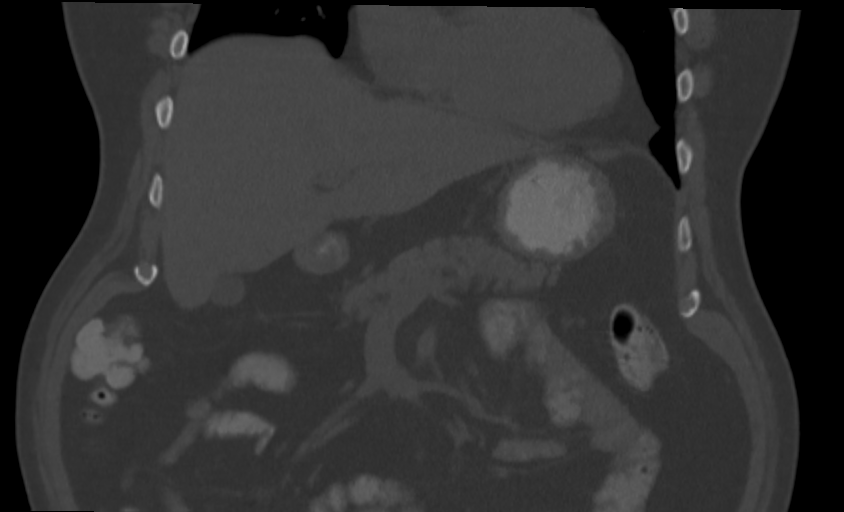
[im 67/101  soft-tissue]
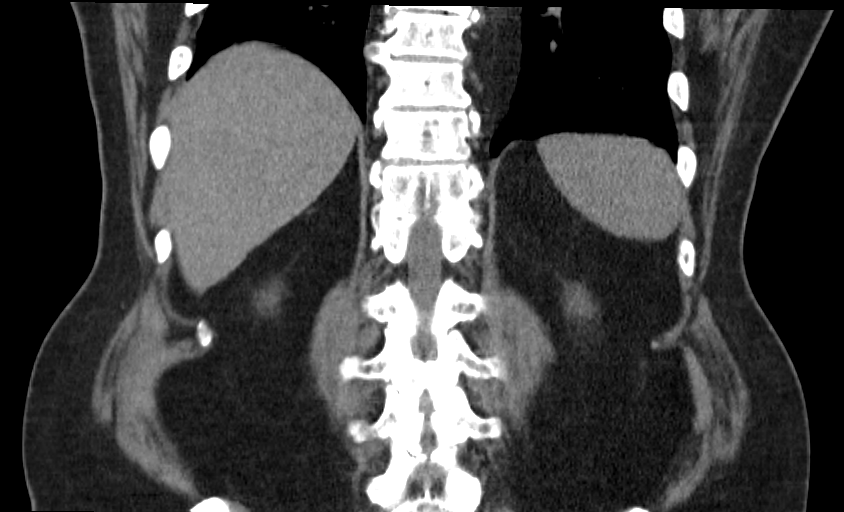

[Series 11: axial nephro · axial · 0.83mm/px · z∈[+872,+1241]mm · 7 of 165 slices shown, 12 images]
[im 21/165  soft-tissue]
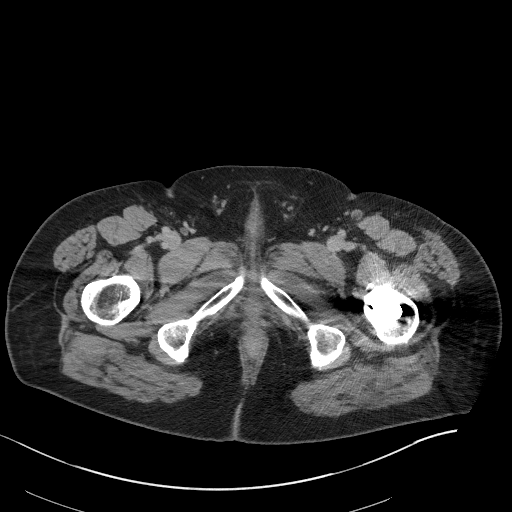
[im 21/165  bone]
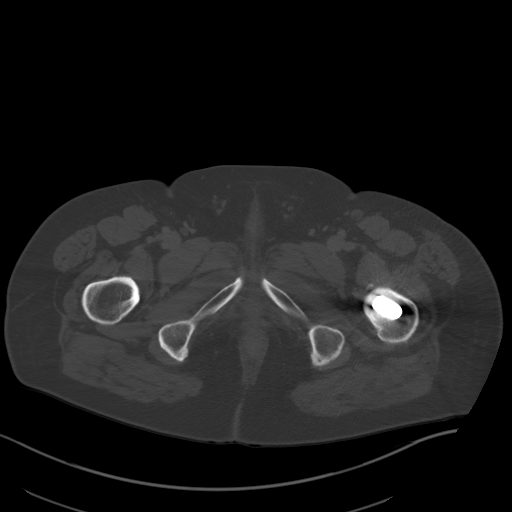
[im 42/165  soft-tissue]
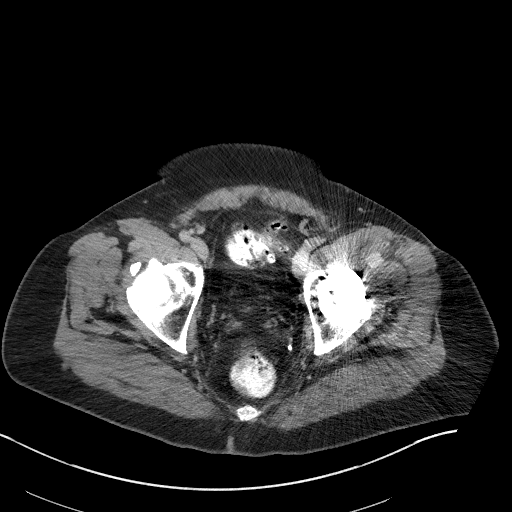
[im 62/165  soft-tissue]
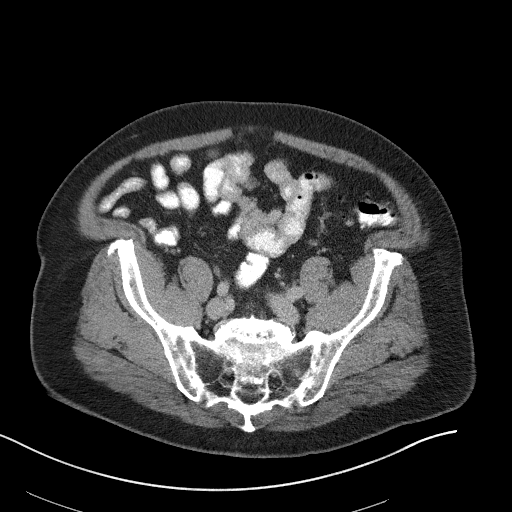
[im 83/165  soft-tissue]
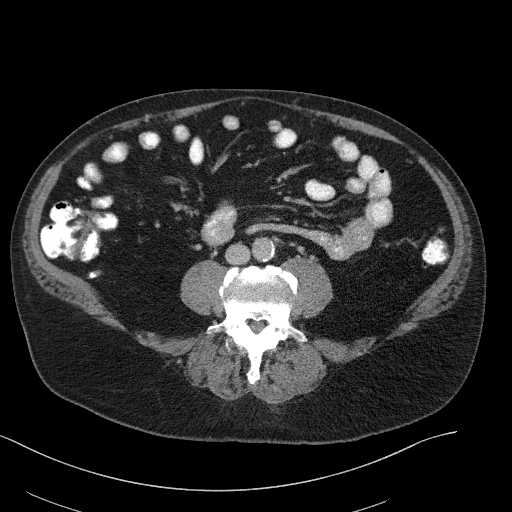
[im 83/165  lung]
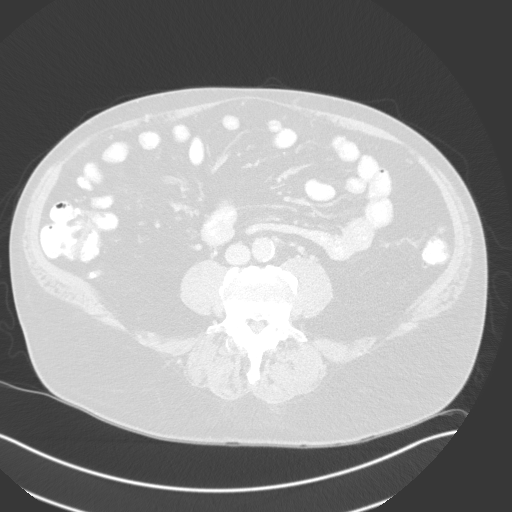
[im 103/165  soft-tissue]
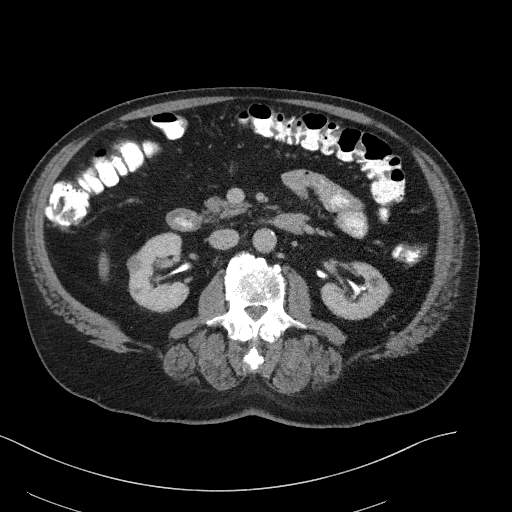
[im 103/165  lung]
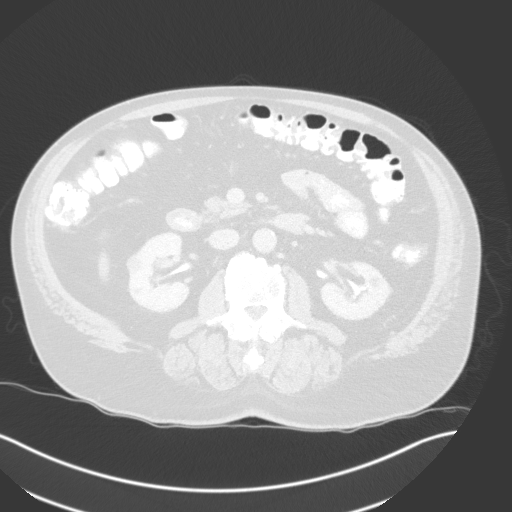
[im 124/165  soft-tissue]
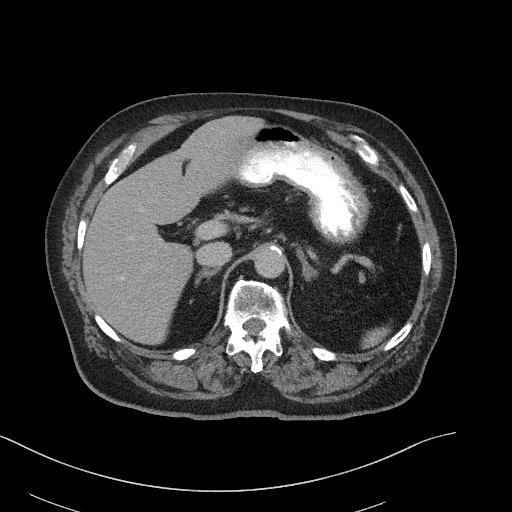
[im 124/165  lung]
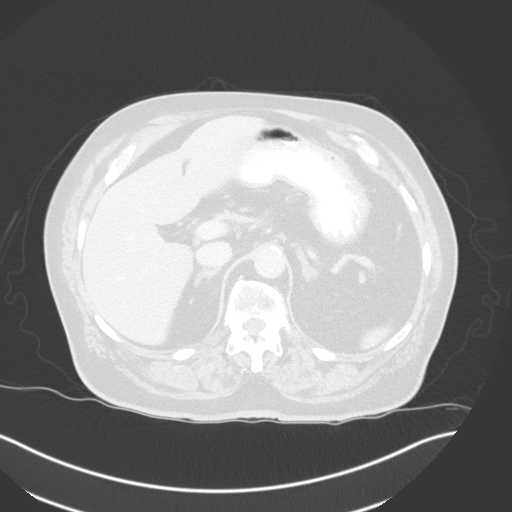
[im 144/165  soft-tissue]
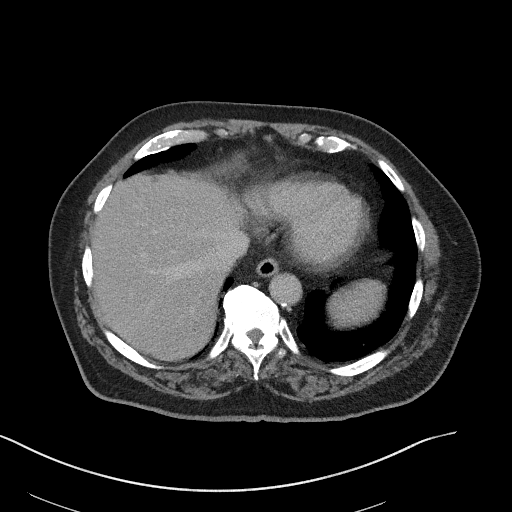
[im 144/165  lung]
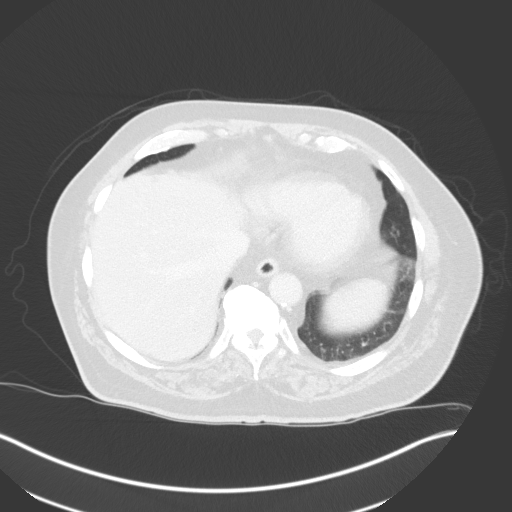

[9 of 46 positions shown; findings below may reference images not displayed]

RADIATION DOSE REDUCTION: This exam was performed according to the
departmental dose-optimization program which includes automated
exposure control, adjustment of the mA and/or kV according to
patient size and/or use of iterative reconstruction technique.

CONTRAST:  80mL OMNIPAQUE IOHEXOL 300 MG/ML  SOLN
FINDINGS: CT CHEST FINDINGS

Cardiovascular: No acute findings. Aortic and coronary
atherosclerotic calcification noted.

Mediastinum/Nodes: No masses or pathologically enlarged lymph nodes
identified on this unenhanced exam.

Lungs/Pleura: No suspicious nodules or masses identified. No
evidence of infiltrate or pleural effusion.

Musculoskeletal:  No suspicious bone lesions.

CT ABDOMEN AND PELVIS FINDINGS

Hepatobiliary: A few small hepatic cysts are stable. No hepatic
masses identified. Gallbladder is unremarkable. No evidence of
biliary ductal dilatation.

Pancreas:  No mass or inflammatory changes.

Spleen: Within normal limits in size and appearance.

Adrenals/Urinary Tract: Normal appearance the adrenal glands.
Probable tiny sub-cm cyst in midpole of left kidney remains stable.
A 1.1 cm subcapsular Bosniak category 2 cyst in the lateral midpole
of the right kidney remains stable. A subtle mass is again seen in
the lower pole of the right kidney which shows evidence of
heterogeneous contrast enhancement on subtraction imaging. This is
not as well visualized as on recent MR, but measures approximately
2.4 x 2.1 cm, without significant change. This remains suspicious
for renal cell carcinoma. No evidence of hydronephrosis.

Stomach/Bowel: No evidence of obstruction, inflammatory process or
abnormal fluid collections. Normal appendix visualized. Diffuse
colonic diverticulosis is again seen, without evidence of
diverticulitis.

Vascular/Lymphatic: No pathologically enlarged lymph nodes. No acute
vascular findings. Aortic atherosclerotic calcification noted.

Reproductive: Prior prostatectomy noted. Limited visualization due
to significant artifact from left hip prosthesis.

Other:  None.

Musculoskeletal:  No suspicious bone lesions identified.
IMPRESSION: 2.4 cm heterogeneously enhancing mass in lower pole of right kidney
is not as well visualized as on recent MR, but remains suspicious
for renal cell carcinoma.

No evidence of metastatic disease within the chest, abdomen, or
pelvis.

Colonic diverticulosis, without radiographic evidence of
diverticulitis.

Aortic Atherosclerosis ([id number]).

## 2023-06-27 ENCOUNTER — Other Ambulatory Visit: Payer: Self-pay | Admitting: Internal Medicine

## 2023-06-27 DIAGNOSIS — I10 Essential (primary) hypertension: Secondary | ICD-10-CM

## 2023-06-29 ENCOUNTER — Ambulatory Visit (INDEPENDENT_AMBULATORY_CARE_PROVIDER_SITE_OTHER): Payer: No Typology Code available for payment source | Admitting: Internal Medicine

## 2023-06-29 ENCOUNTER — Encounter: Payer: Self-pay | Admitting: Internal Medicine

## 2023-06-29 VITALS — BP 112/58 | HR 63 | Ht 68.0 in | Wt 206.6 lb

## 2023-06-29 DIAGNOSIS — E785 Hyperlipidemia, unspecified: Secondary | ICD-10-CM

## 2023-06-29 DIAGNOSIS — E1122 Type 2 diabetes mellitus with diabetic chronic kidney disease: Secondary | ICD-10-CM

## 2023-06-29 DIAGNOSIS — I10 Essential (primary) hypertension: Secondary | ICD-10-CM | POA: Diagnosis not present

## 2023-06-29 DIAGNOSIS — N1832 Chronic kidney disease, stage 3b: Secondary | ICD-10-CM

## 2023-06-29 DIAGNOSIS — E1169 Type 2 diabetes mellitus with other specified complication: Secondary | ICD-10-CM

## 2023-06-29 NOTE — Patient Instructions (Signed)
 It was a pleasure to see you today.  Thank you for giving us  the opportunity to be involved in your care.  Below is a brief recap of your visit and next steps.  We will plan to see you again in 4 months.  Summary No medication changes today Repeat labs ordered Follow up in 4 months

## 2023-06-29 NOTE — Progress Notes (Signed)
 Established Patient Office Visit  Subjective   Patient ID: Todd Gates, male    DOB: May 13, 1945  Age: 78 y.o. MRN: [id number]  Chief Complaint  Patient presents with   Hypertension    Four month follow up    Todd Gates returns to care today for routine follow-up.  He was last evaluated by me on 3/24 for an acute visit presenting to discuss poorly controlled hypertension.  HCTZ was added to his antihypertensive regimen at that time.  He was previously seen by me for routine follow-up in 1/6.  At that time he endorsed chest tightness and was referred to cardiology for evaluation.  Seen by cardiology 1/28 and completed a Lexi scan in early February.  The study was normal with no perfusion deficits identified.  There have otherwise been no acute interval events.  Todd Gates reports feeling well today has no acute concerns to discuss.  Past Medical History:  Diagnosis Date   Arthritis    Cancer [facility])    prostate; prostatectomy; radiation   Diabetes mellitus without complication (HCC)    TYPE 2   Fatty liver    Glaucoma    Both eyes   Gout    HTN (hypertension)    Peptic ulcer disease    Pneumonia    HX OF AS A CHILD   Sleep apnea    HAS CPAP   Past Surgical History:  Procedure Laterality Date   COLONOSCOPY W/ POLYPECTOMY  02/2020   PROSTATE SURGERY     PROSTATECTOMY  2008   ROBOTIC ASSITED PARTIAL NEPHRECTOMY Right 06/24/2021   Procedure: XI ROBOTIC ASSITED PARTIAL NEPHRECTOMY;  Surgeon: Adelbert Homans, MD;  Location: [facility];  Service: Urology;  Laterality: Right;   TONSILLECTOMY     TOTAL HIP ARTHROPLASTY Left 10/15/2020   Procedure: LEFT TOTAL HIP ARTHROPLASTY ANTERIOR APPROACH;  Surgeon: Arnie Lao, MD;  Location: [facility] OR;  Service: Orthopedics;  Laterality: Left;   Social History   Tobacco Use   Smoking status: Former    Types: Cigarettes   Smokeless tobacco: Never  Substance Use Topics   Alcohol  use: No   Drug use: No   Family History  Problem  Relation Age of Onset   Hypertension Unknown    Allergies  Allergen Reactions   Feraheme  [Ferumoxytol ] Swelling    Facial swelling   Review of Systems  Constitutional:  Negative for chills and fever.  HENT:  Negative for sore throat.   Respiratory:  Negative for cough and shortness of breath.   Cardiovascular:  Negative for chest pain, palpitations and leg swelling.  Gastrointestinal:  Negative for abdominal pain, blood in stool, constipation, diarrhea, nausea and vomiting.  Genitourinary:  Negative for dysuria and hematuria.  Musculoskeletal:  Negative for myalgias.  Skin:  Negative for itching and rash.  Neurological:  Negative for dizziness and headaches.  Psychiatric/Behavioral:  Negative for depression and suicidal ideas.      Objective:     BP (!) 112/58   Pulse 63   Ht 5\' 8"  (1.727 m)   Wt 206 lb 9.6 oz (93.7 kg)   SpO2 95%   BMI 31.41 kg/m  BP Readings from Last 3 Encounters:  06/29/23 (!) 112/58  05/17/23 (!) 140/72  03/23/23 134/72   Physical Exam Vitals reviewed.  Constitutional:      General: He is not in acute distress.    Appearance: Normal appearance. He is obese. He is not ill-appearing.  HENT:     Head: Normocephalic  and atraumatic.     Right Ear: External ear normal.     Left Ear: External ear normal.     Nose: Nose normal. No congestion or rhinorrhea.     Mouth/Throat:     Mouth: Mucous membranes are moist.     Pharynx: Oropharynx is clear.  Eyes:     General: No scleral icterus.    Extraocular Movements: Extraocular movements intact.     Conjunctiva/sclera: Conjunctivae normal.     Pupils: Pupils are equal, round, and reactive to light.  Cardiovascular:     Rate and Rhythm: Normal rate and regular rhythm.     Pulses: Normal pulses.     Heart sounds: Normal heart sounds. No murmur heard. Pulmonary:     Effort: Pulmonary effort is normal.     Breath sounds: Normal breath sounds. No wheezing, rhonchi or rales.  Abdominal:     General:  Abdomen is flat. Bowel sounds are normal. There is no distension.     Palpations: Abdomen is soft.     Tenderness: There is no abdominal tenderness.  Musculoskeletal:        General: No swelling or deformity. Normal range of motion.     Cervical back: Normal range of motion.  Skin:    General: Skin is warm and dry.     Capillary Refill: Capillary refill takes less than 2 seconds.  Neurological:     General: No focal deficit present.     Mental Status: He is alert and oriented to person, place, and time.     Motor: No weakness.  Psychiatric:        Mood and Affect: Mood normal.        Behavior: Behavior normal.        Thought Content: Thought content normal.   Last CBC Lab Results  Component Value Date   WBC 8.0 06/29/2023   HGB 11.6 (L) 06/29/2023   HCT 35.3 (L) 06/29/2023   MCV 93 06/29/2023   MCH 30.5 06/29/2023   RDW 13.1 06/29/2023   PLT 224 06/29/2023   Last metabolic panel Lab Results  Component Value Date   GLUCOSE 174 (H) 06/29/2023   NA 142 06/29/2023   K 4.0 06/29/2023   CL 106 06/29/2023   CO2 20 06/29/2023   BUN 32 (H) 06/29/2023   CREATININE 1.94 (H) 06/29/2023   EGFR 35 (L) 06/29/2023   CALCIUM  9.6 06/29/2023   PROT 6.9 06/29/2023   ALBUMIN 4.6 06/29/2023   LABGLOB 2.3 06/29/2023   AGRATIO 1.9 04/22/2022   BILITOT 0.3 06/29/2023   ALKPHOS 110 06/29/2023   AST 21 06/29/2023   ALT 11 06/29/2023   ANIONGAP 9 12/27/2021   Last lipids Lab Results  Component Value Date   CHOL 132 06/29/2023   HDL 46 06/29/2023   LDLCALC 57 06/29/2023   TRIG 171 (H) 06/29/2023   CHOLHDL 2.9 06/29/2023   Last hemoglobin A1c Lab Results  Component Value Date   HGBA1C 8.1 (H) 06/29/2023   Last thyroid  functions Lab Results  Component Value Date   TSH 1.160 03/01/2023   Last vitamin D  Lab Results  Component Value Date   VD25OH 26.4 (L) 03/01/2023   Last vitamin B12 and Folate Lab Results  Component Value Date   VITAMINB12 402 03/01/2023   FOLATE >20.0  03/01/2023   The 10-year ASCVD risk score (Arnett DK, et al., 2019) is: 30.7%    Assessment & Plan:   Problem List Items Addressed This Visit  Essential hypertension - Primary (Chronic)   Adequately controlled on current antihypertensive regimen consisting of HCTZ 12.5 mg daily, olmesartan  20 mg daily, and labetalol  100 mg twice daily.      Hyperlipidemia associated with type 2 diabetes mellitus (HCC) (Chronic)   Lipid panel updated in January.  Total cholesterol 207 and LDL 112.  Atorvastatin  was increased to 80 mg daily in light of this result.  Repeat lipid panel ordered today.      Type 2 diabetes mellitus with diabetic chronic kidney disease (HCC) (Chronic)   A1c 8.1 on labs from January.  He has started Ozempic as prescribed by the [place].  He is additionally prescribed Jardiance  25 mg daily.  Repeat A1c ordered today.      CKD (chronic kidney disease) stage 3, GFR 30-59 ml/min (HCC) (Chronic)   Followed by nephrology.  Currently on ARB and SGLT2i.  Repeat CMP ordered today.      Return in about 4 months (around 10/30/2023).   Tobi Fortes, MD

## 2023-06-30 ENCOUNTER — Encounter: Payer: Self-pay | Admitting: Internal Medicine

## 2023-06-30 LAB — LIPID PANEL
Chol/HDL Ratio: 2.9 ratio (ref 0.0–5.0)
Cholesterol, Total: 132 mg/dL (ref 100–199)
HDL: 46 mg/dL (ref >39)
LDL Chol Calc (NIH): 57 mg/dL (ref 0–99)
Triglycerides: 171 mg/dL — ABNORMAL HIGH (ref 0–149)
VLDL Cholesterol Cal: 29 mg/dL (ref 5–40)

## 2023-06-30 LAB — CBC WITH DIFFERENTIAL/PLATELET
Basophils Absolute: 0.1 10*3/uL (ref 0.0–0.2)
Basos: 1 %
EOS (ABSOLUTE): 0.5 10*3/uL — ABNORMAL HIGH (ref 0.0–0.4)
Eos: 6 %
Hematocrit: 35.3 % — ABNORMAL LOW (ref 37.5–51.0)
Hemoglobin: 11.6 g/dL — ABNORMAL LOW (ref 13.0–17.7)
Immature Grans (Abs): 0 10*3/uL (ref 0.0–0.1)
Immature Granulocytes: 0 %
Lymphocytes Absolute: 2.3 10*3/uL (ref 0.7–3.1)
Lymphs: 28 %
MCH: 30.5 pg (ref 26.6–33.0)
MCHC: 32.9 g/dL (ref 31.5–35.7)
MCV: 93 fL (ref 79–97)
Monocytes Absolute: 0.7 10*3/uL (ref 0.1–0.9)
Monocytes: 9 %
Neutrophils Absolute: 4.4 10*3/uL (ref 1.4–7.0)
Neutrophils: 56 %
Platelets: 224 10*3/uL (ref 150–450)
RBC: 3.8 x10E6/uL — ABNORMAL LOW (ref 4.14–5.80)
RDW: 13.1 % (ref 11.6–15.4)
WBC: 8 10*3/uL (ref 3.4–10.8)

## 2023-06-30 LAB — CMP14+EGFR
ALT: 11 IU/L (ref 0–44)
AST: 21 IU/L (ref 0–40)
Albumin: 4.6 g/dL (ref 3.8–4.8)
Alkaline Phosphatase: 110 IU/L (ref 44–121)
BUN/Creatinine Ratio: 16 (ref 10–24)
BUN: 32 mg/dL — ABNORMAL HIGH (ref 8–27)
Bilirubin Total: 0.3 mg/dL (ref 0.0–1.2)
CO2: 20 mmol/L (ref 20–29)
Calcium: 9.6 mg/dL (ref 8.6–10.2)
Chloride: 106 mmol/L (ref 96–106)
Creatinine, Ser: 1.94 mg/dL — ABNORMAL HIGH (ref 0.76–1.27)
Globulin, Total: 2.3 g/dL (ref 1.5–4.5)
Glucose: 174 mg/dL — ABNORMAL HIGH (ref 70–99)
Potassium: 4 mmol/L (ref 3.5–5.2)
Sodium: 142 mmol/L (ref 134–144)
Total Protein: 6.9 g/dL (ref 6.0–8.5)
eGFR: 35 mL/min/{1.73_m2} — ABNORMAL LOW (ref >59)

## 2023-06-30 LAB — HEMOGLOBIN A1C
Est. average glucose Bld gHb Est-mCnc: 186 mg/dL
Hgb A1c MFr Bld: 8.1 % — ABNORMAL HIGH (ref 4.8–5.6)

## 2023-07-14 DIAGNOSIS — M47816 Spondylosis without myelopathy or radiculopathy, lumbar region: Secondary | ICD-10-CM | POA: Diagnosis not present

## 2023-07-14 DIAGNOSIS — M9903 Segmental and somatic dysfunction of lumbar region: Secondary | ICD-10-CM | POA: Diagnosis not present

## 2023-07-14 DIAGNOSIS — M9902 Segmental and somatic dysfunction of thoracic region: Secondary | ICD-10-CM | POA: Diagnosis not present

## 2023-07-14 DIAGNOSIS — S233XXA Sprain of ligaments of thoracic spine, initial encounter: Secondary | ICD-10-CM | POA: Diagnosis not present

## 2023-07-16 DIAGNOSIS — M9903 Segmental and somatic dysfunction of lumbar region: Secondary | ICD-10-CM | POA: Diagnosis not present

## 2023-07-16 DIAGNOSIS — S233XXA Sprain of ligaments of thoracic spine, initial encounter: Secondary | ICD-10-CM | POA: Diagnosis not present

## 2023-07-16 DIAGNOSIS — M47816 Spondylosis without myelopathy or radiculopathy, lumbar region: Secondary | ICD-10-CM | POA: Diagnosis not present

## 2023-07-16 DIAGNOSIS — M9902 Segmental and somatic dysfunction of thoracic region: Secondary | ICD-10-CM | POA: Diagnosis not present

## 2023-07-23 DIAGNOSIS — M9903 Segmental and somatic dysfunction of lumbar region: Secondary | ICD-10-CM | POA: Diagnosis not present

## 2023-07-23 DIAGNOSIS — M47816 Spondylosis without myelopathy or radiculopathy, lumbar region: Secondary | ICD-10-CM | POA: Diagnosis not present

## 2023-07-23 DIAGNOSIS — M9902 Segmental and somatic dysfunction of thoracic region: Secondary | ICD-10-CM | POA: Diagnosis not present

## 2023-07-23 DIAGNOSIS — S233XXA Sprain of ligaments of thoracic spine, initial encounter: Secondary | ICD-10-CM | POA: Diagnosis not present

## 2023-07-26 DIAGNOSIS — B351 Tinea unguium: Secondary | ICD-10-CM | POA: Diagnosis not present

## 2023-07-26 DIAGNOSIS — L851 Acquired keratosis [keratoderma] palmaris et plantaris: Secondary | ICD-10-CM | POA: Diagnosis not present

## 2023-07-26 DIAGNOSIS — E1142 Type 2 diabetes mellitus with diabetic polyneuropathy: Secondary | ICD-10-CM | POA: Diagnosis not present

## 2023-07-27 ENCOUNTER — Encounter: Payer: Self-pay | Admitting: Internal Medicine

## 2023-07-27 DIAGNOSIS — M47816 Spondylosis without myelopathy or radiculopathy, lumbar region: Secondary | ICD-10-CM | POA: Diagnosis not present

## 2023-07-27 DIAGNOSIS — M9902 Segmental and somatic dysfunction of thoracic region: Secondary | ICD-10-CM | POA: Diagnosis not present

## 2023-07-27 DIAGNOSIS — S233XXA Sprain of ligaments of thoracic spine, initial encounter: Secondary | ICD-10-CM | POA: Diagnosis not present

## 2023-07-27 DIAGNOSIS — M9903 Segmental and somatic dysfunction of lumbar region: Secondary | ICD-10-CM | POA: Diagnosis not present

## 2023-07-27 NOTE — Assessment & Plan Note (Signed)
 Lipid panel updated in January.  Total cholesterol 207 and LDL 112.  Atorvastatin  was increased to 80 mg daily in light of this result.  Repeat lipid panel ordered today.

## 2023-07-27 NOTE — Assessment & Plan Note (Signed)
 Adequately controlled on current antihypertensive regimen consisting of HCTZ 12.5 mg daily, olmesartan  20 mg daily, and labetalol  100 mg twice daily.

## 2023-07-27 NOTE — Assessment & Plan Note (Signed)
 Followed by nephrology.  Currently on ARB and SGLT2i.  Repeat CMP ordered today.

## 2023-07-27 NOTE — Assessment & Plan Note (Signed)
 A1c 8.1 on labs from January.  He has started Ozempic as prescribed by the [place].  He is additionally prescribed Jardiance  25 mg daily.  Repeat A1c ordered today.

## 2023-08-05 DIAGNOSIS — M9903 Segmental and somatic dysfunction of lumbar region: Secondary | ICD-10-CM | POA: Diagnosis not present

## 2023-08-05 DIAGNOSIS — M9902 Segmental and somatic dysfunction of thoracic region: Secondary | ICD-10-CM | POA: Diagnosis not present

## 2023-08-05 DIAGNOSIS — S233XXA Sprain of ligaments of thoracic spine, initial encounter: Secondary | ICD-10-CM | POA: Diagnosis not present

## 2023-08-05 DIAGNOSIS — M47816 Spondylosis without myelopathy or radiculopathy, lumbar region: Secondary | ICD-10-CM | POA: Diagnosis not present

## 2023-08-12 ENCOUNTER — Other Ambulatory Visit: Payer: Self-pay | Admitting: Internal Medicine

## 2023-08-12 DIAGNOSIS — M9903 Segmental and somatic dysfunction of lumbar region: Secondary | ICD-10-CM | POA: Diagnosis not present

## 2023-08-12 DIAGNOSIS — M9902 Segmental and somatic dysfunction of thoracic region: Secondary | ICD-10-CM | POA: Diagnosis not present

## 2023-08-12 DIAGNOSIS — I10 Essential (primary) hypertension: Secondary | ICD-10-CM

## 2023-08-12 DIAGNOSIS — M47816 Spondylosis without myelopathy or radiculopathy, lumbar region: Secondary | ICD-10-CM | POA: Diagnosis not present

## 2023-08-12 DIAGNOSIS — S233XXA Sprain of ligaments of thoracic spine, initial encounter: Secondary | ICD-10-CM | POA: Diagnosis not present

## 2023-08-19 DIAGNOSIS — M9902 Segmental and somatic dysfunction of thoracic region: Secondary | ICD-10-CM | POA: Diagnosis not present

## 2023-08-19 DIAGNOSIS — M9903 Segmental and somatic dysfunction of lumbar region: Secondary | ICD-10-CM | POA: Diagnosis not present

## 2023-08-19 DIAGNOSIS — M47816 Spondylosis without myelopathy or radiculopathy, lumbar region: Secondary | ICD-10-CM | POA: Diagnosis not present

## 2023-08-19 DIAGNOSIS — S233XXA Sprain of ligaments of thoracic spine, initial encounter: Secondary | ICD-10-CM | POA: Diagnosis not present

## 2023-08-23 ENCOUNTER — Other Ambulatory Visit (HOSPITAL_COMMUNITY): Payer: Self-pay | Admitting: Urology

## 2023-08-23 DIAGNOSIS — C641 Malignant neoplasm of right kidney, except renal pelvis: Secondary | ICD-10-CM

## 2023-08-25 DIAGNOSIS — M9902 Segmental and somatic dysfunction of thoracic region: Secondary | ICD-10-CM | POA: Diagnosis not present

## 2023-08-25 DIAGNOSIS — M9903 Segmental and somatic dysfunction of lumbar region: Secondary | ICD-10-CM | POA: Diagnosis not present

## 2023-08-25 DIAGNOSIS — M47816 Spondylosis without myelopathy or radiculopathy, lumbar region: Secondary | ICD-10-CM | POA: Diagnosis not present

## 2023-08-25 DIAGNOSIS — S233XXA Sprain of ligaments of thoracic spine, initial encounter: Secondary | ICD-10-CM | POA: Diagnosis not present

## 2023-09-02 DIAGNOSIS — M47816 Spondylosis without myelopathy or radiculopathy, lumbar region: Secondary | ICD-10-CM | POA: Diagnosis not present

## 2023-09-02 DIAGNOSIS — S233XXA Sprain of ligaments of thoracic spine, initial encounter: Secondary | ICD-10-CM | POA: Diagnosis not present

## 2023-09-02 DIAGNOSIS — M9902 Segmental and somatic dysfunction of thoracic region: Secondary | ICD-10-CM | POA: Diagnosis not present

## 2023-09-02 DIAGNOSIS — M9903 Segmental and somatic dysfunction of lumbar region: Secondary | ICD-10-CM | POA: Diagnosis not present

## 2023-09-08 DIAGNOSIS — M47816 Spondylosis without myelopathy or radiculopathy, lumbar region: Secondary | ICD-10-CM | POA: Diagnosis not present

## 2023-09-08 DIAGNOSIS — S233XXA Sprain of ligaments of thoracic spine, initial encounter: Secondary | ICD-10-CM | POA: Diagnosis not present

## 2023-09-08 DIAGNOSIS — M9902 Segmental and somatic dysfunction of thoracic region: Secondary | ICD-10-CM | POA: Diagnosis not present

## 2023-09-08 DIAGNOSIS — M9903 Segmental and somatic dysfunction of lumbar region: Secondary | ICD-10-CM | POA: Diagnosis not present

## 2023-09-16 DIAGNOSIS — M9903 Segmental and somatic dysfunction of lumbar region: Secondary | ICD-10-CM | POA: Diagnosis not present

## 2023-09-16 DIAGNOSIS — M47816 Spondylosis without myelopathy or radiculopathy, lumbar region: Secondary | ICD-10-CM | POA: Diagnosis not present

## 2023-09-16 DIAGNOSIS — S233XXA Sprain of ligaments of thoracic spine, initial encounter: Secondary | ICD-10-CM | POA: Diagnosis not present

## 2023-09-16 DIAGNOSIS — M9902 Segmental and somatic dysfunction of thoracic region: Secondary | ICD-10-CM | POA: Diagnosis not present

## 2023-09-27 DIAGNOSIS — S233XXA Sprain of ligaments of thoracic spine, initial encounter: Secondary | ICD-10-CM | POA: Diagnosis not present

## 2023-09-27 DIAGNOSIS — M9902 Segmental and somatic dysfunction of thoracic region: Secondary | ICD-10-CM | POA: Diagnosis not present

## 2023-09-27 DIAGNOSIS — M47816 Spondylosis without myelopathy or radiculopathy, lumbar region: Secondary | ICD-10-CM | POA: Diagnosis not present

## 2023-09-27 DIAGNOSIS — M9903 Segmental and somatic dysfunction of lumbar region: Secondary | ICD-10-CM | POA: Diagnosis not present

## 2023-10-04 DIAGNOSIS — E1142 Type 2 diabetes mellitus with diabetic polyneuropathy: Secondary | ICD-10-CM | POA: Diagnosis not present

## 2023-10-04 DIAGNOSIS — L851 Acquired keratosis [keratoderma] palmaris et plantaris: Secondary | ICD-10-CM | POA: Diagnosis not present

## 2023-10-04 DIAGNOSIS — B351 Tinea unguium: Secondary | ICD-10-CM | POA: Diagnosis not present

## 2023-10-04 DIAGNOSIS — M47816 Spondylosis without myelopathy or radiculopathy, lumbar region: Secondary | ICD-10-CM | POA: Diagnosis not present

## 2023-10-04 DIAGNOSIS — S233XXA Sprain of ligaments of thoracic spine, initial encounter: Secondary | ICD-10-CM | POA: Diagnosis not present

## 2023-10-04 DIAGNOSIS — M9903 Segmental and somatic dysfunction of lumbar region: Secondary | ICD-10-CM | POA: Diagnosis not present

## 2023-10-04 DIAGNOSIS — M9902 Segmental and somatic dysfunction of thoracic region: Secondary | ICD-10-CM | POA: Diagnosis not present

## 2023-11-01 ENCOUNTER — Encounter (HOSPITAL_COMMUNITY): Payer: Self-pay | Admitting: Urology

## 2023-11-02 ENCOUNTER — Ambulatory Visit (INDEPENDENT_AMBULATORY_CARE_PROVIDER_SITE_OTHER)

## 2023-11-02 VITALS — BP 160/67 | HR 34 | Ht 68.0 in | Wt 206.0 lb

## 2023-11-02 DIAGNOSIS — E1122 Type 2 diabetes mellitus with diabetic chronic kidney disease: Secondary | ICD-10-CM | POA: Diagnosis not present

## 2023-11-02 DIAGNOSIS — Z23 Encounter for immunization: Secondary | ICD-10-CM | POA: Diagnosis not present

## 2023-11-02 DIAGNOSIS — I10 Essential (primary) hypertension: Secondary | ICD-10-CM | POA: Diagnosis not present

## 2023-11-02 DIAGNOSIS — N1832 Chronic kidney disease, stage 3b: Secondary | ICD-10-CM

## 2023-11-02 MED ORDER — OLMESARTAN MEDOXOMIL 20 MG PO TABS: 20.0000 mg | ORAL_TABLET | Freq: Every day | ORAL | 3 refills | Status: DC

## 2023-11-02 NOTE — Progress Notes (Unsigned)
 Established Patient Office Visit  Subjective   Patient ID: Todd Gates, male    DOB: [birth year]  Age: 78 y.o. MRN: [id number]  Chief Complaint  Patient presents with   Medical Management of Chronic Issues    Follow up    HPI Discussed the use of AI scribe software for clinical note transcription with the patient, who gave verbal consent to proceed.  History of Present Illness   Todd Gates is a 78 year old male with hypertension who presents for a follow-up visit.  Hypertension - Blood pressure measured at 139 mmHg at home recently - Blood pressure was low during last clinic visit - Takes antihypertensive medication daily by cutting a pill in half  Type 2 diabetes mellitus - On Ozempic 0.5 mg for the past two months - No significant effect on weight observed  History of renal neoplasm - Underwent nephrectomy for kidney cancer two years ago - Monitored every four months for kidney function  Neuropathic pain - Takes gabapentin  for nerve pain - One 90-day refill remaining  Glaucoma and ocular health - Intraocular pressure reduced to 9 mmHg in each eye - Recently received prescription for new glasses - Comprehensive eye exam including retinal evaluation completed - Advised that cataract surgery may be needed in approximately three years  Chest pain - Occasional chest pain - Two EKGs performed (one at [facility], one at current clinic), both negative for acute findings  Physical activity - Regular gym attendance  Immunization counseling - Advised by a physician in [place] that annual RSV vaccination may be beneficial, especially due to anticipated contact with grandchildren during holidays      Patient Active Problem List   Diagnosis Date Noted   Hypertriglyceridemia 03/23/2023   Sinus bradycardia 03/23/2023   Chest tightness 03/01/2023   Peripheral neuropathy 01/08/2023   Essential hypertension 04/22/2022   History of prostate cancer 04/22/2022   Gout 04/22/2022    Hyperlipidemia associated with type 2 diabetes mellitus (HCC) 04/22/2022   Type 2 diabetes mellitus with diabetic chronic kidney disease (HCC) 04/22/2022   CKD (chronic kidney disease) stage 3, GFR 30-59 ml/min (HCC) 04/22/2022   PUD (peptic ulcer disease) 04/22/2022   History of iron  deficiency anemia 04/22/2022   OSA on CPAP 04/22/2022   Bilateral open angle glaucoma 04/22/2022   Encounter for general adult medical examination with abnormal findings 04/22/2022   Renal mass 06/24/2021   Iron  deficiency anemia 03/31/2021   Right kidney mass 03/03/2021   Normocytic anemia 03/03/2021   Unilateral primary osteoarthritis, left hip 10/15/2020   Status post left hip replacement 10/15/2020   Achilles tendon contracture, left 02/21/2016   Plantar fasciitis 01/23/2016   Primary osteoarthritis of left knee 07/13/2013   ROS    Objective:     BP (!) 160/67   Pulse (!) 34   Ht 5' 8 (1.727 m)   Wt 206 lb (93.4 kg)   SpO2 98%   BMI 31.32 kg/m  BP Readings from Last 3 Encounters:  11/02/23 (!) 160/67  06/29/23 (!) 112/58  05/17/23 (!) 140/72   Wt Readings from Last 3 Encounters:  11/02/23 206 lb (93.4 kg)  06/29/23 206 lb 9.6 oz (93.7 kg)  06/14/23 204 lb (92.5 kg)     Physical Exam Vitals and nursing note reviewed.  Constitutional:      Appearance: Normal appearance.  HENT:     Head: Normocephalic.     Right Ear: Tympanic membrane, ear canal and external ear normal.  Left Ear: Tympanic membrane, ear canal and external ear normal.     Nose: Nose normal.     Mouth/Throat:     Mouth: Mucous membranes are moist.     Pharynx: Oropharynx is clear.  Eyes:     Extraocular Movements: Extraocular movements intact.     Pupils: Pupils are equal, round, and reactive to light.  Cardiovascular:     Rate and Rhythm: Normal rate and regular rhythm.  Pulmonary:     Effort: Pulmonary effort is normal.     Breath sounds: Normal breath sounds.  Abdominal:     General: Bowel sounds are  normal.     Palpations: Abdomen is soft.  Musculoskeletal:     Cervical back: Normal range of motion and neck supple.  Skin:    General: Skin is warm and dry.  Neurological:     Mental Status: He is alert and oriented to person, place, and time.  Psychiatric:        Mood and Affect: Mood normal.        Thought Content: Thought content normal.       Last CBC Lab Results  Component Value Date   WBC 8.0 06/29/2023   HGB 11.6 (L) 06/29/2023   HCT 35.3 (L) 06/29/2023   MCV 93 06/29/2023   MCH 30.5 06/29/2023   RDW 13.1 06/29/2023   PLT 224 06/29/2023   Last metabolic panel Lab Results  Component Value Date   GLUCOSE 127 (H) 11/02/2023   NA 145 (H) 11/02/2023   K 4.2 11/02/2023   CL 107 (H) 11/02/2023   CO2 20 11/02/2023   BUN 32 (H) 11/02/2023   CREATININE 2.14 (H) 11/02/2023   EGFR 31 (L) 11/02/2023   CALCIUM  9.9 11/02/2023   PROT 7.2 11/02/2023   ALBUMIN 4.6 11/02/2023   LABGLOB 2.6 11/02/2023   AGRATIO 1.9 04/22/2022   BILITOT 0.5 11/02/2023   ALKPHOS 100 11/02/2023   AST 18 11/02/2023   ALT 12 11/02/2023   ANIONGAP 9 12/27/2021   Last lipids Lab Results  Component Value Date   CHOL 132 06/29/2023   HDL 46 06/29/2023   LDLCALC 57 06/29/2023   TRIG 171 (H) 06/29/2023   CHOLHDL 2.9 06/29/2023   Last hemoglobin A1c Lab Results  Component Value Date   HGBA1C 7.8 (H) 11/02/2023   Last thyroid  functions Lab Results  Component Value Date   TSH 1.160 03/01/2023   Last vitamin D  Lab Results  Component Value Date   VD25OH 26.4 (L) 03/01/2023   Last vitamin B12 and Folate Lab Results  Component Value Date   VITAMINB12 402 03/01/2023   FOLATE >20.0 03/01/2023      The 10-year ASCVD risk score (Arnett DK, et al., 2019) is: 51.7%    Assessment & Plan:   Problem List Items Addressed This Visit       Cardiovascular and Mediastinum   Essential hypertension (Chronic)   Blood pressure variability noted with home readings at 139 mmHg and office  measurement at 116/58 mmHg, suggesting possible measurement technique or situational factors. - Monitor blood pressure at home. - Reassure regarding variability in blood pressure readings. Continue with current dose of Benicar  20 mg.  Refills provided.       Relevant Medications   olmesartan  (BENICAR ) 20 MG tablet     Endocrine   Type 2 diabetes mellitus with diabetic chronic kidney disease (HCC) - Primary (Chronic)   A1c 8.1 on labs from May.  He has started Ozempic as prescribed  by the [place].  He is additionally prescribed Jardiance  25 mg daily.  Repeat A1c ordered today.        Relevant Medications   olmesartan  (BENICAR ) 20 MG tablet   Other Relevant Orders   CMP14+EGFR (Completed)   Hemoglobin A1c (Completed)   Microalbumin / creatinine urine ratio (Completed)     Genitourinary   CKD (chronic kidney disease) stage 3, GFR 30-59 ml/min (HCC) (Chronic)   Followed by nephrology.  Currently on ARB and SGLT2i.  Repeat CMP and microalbumin ordered today.      Relevant Orders   Microalbumin / creatinine urine ratio (Completed)   Other Visit Diagnoses       Need for influenza vaccination       Relevant Orders   Flu vaccine HIGH DOSE PF(Fluzone Trivalent) (Completed)     Assessment and Plan     Essential hypertension   Type 2 diabetes mellitus with diabetic chronic kidney disease, stage 3b Chronic kidney disease stage 3b secondary to diabetes. Regular monitoring of kidney function is ongoing. Gabapentin  is used for neuropathy management. - Continue monitoring kidney function. - Refill gabapentin  for neuropathy with a year's worth of refills.  History of kidney cancer Previous surgery two years ago with ongoing regular monitoring every four months. - Continue regular monitoring every four months.       Return in about 6 months (around 05/01/2024) for chronic follow-up with PCP.    Leita Longs, FNP

## 2023-11-04 LAB — CMP14+EGFR
ALT: 12 IU/L (ref 0–44)
AST: 18 IU/L (ref 0–40)
Albumin: 4.6 g/dL (ref 3.8–4.8)
Alkaline Phosphatase: 100 IU/L (ref 44–121)
BUN/Creatinine Ratio: 15 (ref 10–24)
BUN: 32 mg/dL — ABNORMAL HIGH (ref 8–27)
Bilirubin Total: 0.5 mg/dL (ref 0.0–1.2)
CO2: 20 mmol/L (ref 20–29)
Calcium: 9.9 mg/dL (ref 8.6–10.2)
Chloride: 107 mmol/L — ABNORMAL HIGH (ref 96–106)
Creatinine, Ser: 2.14 mg/dL — ABNORMAL HIGH (ref 0.76–1.27)
Globulin, Total: 2.6 g/dL (ref 1.5–4.5)
Glucose: 127 mg/dL — ABNORMAL HIGH (ref 70–99)
Potassium: 4.2 mmol/L (ref 3.5–5.2)
Sodium: 145 mmol/L — ABNORMAL HIGH (ref 134–144)
Total Protein: 7.2 g/dL (ref 6.0–8.5)
eGFR: 31 mL/min/1.73 — ABNORMAL LOW (ref >59)

## 2023-11-04 LAB — MICROALBUMIN / CREATININE URINE RATIO
Creatinine, Urine: 61.3 mg/dL
Microalb/Creat Ratio: 18 mg/g{creat} (ref 0–29)
Microalbumin, Urine: 11.3 ug/mL

## 2023-11-04 LAB — HEMOGLOBIN A1C
Est. average glucose Bld gHb Est-mCnc: 177 mg/dL
Hgb A1c MFr Bld: 7.8 % — ABNORMAL HIGH (ref 4.8–5.6)

## 2023-11-07 ENCOUNTER — Ambulatory Visit: Payer: Self-pay

## 2023-11-07 NOTE — Assessment & Plan Note (Signed)
 Blood pressure variability noted with home readings at 139 mmHg and office measurement at 116/58 mmHg, suggesting possible measurement technique or situational factors. - Monitor blood pressure at home. - Reassure regarding variability in blood pressure readings. Continue with current dose of Benicar  20 mg.  Refills provided.

## 2023-11-07 NOTE — Assessment & Plan Note (Signed)
 Followed by nephrology.  Currently on ARB and SGLT2i.  Repeat CMP and microalbumin ordered today.

## 2023-11-07 NOTE — Assessment & Plan Note (Signed)
 A1c 8.1 on labs from May.  He has started Ozempic as prescribed by the [place].  He is additionally prescribed Jardiance  25 mg daily.  Repeat A1c ordered today.

## 2023-11-08 ENCOUNTER — Other Ambulatory Visit

## 2023-11-09 ENCOUNTER — Ambulatory Visit (HOSPITAL_COMMUNITY)
Admission: RE | Admit: 2023-11-09 | Discharge: 2023-11-09 | Disposition: A | Discharge status: Home / Self Care | Source: Ambulatory Visit | Attending: Urology | Admitting: Urology

## 2023-11-09 DIAGNOSIS — C641 Malignant neoplasm of right kidney, except renal pelvis: Secondary | ICD-10-CM | POA: Insufficient documentation

## 2023-11-09 DIAGNOSIS — N281 Cyst of kidney, acquired: Secondary | ICD-10-CM | POA: Diagnosis not present

## 2023-11-09 MED ORDER — GADOBUTROL 1 MMOL/ML IV SOLN
9.0000 mL | Freq: Once | INTRAVENOUS | Status: AC | PRN
  Administered 2023-11-09: 9 mL via INTRAVENOUS

## 2023-11-11 DIAGNOSIS — S233XXA Sprain of ligaments of thoracic spine, initial encounter: Secondary | ICD-10-CM | POA: Diagnosis not present

## 2023-11-11 DIAGNOSIS — M9902 Segmental and somatic dysfunction of thoracic region: Secondary | ICD-10-CM | POA: Diagnosis not present

## 2023-11-11 DIAGNOSIS — M47816 Spondylosis without myelopathy or radiculopathy, lumbar region: Secondary | ICD-10-CM | POA: Diagnosis not present

## 2023-11-11 DIAGNOSIS — M9903 Segmental and somatic dysfunction of lumbar region: Secondary | ICD-10-CM | POA: Diagnosis not present

## 2023-11-15 ENCOUNTER — Other Ambulatory Visit (HOSPITAL_COMMUNITY): Payer: Self-pay | Admitting: Urology

## 2023-11-15 ENCOUNTER — Ambulatory Visit (HOSPITAL_COMMUNITY)
Admission: RE | Admit: 2023-11-15 | Discharge: 2023-11-15 | Disposition: A | Discharge status: Home / Self Care | Source: Ambulatory Visit | Attending: Urology | Admitting: Urology

## 2023-11-15 DIAGNOSIS — Z1289 Encounter for screening for malignant neoplasm of other sites: Secondary | ICD-10-CM | POA: Diagnosis not present

## 2023-11-15 DIAGNOSIS — C641 Malignant neoplasm of right kidney, except renal pelvis: Secondary | ICD-10-CM

## 2023-12-13 DIAGNOSIS — M2012 Hallux valgus (acquired), left foot: Secondary | ICD-10-CM | POA: Diagnosis not present

## 2023-12-13 DIAGNOSIS — B351 Tinea unguium: Secondary | ICD-10-CM | POA: Diagnosis not present

## 2023-12-13 DIAGNOSIS — E1142 Type 2 diabetes mellitus with diabetic polyneuropathy: Secondary | ICD-10-CM | POA: Diagnosis not present

## 2023-12-13 DIAGNOSIS — L851 Acquired keratosis [keratoderma] palmaris et plantaris: Secondary | ICD-10-CM | POA: Diagnosis not present

## 2023-12-27 ENCOUNTER — Other Ambulatory Visit: Payer: Self-pay | Admitting: Internal Medicine

## 2023-12-27 ENCOUNTER — Encounter: Payer: Self-pay | Admitting: Radiology

## 2023-12-27 DIAGNOSIS — I10 Essential (primary) hypertension: Secondary | ICD-10-CM

## 2024-01-27 ENCOUNTER — Other Ambulatory Visit: Payer: Self-pay | Admitting: Internal Medicine

## 2024-01-27 DIAGNOSIS — G6289 Other specified polyneuropathies: Secondary | ICD-10-CM

## 2024-02-07 ENCOUNTER — Other Ambulatory Visit: Payer: Self-pay

## 2024-02-07 DIAGNOSIS — I10 Essential (primary) hypertension: Secondary | ICD-10-CM

## 2024-02-12 ENCOUNTER — Other Ambulatory Visit: Payer: Self-pay | Admitting: Internal Medicine

## 2024-02-12 DIAGNOSIS — E1169 Type 2 diabetes mellitus with other specified complication: Secondary | ICD-10-CM

## 2024-03-01 ENCOUNTER — Other Ambulatory Visit: Payer: Self-pay | Admitting: Internal Medicine

## 2024-03-28 ENCOUNTER — Encounter: Payer: Self-pay | Admitting: *Deleted

## 2024-03-28 NOTE — Progress Notes (Signed)
 Todd Gates                                          MRN: [id number]   03/28/2024   The [facility] Quality Team Specialist reviewed this patient medical record for the purposes of chart review for care gap closure. The following were reviewed: abstraction for care gap closure-controlling blood pressure.    [facility] Quality Team

## 2024-04-10 ENCOUNTER — Ambulatory Visit: Admitting: Internal Medicine

## 2024-05-01 ENCOUNTER — Ambulatory Visit

## 2024-06-14 ENCOUNTER — Ambulatory Visit
